# Patient Record
Sex: Male | Born: 1945 | Race: White | Hispanic: No | Marital: Married | Smoking: Former smoker
Health system: Southern US, Community
[De-identification: ages and names within clinical notes are randomized; demographics above are authoritative.]

## PROBLEM LIST (undated history)

## (undated) DIAGNOSIS — I639 Cerebral infarction, unspecified: Secondary | ICD-10-CM

## (undated) DIAGNOSIS — I10 Essential (primary) hypertension: Secondary | ICD-10-CM

## (undated) DIAGNOSIS — Z87442 Personal history of urinary calculi: Secondary | ICD-10-CM

## (undated) DIAGNOSIS — K869 Disease of pancreas, unspecified: Secondary | ICD-10-CM

## (undated) DIAGNOSIS — Z9181 History of falling: Secondary | ICD-10-CM

## (undated) DIAGNOSIS — K219 Gastro-esophageal reflux disease without esophagitis: Secondary | ICD-10-CM

## (undated) DIAGNOSIS — E119 Type 2 diabetes mellitus without complications: Secondary | ICD-10-CM

## (undated) DIAGNOSIS — I6330 Cerebral infarction due to thrombosis of unspecified cerebral artery: Secondary | ICD-10-CM

## (undated) DIAGNOSIS — Z9889 Other specified postprocedural states: Secondary | ICD-10-CM

## (undated) DIAGNOSIS — R112 Nausea with vomiting, unspecified: Secondary | ICD-10-CM

## (undated) DIAGNOSIS — S42309A Unspecified fracture of shaft of humerus, unspecified arm, initial encounter for closed fracture: Secondary | ICD-10-CM

## (undated) DIAGNOSIS — R2689 Other abnormalities of gait and mobility: Secondary | ICD-10-CM

## (undated) HISTORY — DX: Cerebral infarction due to thrombosis of unspecified cerebral artery: I63.30

## (undated) HISTORY — PX: CATARACT EXTRACTION: SUR2

## (undated) HISTORY — PX: OTHER SURGICAL HISTORY: SHX169

## (undated) HISTORY — PX: HYDROCELE EXCISION: SHX482

## (undated) HISTORY — DX: Cerebral infarction, unspecified: I63.9

## (undated) HISTORY — PX: HERNIA REPAIR: SHX51

---

## 2010-09-14 DIAGNOSIS — I639 Cerebral infarction, unspecified: Secondary | ICD-10-CM

## 2010-09-14 HISTORY — DX: Cerebral infarction, unspecified: I63.9

## 2011-03-31 ENCOUNTER — Inpatient Hospital Stay (HOSPITAL_COMMUNITY)
Admission: RE | Admit: 2011-03-31 | Discharge: 2011-04-04 | DRG: 945 | Disposition: A | Discharge status: Home / Self Care | Payer: Medicare Other | Source: Other Acute Inpatient Hospital | Attending: Physical Medicine & Rehabilitation | Admitting: Physical Medicine & Rehabilitation

## 2011-03-31 DIAGNOSIS — Z8249 Family history of ischemic heart disease and other diseases of the circulatory system: Secondary | ICD-10-CM

## 2011-03-31 DIAGNOSIS — E785 Hyperlipidemia, unspecified: Secondary | ICD-10-CM

## 2011-03-31 DIAGNOSIS — G8190 Hemiplegia, unspecified affecting unspecified side: Secondary | ICD-10-CM

## 2011-03-31 DIAGNOSIS — Z5189 Encounter for other specified aftercare: Secondary | ICD-10-CM

## 2011-03-31 DIAGNOSIS — IMO0001 Reserved for inherently not codable concepts without codable children: Secondary | ICD-10-CM

## 2011-03-31 DIAGNOSIS — R279 Unspecified lack of coordination: Secondary | ICD-10-CM

## 2011-03-31 DIAGNOSIS — Z87891 Personal history of nicotine dependence: Secondary | ICD-10-CM

## 2011-03-31 DIAGNOSIS — I10 Essential (primary) hypertension: Secondary | ICD-10-CM

## 2011-03-31 DIAGNOSIS — Z7982 Long term (current) use of aspirin: Secondary | ICD-10-CM

## 2011-03-31 DIAGNOSIS — E1165 Type 2 diabetes mellitus with hyperglycemia: Secondary | ICD-10-CM

## 2011-03-31 DIAGNOSIS — I6330 Cerebral infarction due to thrombosis of unspecified cerebral artery: Secondary | ICD-10-CM

## 2011-03-31 DIAGNOSIS — Z79899 Other long term (current) drug therapy: Secondary | ICD-10-CM

## 2011-03-31 DIAGNOSIS — E119 Type 2 diabetes mellitus without complications: Secondary | ICD-10-CM

## 2011-03-31 DIAGNOSIS — I6350 Cerebral infarction due to unspecified occlusion or stenosis of unspecified cerebral artery: Secondary | ICD-10-CM

## 2011-03-31 LAB — URINE MICROSCOPIC-ADD ON

## 2011-03-31 LAB — URINALYSIS, ROUTINE W REFLEX MICROSCOPIC
Bilirubin Urine: NEGATIVE
Glucose, UA: >1000 mg/dL — AB
Hgb urine dipstick: NEGATIVE
Ketones, ur: NEGATIVE mg/dL
Leukocytes, UA: NEGATIVE
Nitrite: NEGATIVE
Protein, ur: NEGATIVE mg/dL
Specific Gravity, Urine: 1.031 — ABNORMAL HIGH (ref 1.005–1.030)
Urobilinogen, UA: 0.2 mg/dL (ref 0.0–1.0)
pH: 5.5 (ref 5.0–8.0)

## 2011-03-31 LAB — GLUCOSE, CAPILLARY
Glucose-Capillary: 196 mg/dL — ABNORMAL HIGH (ref 70–99)
Glucose-Capillary: 159 mg/dL — ABNORMAL HIGH (ref 70–99)

## 2011-03-31 LAB — MRSA PCR SCREENING: MRSA by PCR: NEGATIVE

## 2011-04-01 DIAGNOSIS — I10 Essential (primary) hypertension: Secondary | ICD-10-CM

## 2011-04-01 DIAGNOSIS — IMO0001 Reserved for inherently not codable concepts without codable children: Secondary | ICD-10-CM

## 2011-04-01 DIAGNOSIS — E1165 Type 2 diabetes mellitus with hyperglycemia: Secondary | ICD-10-CM

## 2011-04-01 DIAGNOSIS — I6330 Cerebral infarction due to thrombosis of unspecified cerebral artery: Secondary | ICD-10-CM

## 2011-04-01 DIAGNOSIS — Z5189 Encounter for other specified aftercare: Secondary | ICD-10-CM

## 2011-04-01 LAB — DIFFERENTIAL
Basophils Absolute: 0 10*3/uL (ref 0.0–0.1)
Basophils Relative: 1 % (ref 0–1)
Eosinophils Absolute: 0.2 10*3/uL (ref 0.0–0.7)
Eosinophils Relative: 3 % (ref 0–5)
Lymphocytes Relative: 23 % (ref 12–46)
Lymphs Abs: 1.6 10*3/uL (ref 0.7–4.0)
Monocytes Absolute: 0.6 10*3/uL (ref 0.1–1.0)
Monocytes Relative: 8 % (ref 3–12)
Neutro Abs: 4.6 10*3/uL (ref 1.7–7.7)
Neutrophils Relative %: 66 % (ref 43–77)

## 2011-04-01 LAB — URINE CULTURE
Colony Count: NO GROWTH
Culture  Setup Time: 201207180116
Culture: NO GROWTH

## 2011-04-01 LAB — COMPREHENSIVE METABOLIC PANEL
ALT: 22 U/L (ref 0–53)
AST: 18 U/L (ref 0–37)
Albumin: 3.2 g/dL — ABNORMAL LOW (ref 3.5–5.2)
Alkaline Phosphatase: 69 U/L (ref 39–117)
BUN: 13 mg/dL (ref 6–23)
CO2: 26 mEq/L (ref 19–32)
Calcium: 8.9 mg/dL (ref 8.4–10.5)
Chloride: 106 mEq/L (ref 96–112)
Creatinine, Ser: 0.84 mg/dL (ref 0.50–1.35)
GFR calc Af Amer: >60 mL/min (ref >60)
GFR calc non Af Amer: >60 mL/min (ref >60)
Glucose, Bld: 122 mg/dL — ABNORMAL HIGH (ref 70–99)
Potassium: 3.4 mEq/L — ABNORMAL LOW (ref 3.5–5.1)
Sodium: 140 mEq/L (ref 135–145)
Total Bilirubin: 0.3 mg/dL (ref 0.3–1.2)
Total Protein: 6.5 g/dL (ref 6.0–8.3)

## 2011-04-01 LAB — GLUCOSE, CAPILLARY
Glucose-Capillary: 127 mg/dL — ABNORMAL HIGH (ref 70–99)
Glucose-Capillary: 224 mg/dL — ABNORMAL HIGH (ref 70–99)
Glucose-Capillary: 141 mg/dL — ABNORMAL HIGH (ref 70–99)
Glucose-Capillary: 144 mg/dL — ABNORMAL HIGH (ref 70–99)

## 2011-04-01 LAB — CBC
HCT: 40.1 % (ref 39.0–52.0)
Hemoglobin: 14.2 g/dL (ref 13.0–17.0)
MCH: 29.1 pg (ref 26.0–34.0)
MCHC: 35.4 g/dL (ref 30.0–36.0)
MCV: 82.2 fL (ref 78.0–100.0)
Platelets: 204 10*3/uL (ref 150–400)
RBC: 4.88 MIL/uL (ref 4.22–5.81)
RDW: 13.5 % (ref 11.5–15.5)
WBC: 7.1 10*3/uL (ref 4.0–10.5)

## 2011-04-02 LAB — HEPATIC FUNCTION PANEL
ALT: 19 U/L (ref 0–53)
AST: 15 U/L (ref 0–37)
Albumin: 3.5 g/dL (ref 3.5–5.2)
Alkaline Phosphatase: 72 U/L (ref 39–117)
Bilirubin, Direct: <0.1 mg/dL (ref 0.0–0.3)
Total Bilirubin: 0.3 mg/dL (ref 0.3–1.2)
Total Protein: 6.9 g/dL (ref 6.0–8.3)

## 2011-04-02 LAB — GLUCOSE, CAPILLARY
Glucose-Capillary: 139 mg/dL — ABNORMAL HIGH (ref 70–99)
Glucose-Capillary: 198 mg/dL — ABNORMAL HIGH (ref 70–99)
Glucose-Capillary: 148 mg/dL — ABNORMAL HIGH (ref 70–99)
Glucose-Capillary: 123 mg/dL — ABNORMAL HIGH (ref 70–99)

## 2011-04-03 DIAGNOSIS — IMO0001 Reserved for inherently not codable concepts without codable children: Secondary | ICD-10-CM

## 2011-04-03 DIAGNOSIS — Z5189 Encounter for other specified aftercare: Secondary | ICD-10-CM

## 2011-04-03 DIAGNOSIS — E1165 Type 2 diabetes mellitus with hyperglycemia: Secondary | ICD-10-CM

## 2011-04-03 DIAGNOSIS — I6330 Cerebral infarction due to thrombosis of unspecified cerebral artery: Secondary | ICD-10-CM

## 2011-04-03 DIAGNOSIS — I10 Essential (primary) hypertension: Secondary | ICD-10-CM

## 2011-04-03 LAB — GLUCOSE, CAPILLARY
Glucose-Capillary: 105 mg/dL — ABNORMAL HIGH (ref 70–99)
Glucose-Capillary: 201 mg/dL — ABNORMAL HIGH (ref 70–99)
Glucose-Capillary: 101 mg/dL — ABNORMAL HIGH (ref 70–99)
Glucose-Capillary: 146 mg/dL — ABNORMAL HIGH (ref 70–99)

## 2011-04-04 DIAGNOSIS — I6330 Cerebral infarction due to thrombosis of unspecified cerebral artery: Secondary | ICD-10-CM

## 2011-04-04 DIAGNOSIS — E1165 Type 2 diabetes mellitus with hyperglycemia: Secondary | ICD-10-CM

## 2011-04-04 DIAGNOSIS — I10 Essential (primary) hypertension: Secondary | ICD-10-CM

## 2011-04-04 DIAGNOSIS — Z5189 Encounter for other specified aftercare: Secondary | ICD-10-CM

## 2011-04-04 DIAGNOSIS — IMO0001 Reserved for inherently not codable concepts without codable children: Secondary | ICD-10-CM

## 2011-04-04 LAB — GLUCOSE, CAPILLARY
Glucose-Capillary: 128 mg/dL — ABNORMAL HIGH (ref 70–99)
Glucose-Capillary: 144 mg/dL — ABNORMAL HIGH (ref 70–99)

## 2011-04-08 ENCOUNTER — Ambulatory Visit: Payer: Medicare Other | Attending: Physical Medicine & Rehabilitation | Admitting: Physical Therapy

## 2011-04-08 DIAGNOSIS — R5381 Other malaise: Secondary | ICD-10-CM | POA: Insufficient documentation

## 2011-04-08 DIAGNOSIS — R262 Difficulty in walking, not elsewhere classified: Secondary | ICD-10-CM | POA: Insufficient documentation

## 2011-04-08 DIAGNOSIS — IMO0001 Reserved for inherently not codable concepts without codable children: Secondary | ICD-10-CM | POA: Insufficient documentation

## 2011-04-08 DIAGNOSIS — I69998 Other sequelae following unspecified cerebrovascular disease: Secondary | ICD-10-CM | POA: Insufficient documentation

## 2011-04-09 ENCOUNTER — Ambulatory Visit: Payer: Medicare Other | Admitting: Rehabilitation

## 2011-04-10 NOTE — Discharge Summary (Signed)
NAME:  [name] Soto, [name] Soto NO.:  [id number]  MEDICAL RECORD NO.:  [id number]  LOCATION:  4149                         FACILITY:  [facility]  PHYSICIAN:  Erick Colace, M.D.DATE OF BIRTH:  1946/08/16  DATE OF ADMISSION:  03/31/2011 DATE OF DISCHARGE:                              DISCHARGE SUMMARY   DISCHARGE DIAGNOSES: 1. Right cerebrovascular accident. 2. Diabetes mellitus type 2. 3. Hypertension. 4. Dyslipidemia.  HISTORY OF PRESENT ILLNESS:  [name] Soto is a 65 year old male with history of prior back surgery, otherwise in good health, admitted to [facility] on July 12 with dizziness, left-sided weakness, and speech difficulties.  CT of head done showed chronic infarct right basal ganglia and small vessel disease.  An echo done showed EF of 70-75% with moderate LVH, mild aortic sclerosis.  MRI of brain showed small acute infarct in posterior limb right internal capsule, right thalamus and periventricular white matter, moderate brain atrophy and microvascular changes.  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  MRA of brain was negative for stenosis. The patient was started on aspirin for stroke prophylaxis.  He was noted to have hemoglobin A1c of 10.5, as well as dyslipidemia.  He was started on Lantus and oral agents for diabetes management.  He currently continues with ataxia.  Rehab team was asked to evaluate this patient and we felt that he would benefit from a CIR program.  PAST MEDICAL HISTORY:  Significant for staph infection in left elbow requiring I and D in the past few months and hydrocele repair.  ALLERGIES:  No known drug allergies.  FAMILY HISTORY:  Positive for coronary artery disease and cancer.  SOCIAL HISTORY:  The patient is married, lives in 1-level home with two plus one step at entry, has a remote history of tobacco use.  Does not use any alcohol.  Wife is  supportive.  PHYSICAL EXAMINATION:  GENERAL:  The patient is a well-nourished, well- developed male, alert in no acute distress. HEENT:  Pupils equal, round, and reactive to light.  Oral mucosa is pink and moist with borderline dentition.  Hearing intact.  Nares patent. NECK:  Supple without JVD or lymphadenopathy. HEART:  Regular rate and rhythm without murmurs, gallops, or rubs. EXTREMITIES:  No clubbing, cyanosis, or edema. LUNGS:  Clear to auscultation bilaterally without wheezes, rales, or rhonchi. ABDOMEN:  Soft and nontender.  Bowel sounds positive. SKIN:  Without signs of acute breakdown. NEUROLOGIC:  Notable for cranial nerve VII abnormalities as well as mild left 12th nerve abnormality.  The patient with intact sensation. Extraocular movements intact.  Reflexes 1+.  The patient has decreased fine motor movement on left upper and left lower extremity.  Mild left pronator drift.  Strength is 4/5 grossly left upper proximal and distal with some variance and 4 to 4+/5 left lower extremity.  Right upper and lower extremity are grossly 5/5.  Judgment, orientation, memory, and mood are all appropriate.  HOSPITAL COURSE:  [name] Soto was admitted to rehab on March 31, 2011, for inpatient therapies to consist of PT, OT, and speech therapy at least 3 hours 5 days  a week.  Past admission, physiatrist, rehab RN, and therapy team have worked together to provide customized collaborative interdisciplinary care.  Rehab RN has worked with the patient on bowel and bladder program as well as safety plan.  The patient was educated regarding carb-modified diet and diabetes monitoring.  The patient's blood pressures were checked on b.i.d. basis. These were noted to range from 120s to 150s systolic and 60s to 70s diastolic.  Initially, the patient was maintained on Lantus insulin plus metformin and Actos for diabetes management.  Lantus was discontinued 24 hours past admission.  CBGs have been  checked on a.c. and at bedtime basis.  Currently, blood sugars are ranging from 102 to 200 range.  The patient has been advised to check blood sugars on a.c. and at bedtime basis and further changes in his diabetes management to be done by primary care physician.  Labs done past admission revealed hemoglobin 14.2, hematocrit 40.1, white count 7.1, and platelets 204.  Check of lytes revealed sodium 140, potassium 3.4, chloride 106, CO2 of 26, BUN 13, creatinine 0.84, and glucose 122.  LFTs revealed total bili 0.3, direct bili 0.1, alkaline phos 72, SGOT 15, SGPT 19, and albumin 3.5. The patient's p.o. intake has been good.  He has been continent of bowel and bladder.  At admission, the patient was noted to have delayed balance reaction with impaired hip and ankle strategies, high fall risk with Berg score at 29 out of 56.  The patient has made good progress and has progressed from moderate assist at eval to modified independent level for all mobility.  OT has worked with the patient on self-care tasks.  The patient initially required close supervision for ADLs and min assist for ADL transfers.  He is currently at modified independent level for all ADL tasks.  Speech Therapy has worked with the patient on high-level complex information processing as well as safety.  Currently, the patient is modified independent for all tasks and no further speech therapy is needed.  The patient has been set up for further follow up outpatient physical therapy to continue at [facility] past discharge.  On April 04, 2011, the patient is discharged to home.  DISCHARGE MEDICATIONS: 1. Aspirin 81 mg p.o. per day. 2. Lipitor 40 mg p.o. at bedtime. 3. Metformin 1000mg  in am and 500 mg in pm.  DIET:  Carb-modified medium.  ACTIVITY LEVEL:  Intermittent supervision.  SPECIAL INSTRUCTIONS:  No strenuous activity.  No alcohol.  No smoking. No driving.  Check blood sugars on t.i.d. to q.i.d. basis.   Follow up with primary MD for medical issues in the next couple of weeks.  Follow up with Dr. Wynn Banker, August 21 at 12:15 for 12:45 appointment.     Delle Reining, P.A.   ______________________________ Erick Colace, M.D.    PL/MEDQ  D:  04/03/2011  T:  04/04/2011  Job:  [id number]  cc:   Doreatha Martin, M.D.  Electronically Signed by Osvaldo Shipper. on 04/06/2011 05:35:14 PM Electronically Signed by Claudette Laws M.D. on 04/10/2011 11:21:39 AM

## 2011-04-13 ENCOUNTER — Ambulatory Visit: Payer: Medicare Other | Admitting: Rehabilitation

## 2011-04-15 ENCOUNTER — Ambulatory Visit: Payer: Medicare Other | Attending: Physical Medicine & Rehabilitation | Admitting: Rehabilitation

## 2011-04-15 DIAGNOSIS — R5381 Other malaise: Secondary | ICD-10-CM | POA: Insufficient documentation

## 2011-04-15 DIAGNOSIS — I69998 Other sequelae following unspecified cerebrovascular disease: Secondary | ICD-10-CM | POA: Insufficient documentation

## 2011-04-15 DIAGNOSIS — IMO0001 Reserved for inherently not codable concepts without codable children: Secondary | ICD-10-CM | POA: Insufficient documentation

## 2011-04-15 DIAGNOSIS — R262 Difficulty in walking, not elsewhere classified: Secondary | ICD-10-CM | POA: Insufficient documentation

## 2011-04-17 ENCOUNTER — Ambulatory Visit: Payer: Medicare Other | Admitting: Rehabilitation

## 2011-04-20 ENCOUNTER — Ambulatory Visit: Payer: Medicare Other | Admitting: Physical Therapy

## 2011-04-22 ENCOUNTER — Ambulatory Visit: Payer: Medicare Other | Admitting: Rehabilitation

## 2011-04-24 ENCOUNTER — Ambulatory Visit: Payer: Medicare Other | Admitting: Physical Therapy

## 2011-04-27 ENCOUNTER — Ambulatory Visit: Payer: Medicare Other | Admitting: Rehabilitation

## 2011-04-28 NOTE — H&P (Signed)
NAME:  [name] Soto, [name] Soto NO.:  [id number]  MEDICAL RECORD NO.:  [id number]  LOCATION:  4149                         FACILITY:  [facility]  PHYSICIAN:  Ranelle Oyster, M.D.DATE OF BIRTH:  September 21, 1945  DATE OF ADMISSION:  03/31/2011 DATE OF DISCHARGE:                             HISTORY & PHYSICAL   CHIEF COMPLAINTS:  Left-sided weakness and dis-coordination.  The patient has no family physician.  HISTORY OF PRESENT ILLNESS:  This is a pleasant 65 year old white male with prior back surgery, but otherwise no known history.  Admitted to [facility] on March 26, 2011, while vacationing with dizziness, left-sided weakness, and speech problems.  CT showed chronic infarct in the right basal ganglia and small vessel disease.  Echo showed ejection fraction 75%, no other obvious abnormalities.  MRI of the brain shows small acute infarct in the posterior limb of the right internal capsule into the right thalamic and periventricular white matter area, moderate brain atrophy, and microvascular changes.  Carotid Dopplers were notable for 50% right ICA stenosis at origin and 30% stenosis left ICA.  MRI of the brain was negative for other stenosis.  The patient was started on aspirin for stroke prophylaxis.  He was found to have a hemoglobin A1c of 10.5 and hypertensive.  He has been started on insulin and oral agents for management.  The Rehab Team was asked to evaluate this patient and felt he could benefit from inpatient rehab stay after reviewing his medical and functional information.  REVIEW OF SYSTEMS:  Notable for weakness on the left arm and leg as well as decreased balance.  He is showing improvement, however.  Denies any nausea, vomiting, or problems with bowels or bladder.  His appetite is good.  Other pertinent positives are in the history of present illness.  PAST MEDICAL HISTORY:  Notable for staph infection of left elbow and hydrocele repair.  FAMILY  HISTORY:  Positive for CAD and cancer, otherwise negative.  SOCIAL HISTORY:  The patient is married, lives in a 1-level house with 2+ 1 steps to enter.  He has remote tobacco use and does not drink. Wife is very supportive.  ALLERGIES:  None.  HOME MEDICATIONS:  B12 and multivitamin.  LABORATORY DATA:  Hemoglobin 14, white count 8.7, and platelets 206. Sodium 137, potassium 3.9, BUN 14, and creatinine 0.8.  Cholesterol 193, HDL 42, LDL 128, and triglycerides 114.  PHYSICAL EXAMINATION:  GENERAL:  The patient is pleasant, alert, sitting in the chair, in no acute stress. HEENT:  Pupils are equal, round, and reactive to light.  Ear, nos, and throat exam is notable for borderline dentition and pink moist mucosa. NECK:  Supple without JVD or lymphadenopathy. HEART:  Regular rate and rhythm without murmurs, rubs, or gallops. EXTREMITIES:  No clubbing, cyanosis, or edema. CHEST:  Clear to auscultation bilaterally without wheezes, rales, or rhonchi. ABDOMEN:  Soft and nontender.  Bowel sounds were positive. SKIN:  Notable for no signs of acute breakdown. NEUROLOGICAL:  Notable for left cranial nerve VII abnormalities as well as mild left XII nerve abnormality.  He had intact sensation in all extraocular eye movements.  Reflexes are 1+.  Sensation is  intact in all 4 limbs.  The patient had decreased fine motor movement in the left upper and lower extremity today.  He had a mild left pronator drift. Strength is 4/5 grossly in left upper extremity, proximal and distal with some variants and 4 to 4+/5 left lower extremity.  Right upper and right lower extremity grossly 5/5.  Judgment, orientation, memory, and mood were all appropriate.  POSTADMISSION PHYSICIAN EVALUATION: 1. Functional deficits secondary to right posterior limb and right     thalamic infarcts, mild left-sided weakness, gait instability, and     also fine motor movements. 2. The patient is admitted to receive collaborative  interdisciplinary     care between the physiatrist, rehab nursing staff, and therapy     team. 3. The patient's level of medical complexity and substantial therapy     needs in context of that medical necessity cannot be provided at a     lesser intensity of care. 4. The patient has experienced substantial functional loss from his     baseline.  Premorbidly, he is dependent.  Currently, he is mod     assist standing and transfers, max assist ambulating 5 feet with     rolling walker, mod assist with ADLs.  Judging by the patient's     diagnosis, physical exam, and functional history, the patient has     displayed the ability to make functional progress which will result     in measurable gains while in inpatient rehab.  These gains will be     of substantial and practical use upon discharge to home in     facilitating mobility and self-care. 5. The physiatrist will provide 24-hour management of medical needs as     well as oversight of therapy plans/treatment and provide guidance     as appropriate regarding interaction of the two.  Medical problem     list and plan are below. 6. The 24-Hour Rehab Nursing Team will assist in management of the     patient's skin care needs as well as bowel and bladder function,     safety awareness, and integration of therapy concepts and     techniques. 7. PT will assess and treat for lower extremity strength, range of     motion, balance, adaptive techniques, equipment, and functional     mobility with goals modified independent. 8. OT will assess and treat for upper extremity use, ADLs, adaptive     techniques, equipment, functional mobility, and upper extremity     strength with goals modified independent. 9. Case Management and Social Worker will assess and treat for     psychosocial issues and discharge planning. 10.Team conference will be held weekly to assess progress towards     goals and to determine barriers at discharge. 11.The patient has  demonstrated sufficient medical stability and     exercise capacity to tolerate at least 3 hours of therapy per day     at least 5 days per week. 12.Estimated length of stay is 7 days.  The patient is making rapid     progress and his exam to me exceeds his prior functional reports in     the chart.  The patient is motivated to get home as soon as     possible as well.  MEDICAL PROBLEM LIST AND PLAN: 1. Deep vein thrombosis prophylaxis with subcu Lovenox until     ambulation improves significantly.  Follow platelets and look for     any  signs symptoms of bleeding complications. 2. Dyslipidemia:  Continue Lipitor and follow LFTs.  The patient needs     secondary stroke prophylaxis as well. 3. Type 2 diabetes:  Monitor the CBGs a.c. and at bedtime.  Follow     with metformin, Actos, and Lantus insulin.  Educate the patient     regarding diet and stroke risk.  Check CBGs 4 times a day and cover     with sliding-scale     insulin as appropriate. 4. Hypertension:  The patient's blood pressure has been improving per     chart.  We will follow for trends with activity, pain, etc.     Monitor only for now on a b.i.d. schedule per rehab protocol.     Ranelle Oyster, M.D.     ZTS/MEDQ  D:  03/31/2011  T:  04/01/2011  Job:  [id number]  Electronically Signed by Faith Rogue M.D. on 04/28/2011 09:52:09 AM

## 2011-04-29 ENCOUNTER — Ambulatory Visit: Payer: Medicare Other | Admitting: Rehabilitation

## 2011-04-29 ENCOUNTER — Encounter: Payer: Medicare Other | Admitting: Physical Therapy

## 2011-05-01 ENCOUNTER — Ambulatory Visit: Payer: Medicare Other | Admitting: Rehabilitation

## 2011-05-04 ENCOUNTER — Ambulatory Visit: Payer: Medicare Other | Admitting: Rehabilitation

## 2011-05-05 ENCOUNTER — Inpatient Hospital Stay: Payer: Medicare Other | Admitting: Physical Medicine & Rehabilitation

## 2011-05-05 ENCOUNTER — Encounter: Payer: Medicare Other | Attending: Physical Medicine & Rehabilitation

## 2011-05-05 DIAGNOSIS — G8110 Spastic hemiplegia affecting unspecified side: Secondary | ICD-10-CM

## 2011-05-05 DIAGNOSIS — I69928 Other speech and language deficits following unspecified cerebrovascular disease: Secondary | ICD-10-CM | POA: Insufficient documentation

## 2011-05-05 DIAGNOSIS — R42 Dizziness and giddiness: Secondary | ICD-10-CM | POA: Insufficient documentation

## 2011-05-05 DIAGNOSIS — I6330 Cerebral infarction due to thrombosis of unspecified cerebral artery: Secondary | ICD-10-CM

## 2011-05-05 DIAGNOSIS — I69959 Hemiplegia and hemiparesis following unspecified cerebrovascular disease affecting unspecified side: Secondary | ICD-10-CM | POA: Insufficient documentation

## 2011-05-05 NOTE — Assessment & Plan Note (Signed)
HISTORY:  A 65 year old male who had a right subcortical infarct, internal capsule thalamus infarct noted MRI on July 12 after complaints of dizziness, left-sided weakness, and speech problems.  He had additional workup revealing no significant carotid artery stenosis.  MRA of the brain was negative for any type of intracranial stenosis.  Echo showed no cardioembolic source.  He did have an hemoglobin A1c of 10.5, evidence of elevated cholesterol level.  Only spend about 4 days on the inpatient unit.  MEDICATIONS: 1. Aspirin 81 mg per day. 2. Lisinopril 5 mg a day. 3. Atorvastatin 40 mg a day. 4. Metformin ER 500 mg 2  p.o. daily.  The patient has been employed 40 hours a week, but self employed.  He walk 5 minutes at a time, he climb steps, he is not driving yet.  He is going to therapy at the [facility].  SOCIAL HISTORY:  Married, lives with spouse.  PHYSICAL EXAMINATION:  VITAL SIGNS:  Blood pressure 162/91, pulse 72, respirations 16, and O2 sat 99% on room air. GENERAL:  No acute stress.  Mood and affect appropriate.  Ambulates with a cane. EXTREMITIES:  He has 4/5 strength in the left lower extremity and 5- in the left upper extremity, 5/5 on the right side.  Sensation is normal in the upper and lower extremities. NEUROLOGIC:  He has cranial nerves II-XII intact.  Visual fields are intact to confrontation testing.  Gait is mildly wide-based with a cane. No evidence toe drag or knee instability.  IMPRESSION: 1. Right subcortical infarct with some mild left hemiparesis,     primarily affecting lower extremity and upper extremity.  No     significant sensory deficits.  No visual field deficits. 2. I think he can continue his outpatient therapy until he plateaus.     I will see him back in one more month to reevaluate. 3. He can restart driving on a limited basis.  Start out in a parking     lot with another licensed driver make sure he does well in that  setting.  Then, progress to a small side street without much     traffic and if he does okay with that, he can go on a more     congested street.  I would not do any nighttime driving or     interstate driving to reevaluate him in 1 month.  Discussed with     the patient and agrees to plan.     Erick Colace, M.D. Electronically Signed    AEK/MedQ D:  05/05/2011 13:24:34  T:  05/05/2011 17:48:00  Job #:  [id number]  cc:   Doreatha Martin, M.D.

## 2011-05-06 ENCOUNTER — Ambulatory Visit: Payer: Medicare Other | Admitting: Rehabilitation

## 2011-05-08 ENCOUNTER — Ambulatory Visit: Payer: Medicare Other | Admitting: Rehabilitation

## 2011-05-11 ENCOUNTER — Ambulatory Visit: Payer: Medicare Other | Admitting: Physical Therapy

## 2011-05-13 ENCOUNTER — Ambulatory Visit: Payer: Medicare Other | Admitting: Rehabilitation

## 2011-05-15 ENCOUNTER — Ambulatory Visit: Payer: Medicare Other | Admitting: Rehabilitation

## 2011-05-19 ENCOUNTER — Ambulatory Visit: Payer: Medicare Other | Attending: Physical Medicine & Rehabilitation | Admitting: Rehabilitation

## 2011-05-19 DIAGNOSIS — R5381 Other malaise: Secondary | ICD-10-CM | POA: Insufficient documentation

## 2011-05-19 DIAGNOSIS — R262 Difficulty in walking, not elsewhere classified: Secondary | ICD-10-CM | POA: Insufficient documentation

## 2011-05-19 DIAGNOSIS — IMO0001 Reserved for inherently not codable concepts without codable children: Secondary | ICD-10-CM | POA: Insufficient documentation

## 2011-05-19 DIAGNOSIS — I69998 Other sequelae following unspecified cerebrovascular disease: Secondary | ICD-10-CM | POA: Insufficient documentation

## 2011-05-20 ENCOUNTER — Ambulatory Visit: Payer: Medicare Other | Admitting: Rehabilitation

## 2011-05-22 ENCOUNTER — Ambulatory Visit: Payer: Medicare Other | Admitting: Rehabilitation

## 2011-05-25 ENCOUNTER — Ambulatory Visit: Payer: Medicare Other | Admitting: Physical Therapy

## 2011-05-27 ENCOUNTER — Ambulatory Visit: Payer: Medicare Other | Admitting: Rehabilitation

## 2011-05-29 ENCOUNTER — Ambulatory Visit: Payer: Medicare Other | Admitting: Rehabilitation

## 2011-06-01 ENCOUNTER — Ambulatory Visit: Payer: Medicare Other | Admitting: Physical Therapy

## 2011-06-02 ENCOUNTER — Encounter: Payer: Medicare Other | Attending: Physical Medicine & Rehabilitation

## 2011-06-02 ENCOUNTER — Ambulatory Visit: Payer: Medicare Other | Admitting: Physical Medicine & Rehabilitation

## 2011-06-02 DIAGNOSIS — R42 Dizziness and giddiness: Secondary | ICD-10-CM | POA: Insufficient documentation

## 2011-06-02 DIAGNOSIS — I6330 Cerebral infarction due to thrombosis of unspecified cerebral artery: Secondary | ICD-10-CM

## 2011-06-02 DIAGNOSIS — I69959 Hemiplegia and hemiparesis following unspecified cerebrovascular disease affecting unspecified side: Secondary | ICD-10-CM | POA: Insufficient documentation

## 2011-06-02 DIAGNOSIS — I69928 Other speech and language deficits following unspecified cerebrovascular disease: Secondary | ICD-10-CM | POA: Insufficient documentation

## 2011-06-02 NOTE — Assessment & Plan Note (Signed)
REASON FOR VISIT:  Stroke with left-sided weakness, stroke onset was in July.  He has completed inpatient rehab.  He has been going through outpatient rehab.  He was asked to get a knee hyperextension brace.  He states he only occasionally hyperextends usually after a fatigue, I was doing calf raises with one leg and occasionally just from regular ambulation, but this is certainly not with every step.  He has no pain in the back of the knee or anywhere else for that matter.  He follows up with Dr. Doreatha Martin.  PHYSICAL EXAMINATION:  GENERAL:  He has no appreciable weakness to manual muscle testing in bilateral upper and lower extremities.  His gait, however, does show some hip hiking.  He is rather deliberate in doing his ankle dorsiflexion.  It shows no signs of knee instability. Certainly no hyperextension noted.  He does have a stiff-legged gait on the left side.  IMPRESSION:  Cerebrovascular accident with visual ligate deficits mainly stiff-legged gait, hyperextension is occasional.  PLAN: 1. I do not recommend a hyperextension brace. 2. Continue PT for one more month.  I will reevaluate. 3. Elevated blood pressure 161/92.  He sees Dr. Alwyn Ren, next week.     She may need to adjust his blood pressure medicine.     Erick Colace, M.D. Electronically Signed    AEK/MedQ D:  06/02/2011 12:55:46  T:  06/02/2011 15:47:21  Job #:  [id number]  cc:   Doreatha Martin, M.D.

## 2011-06-03 ENCOUNTER — Ambulatory Visit: Payer: Medicare Other | Admitting: Physical Therapy

## 2011-06-05 ENCOUNTER — Ambulatory Visit: Payer: Medicare Other | Admitting: Physical Therapy

## 2011-06-08 ENCOUNTER — Ambulatory Visit: Payer: Medicare Other | Admitting: Physical Therapy

## 2011-06-10 ENCOUNTER — Ambulatory Visit: Payer: Medicare Other | Admitting: Physical Therapy

## 2011-06-12 ENCOUNTER — Ambulatory Visit: Payer: Medicare Other | Admitting: Physical Therapy

## 2011-06-15 ENCOUNTER — Ambulatory Visit: Payer: Medicare Other | Attending: Physical Medicine & Rehabilitation | Admitting: Physical Therapy

## 2011-06-15 DIAGNOSIS — I69998 Other sequelae following unspecified cerebrovascular disease: Secondary | ICD-10-CM | POA: Insufficient documentation

## 2011-06-15 DIAGNOSIS — IMO0001 Reserved for inherently not codable concepts without codable children: Secondary | ICD-10-CM | POA: Insufficient documentation

## 2011-06-15 DIAGNOSIS — R262 Difficulty in walking, not elsewhere classified: Secondary | ICD-10-CM | POA: Insufficient documentation

## 2011-06-15 DIAGNOSIS — R5381 Other malaise: Secondary | ICD-10-CM | POA: Insufficient documentation

## 2011-06-17 ENCOUNTER — Ambulatory Visit: Payer: Medicare Other | Admitting: Physical Therapy

## 2011-06-19 ENCOUNTER — Ambulatory Visit: Payer: Medicare Other | Admitting: Physical Therapy

## 2011-06-22 ENCOUNTER — Ambulatory Visit: Payer: Medicare Other | Admitting: Physical Therapy

## 2011-06-24 ENCOUNTER — Ambulatory Visit: Payer: Medicare Other | Admitting: Physical Therapy

## 2011-06-25 ENCOUNTER — Encounter: Payer: Medicare Other | Attending: Physical Medicine & Rehabilitation

## 2011-06-25 ENCOUNTER — Ambulatory Visit: Payer: Medicare Other | Admitting: Physical Medicine & Rehabilitation

## 2011-06-25 DIAGNOSIS — I69959 Hemiplegia and hemiparesis following unspecified cerebrovascular disease affecting unspecified side: Secondary | ICD-10-CM | POA: Insufficient documentation

## 2011-06-25 DIAGNOSIS — I69928 Other speech and language deficits following unspecified cerebrovascular disease: Secondary | ICD-10-CM | POA: Insufficient documentation

## 2011-06-25 DIAGNOSIS — R42 Dizziness and giddiness: Secondary | ICD-10-CM | POA: Insufficient documentation

## 2011-06-25 DIAGNOSIS — G8110 Spastic hemiplegia affecting unspecified side: Secondary | ICD-10-CM

## 2011-06-25 NOTE — Assessment & Plan Note (Signed)
HISTORY:  Onset March 26, 2011, right basal ganglia infarct.  Echo moderate LVH otherwise no clots.  MRI showed a small PLIC infarct, right thalamic infarct, some microvascular changes.  No significant ICA stenosis noted on Doppler of the neck.  Hemoglobin A1c was elevated at 10.5, repeated recently at Dr. Davonna Belling office.  His cholesterol level LDL, HDL were all normal.  He continues with outpatient therapy 3 times a week.  BERG balance has been stable.  Dynamic gait index is 18-24.  I do not see a recheck.  He still reports some problems in crowds.  He still uses a cane outside the house.  PHYSICAL EXAMINATION:  He has a widened base of support.  He has 4+/5 strength in the left hip flexor, knee extensor, ankle dorsiflexor, 5/5 on the right side.  Left upper extremity 4+ right side 5.  He has good finger to thumb opposition bilaterally in the upper extremities.  No evidence of dysdiadochokinesis on rapid alternating movement.  He is unable to walk more than a couple steps on his toes.  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  He is expected to plateau in the next 3-6 months in terms of neurologic recovery.  He still may improve from a functional standpoint with additional therapy.  I have extended his physical therapy by 2 weeks.  I will see him back in about 6 weeks.  I have therapy reassessed dynamic gait index to monitor improvements and continues cane.  ADDENDUM:  The patient has been driving again.  No problems with this.     Erick Colace, M.D. Electronically Signed    AEK/MedQ D:  06/25/2011 12:51:15  T:  06/25/2011 13:25:29  Job #:  [id number]  cc:   Doreatha Martin, M.D.

## 2011-06-26 ENCOUNTER — Ambulatory Visit: Payer: Medicare Other | Admitting: Physical Therapy

## 2011-06-29 ENCOUNTER — Ambulatory Visit: Payer: Medicare Other | Admitting: Physical Therapy

## 2011-07-01 ENCOUNTER — Ambulatory Visit: Payer: Medicare Other | Admitting: Physical Therapy

## 2011-07-03 ENCOUNTER — Ambulatory Visit: Payer: Medicare Other | Admitting: Physical Therapy

## 2011-07-06 ENCOUNTER — Ambulatory Visit: Payer: Medicare Other | Admitting: Physical Therapy

## 2011-07-08 ENCOUNTER — Ambulatory Visit: Payer: Medicare Other | Admitting: Physical Therapy

## 2011-07-10 ENCOUNTER — Ambulatory Visit: Payer: Medicare Other | Admitting: Physical Therapy

## 2011-07-13 ENCOUNTER — Ambulatory Visit: Payer: Medicare Other | Admitting: Physical Therapy

## 2011-07-15 ENCOUNTER — Ambulatory Visit: Payer: Medicare Other | Admitting: Physical Therapy

## 2011-07-17 ENCOUNTER — Encounter: Payer: Medicare Other | Admitting: Physical Therapy

## 2011-08-04 ENCOUNTER — Encounter: Payer: Medicare Other | Attending: Physical Medicine & Rehabilitation

## 2011-08-04 ENCOUNTER — Ambulatory Visit: Payer: Medicare Other | Admitting: Physical Medicine & Rehabilitation

## 2011-08-04 DIAGNOSIS — I69928 Other speech and language deficits following unspecified cerebrovascular disease: Secondary | ICD-10-CM | POA: Insufficient documentation

## 2011-08-04 DIAGNOSIS — I69959 Hemiplegia and hemiparesis following unspecified cerebrovascular disease affecting unspecified side: Secondary | ICD-10-CM | POA: Insufficient documentation

## 2011-08-04 DIAGNOSIS — G8110 Spastic hemiplegia affecting unspecified side: Secondary | ICD-10-CM

## 2011-08-04 DIAGNOSIS — R42 Dizziness and giddiness: Secondary | ICD-10-CM | POA: Insufficient documentation

## 2011-08-04 NOTE — Assessment & Plan Note (Signed)
REASON FOR VISIT:  Left-sided weakness.  HISTORY:  Right basal ganglia infarct onset on March 26, 2011.  Echo showed moderate LVH, no clots.  MRI showed a small posterior limb internal capsule infarct, right thalamic infarcts, some microvascular changes.  Hemoglobin A1c was elevated 10.5.  His HDL and LDL were all normal.  He has finished up with his outpatient therapy.  PHYSICAL EXAMINATION:  EXTREMITIES: Widened base support.  4+/5 in the left hip flexor.  5/5 knee extensor, 4+/5 ankle dorsiflexor, 5/5 on the right side.  Left upper extremity is 4+/5, and on the right side 5/5.  He has normal finger thumb opposition bilaterally in upper extremity. No evidence of dysdiadochokinesis on rapid alternating movements of his left upper extremity.  He is unable to walk more than a few steps on his toes.  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  Expected plateaued in function 3-6 months in terms of neurological recovery.  I have encouraged him to continue working on the home exercise program.  The physical therapist has instructed on, this is going up on his toes, going up on his heels, doing grading exercises as well as tandem gait. I will reassess him in 2 months, at which time he will be about 6 months post.  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  Discussed with the patient, agrees with plan.     Erick Colace, M.D. Electronically Signed    AEK/MedQ D:  08/04/2011 10:36:09  T:  08/04/2011 11:09:42  Job #:  [id number]  cc:   Doreatha Martin, M.D.

## 2011-09-29 ENCOUNTER — Ambulatory Visit: Payer: Medicare Other | Admitting: Physical Medicine & Rehabilitation

## 2011-09-29 ENCOUNTER — Encounter: Payer: Medicare Other | Attending: Physical Medicine & Rehabilitation

## 2011-09-29 DIAGNOSIS — I69959 Hemiplegia and hemiparesis following unspecified cerebrovascular disease affecting unspecified side: Secondary | ICD-10-CM | POA: Insufficient documentation

## 2011-09-29 DIAGNOSIS — I69928 Other speech and language deficits following unspecified cerebrovascular disease: Secondary | ICD-10-CM | POA: Insufficient documentation

## 2011-09-29 DIAGNOSIS — I6330 Cerebral infarction due to thrombosis of unspecified cerebral artery: Secondary | ICD-10-CM

## 2011-09-29 DIAGNOSIS — R42 Dizziness and giddiness: Secondary | ICD-10-CM | POA: Insufficient documentation

## 2011-09-29 NOTE — Assessment & Plan Note (Signed)
[name] Soto returns today.  He is a 66 year old male who had a CVA in July 2012.  Echo showed moderate LVH.  No source of embolism.  MRI showed a small posterior limb internal capsule infarct, right thalamic infarct.  Hemoglobin A1c was elevated at that time to 10.5.  His HDL and LDL were normal.  He has finished up outpatient therapy back in October. 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  He still uses a cane when he is in large crowds.  He reports his last hemoglobin A1c of 5.4.  REVIEW OF SYSTEMS:  Negative except as above.  SOCIAL HISTORY:  Married, lives with his spouse.  PHYSICAL EXAMINATION:  Walks with a widened base of support.  No evidence of toe drag or knee instability.  He has 5/5 strength bilateral hip flexors, knee extensors, ankle dorsiflexors, as well as bilateral upper extremity, deltoid, biceps, triceps, grip.  He has no evidence of dysdiadochokinesis on rapid alternating movements of the left upper extremity compared to the right.  He has normal thumb to finger opposition bilaterally.  Sensation is intact.  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  He is improved some with his strength back to normal phase, but still balance is a bit off.  I think he has had a plateau in his function. He needs to keep up with his activities as he is.  We discussed stroke prevention.  Main concern today is his elevated blood pressure 195/80.  We will recheck and if still elevated, we will fax result to Dr. Frederik Pear office, may need some further medication management.  The patient has managed to reduce his hemoglobin A1c from 10.5-5.4, and I commended him on this.  I will see the patient back on a p.r.n. basis.     Erick Colace, M.D. Electronically Signed    AEK/MedQ D:  09/29/2011 11:16:10  T:  09/29/2011 19:14:06  Job #:  [id number]  cc:   Doreatha Martin, M.D.

## 2015-06-26 ENCOUNTER — Other Ambulatory Visit: Payer: Self-pay | Admitting: Gastroenterology

## 2015-06-26 NOTE — Addendum Note (Signed)
Addended by: Anelisse Jacobson on: 06/26/2015 11:39 AM   Modules accepted: Orders  

## 2015-07-11 ENCOUNTER — Encounter (HOSPITAL_COMMUNITY): Payer: Self-pay | Admitting: *Deleted

## 2015-07-16 ENCOUNTER — Other Ambulatory Visit: Payer: Self-pay | Admitting: Gastroenterology

## 2015-07-16 NOTE — Addendum Note (Signed)
Addended by: Willis ModenaUTLAW, Shantanu Strauch on: 07/16/2015 04:43 PM   Modules accepted: Orders

## 2015-07-17 ENCOUNTER — Encounter (HOSPITAL_COMMUNITY)
Admission: RE | Disposition: A | Discharge status: Home / Self Care | Payer: Self-pay | Source: Ambulatory Visit | Attending: Gastroenterology

## 2015-07-17 ENCOUNTER — Encounter (HOSPITAL_COMMUNITY): Payer: Self-pay

## 2015-07-17 ENCOUNTER — Ambulatory Visit (HOSPITAL_COMMUNITY): Payer: Medicare Other | Admitting: Anesthesiology

## 2015-07-17 ENCOUNTER — Ambulatory Visit (HOSPITAL_COMMUNITY)
Admission: RE | Admit: 2015-07-17 | Discharge: 2015-07-17 | Disposition: A | Discharge status: Home / Self Care | Payer: Medicare Other | Source: Ambulatory Visit | Attending: Gastroenterology | Admitting: Gastroenterology

## 2015-07-17 DIAGNOSIS — E119 Type 2 diabetes mellitus without complications: Secondary | ICD-10-CM | POA: Diagnosis not present

## 2015-07-17 DIAGNOSIS — I10 Essential (primary) hypertension: Secondary | ICD-10-CM | POA: Insufficient documentation

## 2015-07-17 DIAGNOSIS — K869 Disease of pancreas, unspecified: Secondary | ICD-10-CM | POA: Diagnosis not present

## 2015-07-17 DIAGNOSIS — Z79899 Other long term (current) drug therapy: Secondary | ICD-10-CM | POA: Insufficient documentation

## 2015-07-17 DIAGNOSIS — K8080 Other cholelithiasis without obstruction: Secondary | ICD-10-CM | POA: Insufficient documentation

## 2015-07-17 DIAGNOSIS — Z7982 Long term (current) use of aspirin: Secondary | ICD-10-CM | POA: Diagnosis not present

## 2015-07-17 DIAGNOSIS — Z8673 Personal history of transient ischemic attack (TIA), and cerebral infarction without residual deficits: Secondary | ICD-10-CM | POA: Diagnosis not present

## 2015-07-17 DIAGNOSIS — E7800 Pure hypercholesterolemia, unspecified: Secondary | ICD-10-CM | POA: Insufficient documentation

## 2015-07-17 DIAGNOSIS — Z87891 Personal history of nicotine dependence: Secondary | ICD-10-CM | POA: Diagnosis not present

## 2015-07-17 HISTORY — PX: EUS: SHX5427

## 2015-07-17 HISTORY — PX: FINE NEEDLE ASPIRATION: SHX5430

## 2015-07-17 HISTORY — DX: Type 2 diabetes mellitus without complications: E11.9

## 2015-07-17 LAB — GLUCOSE, CAPILLARY: Glucose-Capillary: 118 mg/dL — ABNORMAL HIGH (ref 65–99)

## 2015-07-17 SURGERY — ESOPHAGEAL ENDOSCOPIC ULTRASOUND (EUS) RADIAL
Anesthesia: Monitor Anesthesia Care

## 2015-07-17 MED ORDER — PROPOFOL 10 MG/ML IV BOLUS
INTRAVENOUS | Status: DC | PRN
  Administered 2015-07-17 (×2): 50 mg via INTRAVENOUS

## 2015-07-17 MED ORDER — SODIUM CHLORIDE 0.9 % IV SOLN: INTRAVENOUS | Status: DC

## 2015-07-17 MED ORDER — LIDOCAINE HCL (CARDIAC) 20 MG/ML IV SOLN
INTRAVENOUS | Status: DC | PRN
  Administered 2015-07-17: 50 mg via INTRAVENOUS

## 2015-07-17 MED ORDER — LACTATED RINGERS IV SOLN: INTRAVENOUS | Status: DC

## 2015-07-17 MED ORDER — LACTATED RINGERS IV SOLN
INTRAVENOUS | Status: DC
  Administered 2015-07-17: 08:00:00 via INTRAVENOUS

## 2015-07-17 MED ORDER — PROPOFOL 10 MG/ML IV BOLUS
INTRAVENOUS | Status: AC
  Filled 2015-07-17: qty 20

## 2015-07-17 MED ORDER — LIDOCAINE HCL (CARDIAC) 20 MG/ML IV SOLN
INTRAVENOUS | Status: AC
  Filled 2015-07-17: qty 5

## 2015-07-17 MED ORDER — PROPOFOL 500 MG/50ML IV EMUL
INTRAVENOUS | Status: DC | PRN
  Administered 2015-07-17: 150 ug/kg/min via INTRAVENOUS

## 2015-07-17 MED ORDER — CIPROFLOXACIN IN D5W 400 MG/200ML IV SOLN
INTRAVENOUS | Status: AC
  Filled 2015-07-17: qty 200

## 2015-07-17 NOTE — Anesthesia Preprocedure Evaluation (Signed)
Anesthesia Evaluation  Patient identified by MRN, date of birth, ID band Patient awake    Reviewed: Allergy & Precautions, NPO status , Patient's Chart, lab work & pertinent test results  Airway Mallampati: II  TM Distance: >3 FB Neck ROM: Full    Dental no notable dental hx.    Pulmonary neg pulmonary ROS,    Pulmonary exam normal breath sounds clear to auscultation       Cardiovascular hypertension, Normal cardiovascular exam Rhythm:Regular Rate:Normal     Neuro/Psych CVA negative psych ROS   GI/Hepatic negative GI ROS, Neg liver ROS,   Endo/Other  diabetes  Renal/GU negative Renal ROS  negative genitourinary   Musculoskeletal negative musculoskeletal ROS (+)   Abdominal   Peds negative pediatric ROS (+)  Hematology negative hematology ROS (+)   Anesthesia Other Findings   Reproductive/Obstetrics negative OB ROS                             Anesthesia Physical Anesthesia Plan  ASA: III  Anesthesia Plan: MAC   Post-op Pain Management:    Induction: Intravenous  Airway Management Planned: Nasal Cannula  Additional Equipment:   Intra-op Plan:   Post-operative Plan:   Informed Consent: I have reviewed the patients History and Physical, chart, labs and discussed the procedure including the risks, benefits and alternatives for the proposed anesthesia with the patient or authorized representative who has indicated his/her understanding and acceptance.   Dental advisory given  Plan Discussed with: CRNA and Surgeon  Anesthesia Plan Comments:         Anesthesia Quick Evaluation

## 2015-07-17 NOTE — Transfer of Care (Signed)
Immediate Anesthesia Transfer of Care Note  Patient: [name] Soto  Procedure(s) Performed: Procedure(s): ESOPHAGEAL ENDOSCOPIC ULTRASOUND (EUS) RADIAL (N/A) FINE NEEDLE ASPIRATION (FNA) RADIAL (N/A)  Patient Location: PACU  Anesthesia Type:MAC  Level of Consciousness: awake, alert  and oriented  Airway & Oxygen Therapy: Patient Spontanous Breathing and Patient connected to nasal cannula oxygen  Post-op Assessment: Report given to RN and Post -op Vital signs reviewed and stable  Post vital signs: Reviewed and stable  Last Vitals:  Filed Vitals:   07/17/15 0732  BP: 175/81  Pulse: 70  Temp: 37.1 C  Resp: 15    Complications: No apparent anesthesia complications

## 2015-07-17 NOTE — H&P (Signed)
Patient interval history reviewed.  Patient examined again.  There has been no change from documented H/P dated 06/20/15 (scanned into chart from our office) except as documented above.  Assessment:  1.  Pancreatic tail lesion, unclear significance.  Plan:  1.  Endoscopic ultrasound with possible biopsies. 2.  Risks (bleeding, infection, bowel perforation that could require surgery, sedation-related changes in cardiopulmonary systems), benefits (identification and possible treatment of source of symptoms, exclusion of certain causes of symptoms), and alternatives (watchful waiting, radiographic imaging studies, empiric medical treatment) of upper endoscopy with ultrasound and possible biopsies (EUS +/- FNA) were explained to patient/family in detail and patient wishes to proceed.

## 2015-07-17 NOTE — Op Note (Signed)
[facility] [address] [place], [id number]   ENDOSCOPIC ULTRASOUND PROCEDURE REPORT  PATIENT: [name] Basemanllen, [name]  MR#: [id number] BIRTHDATE: 26-Dec-1945  GENDER: male ENDOSCOPIST: Willis ModenaWilliam Alver Leete, MD REFERRED BY:  Doreatha MartinGretchen Velazquez, M.D. PROCEDURE DATE:  07/17/2015 PROCEDURE:   Upper EUS ASA CLASS:      Class III INDICATIONS:   1.  pancreatic tail lesion. MEDICATIONS: Monitored anesthesia care  DESCRIPTION OF PROCEDURE:   After the risks benefits and alternatives of the procedure were  explained, informed consent was obtained. The patient was then placed in the left, lateral, decubitus postion and IV sedation was administered. Throughout the procedure, the patients blood pressure, pulse and oxygen saturations were monitored continuously.  Under direct visualization, the linear echoendoscope was introduced through the mouth  and advanced to the stomach antrum .  Water was used as necessary to provide an acoustic interface. Estimated blood loss is zero unless otherwise noted in this procedure report. Upon completion of the imaging, water was removed and the patient was sent to the recovery room in satisfactory condition.    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  There were thick septations as well as some calcifications.  Wall of cyst is thickened and septations are fairly thick as well.  The lesion is also directly adjacent to the splenic artery and vein, and there was no window for cyst aspiration that would not require direct through-puncture of the entire distal splenic artery, thus biopsies/fluid aspiration was not done.  Remainder of pancreatic genu, body and tail were normal.  No pancreatic ductal dilatation. No features of chronic pancreatitis were noted.  IMPRESSION:     As above.  Principal differential considerations include mucinous cystadenoma versus atypical  islet cell lesion.   RECOMMENDATIONS:     1.  Watch for potential complications of procedure. 2.  Management strategies include continued surveillance versus surgical resection.  Given the thickened cyst wall and septations and overall unusal appearance of the lesion, I will refer patient to pancreatic surgeon to obtain their input. 3.  Next step in management is pending surgical consultation recommendations.   _______________________________ [name] DoctoreSignedWillis Modena:  [name] Boom, MD 07/17/2015 9:35 AM   CC:

## 2015-07-17 NOTE — Anesthesia Postprocedure Evaluation (Signed)
  Anesthesia Post-op Note  Patient: [name] Soto  Procedure(s) Performed: Procedure(s) (LRB): ESOPHAGEAL ENDOSCOPIC ULTRASOUND (EUS) RADIAL (N/A) FINE NEEDLE ASPIRATION (FNA) RADIAL (N/A)  Patient Location: PACU  Anesthesia Type: MAC  Level of Consciousness: awake and alert   Airway and Oxygen Therapy: Patient Spontanous Breathing  Post-op Pain: mild  Post-op Assessment: Post-op Vital signs reviewed, Patient's Cardiovascular Status Stable, Respiratory Function Stable, Patent Airway and No signs of Nausea or vomiting  Last Vitals:  Filed Vitals:   07/17/15 0918  BP: 131/110  Pulse: 62  Temp: 36.4 C  Resp: 18    Post-op Vital Signs: stable   Complications: No apparent anesthesia complications

## 2015-07-17 NOTE — Discharge Instructions (Signed)
Endoscopic ultrasound ° °Care After °Please read the instructions outlined below and refer to this sheet in the next few weeks. These discharge instructions provide you with general information on caring for yourself after you leave the hospital. Your doctor may also give you specific instructions. While your treatment has been planned according to the most current medical practices available, unavoidable complications occasionally occur. If you have any problems or questions after discharge, please call Dr. Dorethea Strubel ([facility]) at [phone]. ° °HOME CARE INSTRUCTIONS °Activity °· You may resume your regular activity but move at a slower pace for the next 24 hours.  °· Take frequent rest periods for the next 24 hours.  °· Walking will help expel (get rid of) the air and reduce the bloated feeling in your abdomen.  °· No driving for 24 hours (because of the anesthesia (medicine) used during the test).  °· You may shower.  °· Do not sign any important legal documents or operate any machinery for 24 hours (because of the anesthesia used during the test).  °Nutrition °· Drink plenty of fluids.  °· You may resume your normal diet.  °· Begin with a light meal and progress to your normal diet.  °· Avoid alcoholic beverages for 24 hours or as instructed by your caregiver.  °Medications °You may resume your normal medications unless your caregiver tells you otherwise. °What you can expect today °· You may experience abdominal discomfort such as a feeling of fullness or "gas" pains.  °· You may experience a sore throat for 2 to 3 days. This is normal. Gargling with salt water may help this.  °·  °SEEK IMMEDIATE MEDICAL CARE IF: °· You have excessive nausea (feeling sick to your stomach) and/or vomiting.  °· You have severe abdominal pain and distention (swelling).  °· You have trouble swallowing.  °· You have a temperature over 100° F (37.8° C).  °· You have rectal bleeding or vomiting of blood.  °Document  Released: 04/14/2004 Document Revised: 05/13/2011 Document Reviewed: 10/26/2007 °[employer]® Patient Information ©2012 [employer]. °

## 2015-07-18 ENCOUNTER — Encounter (HOSPITAL_COMMUNITY): Payer: Self-pay | Admitting: Gastroenterology

## 2015-11-04 ENCOUNTER — Other Ambulatory Visit: Payer: Self-pay | Admitting: Physician Assistant

## 2015-11-06 ENCOUNTER — Other Ambulatory Visit: Payer: Self-pay | Admitting: Sports Medicine

## 2015-11-06 ENCOUNTER — Other Ambulatory Visit: Payer: Self-pay | Admitting: General Surgery

## 2015-11-06 DIAGNOSIS — K862 Cyst of pancreas: Secondary | ICD-10-CM

## 2015-11-06 DIAGNOSIS — M545 Low back pain: Secondary | ICD-10-CM

## 2015-11-20 ENCOUNTER — Inpatient Hospital Stay
Admission: RE | Admit: 2015-11-20 | Discharge: 2015-11-20 | Disposition: A | Discharge status: Home / Self Care | Payer: Self-pay | Source: Ambulatory Visit | Attending: General Surgery | Admitting: General Surgery

## 2015-11-20 ENCOUNTER — Other Ambulatory Visit: Payer: Self-pay | Admitting: General Surgery

## 2015-11-20 ENCOUNTER — Ambulatory Visit
Admission: RE | Admit: 2015-11-20 | Discharge: 2015-11-20 | Disposition: A | Discharge status: Home / Self Care | Payer: Medicare Other | Source: Ambulatory Visit | Attending: General Surgery | Admitting: General Surgery

## 2015-11-20 DIAGNOSIS — K862 Cyst of pancreas: Secondary | ICD-10-CM

## 2015-11-20 MED ORDER — GADOBENATE DIMEGLUMINE 529 MG/ML IV SOLN
19.0000 mL | Freq: Once | INTRAVENOUS | Status: AC | PRN
  Administered 2015-11-20: 19 mL via INTRAVENOUS

## 2015-11-25 NOTE — Progress Notes (Signed)
Quick Note:  Please let patient know mass is unchanged. Will need to get another MRI in 12 months. ______

## 2016-04-27 ENCOUNTER — Emergency Department (HOSPITAL_BASED_OUTPATIENT_CLINIC_OR_DEPARTMENT_OTHER)
Admission: EM | Admit: 2016-04-27 | Discharge: 2016-04-27 | Disposition: A | Discharge status: Home / Self Care | Payer: Medicare Other | Attending: Emergency Medicine | Admitting: Emergency Medicine

## 2016-04-27 ENCOUNTER — Encounter (HOSPITAL_BASED_OUTPATIENT_CLINIC_OR_DEPARTMENT_OTHER): Payer: Self-pay | Admitting: Emergency Medicine

## 2016-04-27 ENCOUNTER — Emergency Department (HOSPITAL_BASED_OUTPATIENT_CLINIC_OR_DEPARTMENT_OTHER): Payer: Medicare Other

## 2016-04-27 DIAGNOSIS — S60512A Abrasion of left hand, initial encounter: Secondary | ICD-10-CM | POA: Insufficient documentation

## 2016-04-27 DIAGNOSIS — Y929 Unspecified place or not applicable: Secondary | ICD-10-CM | POA: Diagnosis not present

## 2016-04-27 DIAGNOSIS — Y999 Unspecified external cause status: Secondary | ICD-10-CM | POA: Insufficient documentation

## 2016-04-27 DIAGNOSIS — S42201A Unspecified fracture of upper end of right humerus, initial encounter for closed fracture: Secondary | ICD-10-CM | POA: Diagnosis not present

## 2016-04-27 DIAGNOSIS — W101XXA Fall (on)(from) sidewalk curb, initial encounter: Secondary | ICD-10-CM | POA: Insufficient documentation

## 2016-04-27 DIAGNOSIS — Z8673 Personal history of transient ischemic attack (TIA), and cerebral infarction without residual deficits: Secondary | ICD-10-CM | POA: Insufficient documentation

## 2016-04-27 DIAGNOSIS — Y9301 Activity, walking, marching and hiking: Secondary | ICD-10-CM | POA: Diagnosis not present

## 2016-04-27 DIAGNOSIS — S4991XA Unspecified injury of right shoulder and upper arm, initial encounter: Secondary | ICD-10-CM | POA: Diagnosis present

## 2016-04-27 DIAGNOSIS — T148XXA Other injury of unspecified body region, initial encounter: Secondary | ICD-10-CM

## 2016-04-27 DIAGNOSIS — E119 Type 2 diabetes mellitus without complications: Secondary | ICD-10-CM | POA: Insufficient documentation

## 2016-04-27 MED ORDER — OXYCODONE-ACETAMINOPHEN 5-325 MG PO TABS: 1.0000 | ORAL_TABLET | ORAL | 0 refills | Status: DC | PRN

## 2016-04-27 MED ORDER — OXYCODONE-ACETAMINOPHEN 5-325 MG PO TABS
1.0000 | ORAL_TABLET | Freq: Once | ORAL | Status: AC
  Administered 2016-04-27: 1 via ORAL
  Filled 2016-04-27: qty 1

## 2016-04-27 MED ORDER — MORPHINE SULFATE (PF) 4 MG/ML IV SOLN
4.0000 mg | Freq: Once | INTRAVENOUS | Status: AC
  Administered 2016-04-27: 4 mg via INTRAMUSCULAR
  Filled 2016-04-27: qty 1

## 2016-04-27 NOTE — ED Triage Notes (Signed)
Pt tripped on sidewalk leaving the bank and fell.  Pt had cataract surgery on his right eye last week.

## 2016-04-27 NOTE — ED Provider Notes (Signed)
[facility] Provider Note   CSN: [id number] Arrival date & time: 04/27/16  1732   By signing my name below, I, [name], attest that this documentation has been prepared under the direction and in the presence of [name], MD. Electronically Signed: [name], ED Scribe. 04/27/16. 6:27 PM.   History   Chief Complaint Chief Complaint  Patient presents with  . Fall    The history is provided by the patient. No language interpreter was used.   HPI Comments:  [name] is a 70 y.o. male who presents to the Emergency Department complaining of constant, moderate right shoulder pain status post fall, which occurred a couple hours ago. Pt reports that he was walking toward the bank when he tripped and fell on the sidewalk. Associated symptoms include abrasions on left hand and chest. Denies loss of consciousness, pain in right elbow, right wrist, neck, spine or legs and decreased sensations or numbness. Pt takes an aspirin a day.   Past Medical History:  Diagnosis Date  . Cerebral thrombosis with cerebral infarction (HCC)   . Diabetes mellitus without complication (HCC)   . Stroke [facility](HCC)     There are no active problems to display for this patient.   Past Surgical History:  Procedure Laterality Date  . EUS N/A 07/17/2015   Procedure: ESOPHAGEAL ENDOSCOPIC ULTRASOUND (EUS) RADIAL;  Surgeon: Willis ModenaWilliam Outlaw, MD;  Location: [facility] ENDOSCOPY;  Service: Endoscopy;  Laterality: N/A;  . FINE NEEDLE ASPIRATION N/A 07/17/2015   Procedure: FINE NEEDLE ASPIRATION (FNA) RADIAL;  Surgeon: Willis ModenaWilliam Outlaw, MD;  Location: [facility] ENDOSCOPY;  Service: Endoscopy;  Laterality: N/A;  . HERNIA REPAIR     inguinal right  . HYDROCELE EXCISION         Home Medications    Prior to Admission medications   Medication Sig Start Date End Date Taking? Authorizing Provider  amLODipine (NORVASC) 5 MG tablet Take 5 mg by mouth every morning.    Historical Provider, MD  aspirin 81 MG tablet  Take 81 mg by mouth daily.    Historical Provider, MD  atorvastatin (LIPITOR) 40 MG tablet Take 40 mg by mouth daily.    Historical Provider, MD  lisinopril (PRINIVIL,ZESTRIL) 40 MG tablet Take 40 mg by mouth 2 (two) times daily.    Historical Provider, MD  oxyCODONE-acetaminophen (PERCOCET) 5-325 MG tablet Take 1 tablet by mouth every 4 (four) hours as needed for severe pain. 04/27/16   Loren Raceravid Loney Peto, MD    Family History No family history on file.  Social History Social History  Substance Use Topics  . Smoking status: Never Smoker  . Smokeless tobacco: Never Used  . Alcohol use No     Allergies   Review of patient's allergies indicates no known allergies.   Review of Systems Review of Systems  Constitutional: Negative for chills and fever.  HENT: Negative for facial swelling.   Eyes: Negative for visual disturbance.  Respiratory: Negative for chest tightness and shortness of breath.   Cardiovascular: Negative for chest pain, palpitations and leg swelling.  Gastrointestinal: Negative for abdominal pain, constipation, diarrhea and vomiting.  Musculoskeletal: Positive for arthralgias and joint swelling. Negative for back pain, neck pain and neck stiffness.  Skin: Positive for wound.  Neurological: Negative for dizziness, syncope, weakness, light-headedness, numbness and headaches.  All other systems reviewed and are negative.    Physical Exam Updated Vital Signs BP 149/80   Pulse 63   Temp 97.7 F (36.5 C) (Oral)   Resp 18  Ht 5\' 10"  (1.778 m)   Wt 208 lb (94.3 kg)   SpO2 100%   BMI 29.84 kg/m   Physical Exam  Constitutional: He is oriented to person, place, and time. He appears well-developed and well-nourished. No distress.  HENT:  Head: Normocephalic and atraumatic.  Mouth/Throat: Oropharynx is clear and moist.  Midface is stable. No malocclusion.  Eyes: EOM are normal. Pupils are equal, round, and reactive to light.  Neck: Normal range of motion. Neck  supple.  No posterior midline cervical tenderness to palpation.  Cardiovascular: Normal rate and regular rhythm.   Pulmonary/Chest: Effort normal and breath sounds normal. No respiratory distress. He has no wheezes. He has no rales.  Abdominal: Soft. Bowel sounds are normal. He exhibits no distension and no mass. There is no tenderness. There is no rebound and no guarding. No hernia.  Musculoskeletal: Normal range of motion. He exhibits no edema or tenderness.  No midline thoracic or lumbar tenderness. Pelvis is stable. Full range of motion of bilateral lower extremities. Patient with swelling and decreased movement in the right shoulder. Diffusely tender to palpation. Patient with no swelling or tenderness to the right elbow or wrist.   Neurological: He is alert and oriented to person, place, and time.  Normal bilateral grip strength. Sensation intact.  Skin: Skin is warm and dry. Capillary refill takes less than 2 seconds. No rash noted. No erythema.  Patient has small superficial abrasion over the tip of his nose as well as the dorsum of the left hand and left chest.  Psychiatric: He has a normal mood and affect. His behavior is normal.  Nursing note and vitals reviewed.    ED Treatments / Results  DIAGNOSTIC STUDIES:  Oxygen Saturation is 95% on room air, normal by my interpretation.    COORDINATION OF CARE:  6:07 PM Discussed treatment plan with pt at bedside, which includes x-ray of right humerus, and pt agreed to plan.  Labs (all labs ordered are listed, but only abnormal results are displayed) Labs Reviewed - No data to display  EKG  EKG Interpretation None       Radiology Dg Shoulder Right  Result Date: 04/27/2016 CLINICAL DATA:  Fall on sidewalk after leaving bank today. Landed on right shoulder. Right shoulder pain. EXAM: RIGHT SHOULDER - 2+ VIEW COMPARISON:  None. FINDINGS: A comminuted right humeral neck and humeral head fracture is present. There may be a fracture  of the distal clavicle is well. The scapula appears to be intact. Right hemi thorax is intact. IMPRESSION: 1. Comminuted proximal right humerus fracture involving the humeral head. 2. Question nondisplaced distal clavicle fracture. Electronically Signed   By: Marin Roberts M.D.   On: 04/27/2016 19:04    Procedures Procedures (including critical care time)  Medications Ordered in ED Medications  morphine 4 MG/ML injection 4 mg (4 mg Intramuscular Given 04/27/16 1814)  oxyCODONE-acetaminophen (PERCOCET/ROXICET) 5-325 MG per tablet 1 tablet (1 tablet Oral Given 04/27/16 1959)     Initial Impression / Assessment and Plan / ED Course  I have reviewed the triage vital signs and the nursing notes.  Pertinent labs & imaging results that were available during my care of the patient were reviewed by me and considered in my medical decision making (see chart for details).  Clinical Course  Posterior long-arm splint placed. Patient states his pain is improved significantly. Continues to have a normal neurologic exam. Very minor head injury. Patient's on no blood thinners. Do not believe that imaging is  necessary at this point. Patient's been given return precautions as voice understanding. Understands the need to follow-up with orthopedist    Final Clinical Impressions(s) / ED Diagnoses   Final diagnoses:  Proximal humeral fracture, right, closed, initial encounter  Abrasion    New Prescriptions New Prescriptions   OXYCODONE-ACETAMINOPHEN (PERCOCET) 5-325 MG TABLET    Take 1 tablet by mouth every 4 (four) hours as needed for severe pain.  I personally performed the services described in this documentation, which was scribed in my presence. The recorded information has been reviewed and is accurate.       Loren Raceravid Jania Steinke, MD 04/27/16 [phone]

## 2016-04-29 ENCOUNTER — Ambulatory Visit
Admission: RE | Admit: 2016-04-29 | Discharge: 2016-04-29 | Disposition: A | Discharge status: Home / Self Care | Payer: Medicare Other | Source: Ambulatory Visit | Attending: Orthopedic Surgery | Admitting: Orthopedic Surgery

## 2016-04-29 ENCOUNTER — Other Ambulatory Visit: Payer: Self-pay | Admitting: Orthopedic Surgery

## 2016-04-29 DIAGNOSIS — M25511 Pain in right shoulder: Secondary | ICD-10-CM

## 2016-05-06 ENCOUNTER — Encounter (HOSPITAL_COMMUNITY): Payer: Self-pay | Admitting: *Deleted

## 2016-05-06 NOTE — Progress Notes (Signed)
Pt denies SOB, chest pain, and being under the care of a cardiologist. Pt denies having a stress test, echo and cardiac cath. Pt stated that an EKG was done " in the early part of August 2016 [facility]; records requested. Pt made aware to stop  taking Aspirin, vitamins, fish oil  and herbal medications. Do not take any NSAIDs ie: Ibuprofen, Advil, Naproxen, BC and Goody Powder. Pt stated that he checks his blood glucose " maybe once a week " and it is usually in the range of 90's -100's. Pt made aware of diabetes protocol to check BS every 2 hours prior to arrival, protocol for BS <70  With interventions and phone number to SS. Pt verbalized understanding of all pre-op instructions.

## 2016-05-07 ENCOUNTER — Inpatient Hospital Stay (HOSPITAL_COMMUNITY): Payer: Medicare Other | Admitting: Anesthesiology

## 2016-05-07 ENCOUNTER — Encounter (HOSPITAL_COMMUNITY): Payer: Self-pay | Admitting: *Deleted

## 2016-05-07 ENCOUNTER — Encounter (HOSPITAL_COMMUNITY)
Admission: RE | Disposition: A | Discharge status: Home / Self Care | Payer: Self-pay | Source: Ambulatory Visit | Attending: Orthopedic Surgery

## 2016-05-07 ENCOUNTER — Inpatient Hospital Stay (HOSPITAL_COMMUNITY)
Admission: RE | Admit: 2016-05-07 | Discharge: 2016-05-08 | DRG: 483 | Disposition: A | Discharge status: Home / Self Care | Payer: Medicare Other | Source: Ambulatory Visit | Attending: Orthopedic Surgery | Admitting: Orthopedic Surgery

## 2016-05-07 DIAGNOSIS — Z87891 Personal history of nicotine dependence: Secondary | ICD-10-CM

## 2016-05-07 DIAGNOSIS — Z8673 Personal history of transient ischemic attack (TIA), and cerebral infarction without residual deficits: Secondary | ICD-10-CM | POA: Diagnosis not present

## 2016-05-07 DIAGNOSIS — S42251A Displaced fracture of greater tuberosity of right humerus, initial encounter for closed fracture: Principal | ICD-10-CM | POA: Diagnosis present

## 2016-05-07 DIAGNOSIS — W101XXA Fall (on)(from) sidewalk curb, initial encounter: Secondary | ICD-10-CM | POA: Diagnosis present

## 2016-05-07 DIAGNOSIS — B379 Candidiasis, unspecified: Secondary | ICD-10-CM | POA: Diagnosis present

## 2016-05-07 DIAGNOSIS — Z79899 Other long term (current) drug therapy: Secondary | ICD-10-CM | POA: Diagnosis not present

## 2016-05-07 DIAGNOSIS — I10 Essential (primary) hypertension: Secondary | ICD-10-CM | POA: Diagnosis present

## 2016-05-07 DIAGNOSIS — Y92480 Sidewalk as the place of occurrence of the external cause: Secondary | ICD-10-CM | POA: Diagnosis not present

## 2016-05-07 DIAGNOSIS — Z9841 Cataract extraction status, right eye: Secondary | ICD-10-CM

## 2016-05-07 DIAGNOSIS — E119 Type 2 diabetes mellitus without complications: Secondary | ICD-10-CM | POA: Diagnosis present

## 2016-05-07 DIAGNOSIS — Z96619 Presence of unspecified artificial shoulder joint: Secondary | ICD-10-CM

## 2016-05-07 DIAGNOSIS — Z96611 Presence of right artificial shoulder joint: Secondary | ICD-10-CM

## 2016-05-07 HISTORY — DX: Unspecified fracture of shaft of humerus, unspecified arm, initial encounter for closed fracture: S42.309A

## 2016-05-07 HISTORY — DX: Disease of pancreas, unspecified: K86.9

## 2016-05-07 HISTORY — DX: Gastro-esophageal reflux disease without esophagitis: K21.9

## 2016-05-07 HISTORY — PX: REVERSE SHOULDER ARTHROPLASTY: SHX5054

## 2016-05-07 HISTORY — DX: Essential (primary) hypertension: I10

## 2016-05-07 LAB — CBC
HCT: 39 % (ref 39.0–52.0)
Hemoglobin: 12.5 g/dL — ABNORMAL LOW (ref 13.0–17.0)
MCH: 28.5 pg (ref 26.0–34.0)
MCHC: 32.1 g/dL (ref 30.0–36.0)
MCV: 88.8 fL (ref 78.0–100.0)
Platelets: 268 10*3/uL (ref 150–400)
RBC: 4.39 MIL/uL (ref 4.22–5.81)
RDW: 14.7 % (ref 11.5–15.5)
WBC: 9.5 10*3/uL (ref 4.0–10.5)

## 2016-05-07 LAB — COMPREHENSIVE METABOLIC PANEL
ALT: 23 U/L (ref 17–63)
AST: 20 U/L (ref 15–41)
Albumin: 3.4 g/dL — ABNORMAL LOW (ref 3.5–5.0)
Alkaline Phosphatase: 60 U/L (ref 38–126)
Anion gap: 7 (ref 5–15)
BUN: 19 mg/dL (ref 6–20)
CO2: 26 mmol/L (ref 22–32)
Calcium: 9.2 mg/dL (ref 8.9–10.3)
Chloride: 107 mmol/L (ref 101–111)
Creatinine, Ser: 1.06 mg/dL (ref 0.61–1.24)
GFR calc Af Amer: >60 mL/min (ref >60)
GFR calc non Af Amer: >60 mL/min (ref >60)
Glucose, Bld: 133 mg/dL — ABNORMAL HIGH (ref 65–99)
Potassium: 3.9 mmol/L (ref 3.5–5.1)
Sodium: 140 mmol/L (ref 135–145)
Total Bilirubin: 1.2 mg/dL (ref 0.3–1.2)
Total Protein: 6.7 g/dL (ref 6.5–8.1)

## 2016-05-07 LAB — GLUCOSE, CAPILLARY
Glucose-Capillary: 136 mg/dL — ABNORMAL HIGH (ref 65–99)
Glucose-Capillary: 117 mg/dL — ABNORMAL HIGH (ref 65–99)
Glucose-Capillary: 158 mg/dL — ABNORMAL HIGH (ref 65–99)

## 2016-05-07 SURGERY — ARTHROPLASTY, SHOULDER, TOTAL, REVERSE
Anesthesia: Regional | Site: Shoulder | Laterality: Right

## 2016-05-07 MED ORDER — SUGAMMADEX SODIUM 200 MG/2ML IV SOLN
INTRAVENOUS | Status: DC | PRN
  Administered 2016-05-07: 188.6 mg via INTRAVENOUS

## 2016-05-07 MED ORDER — PROPOFOL 10 MG/ML IV BOLUS
INTRAVENOUS | Status: AC
  Filled 2016-05-07: qty 20

## 2016-05-07 MED ORDER — SUCCINYLCHOLINE CHLORIDE 200 MG/10ML IV SOSY
PREFILLED_SYRINGE | INTRAVENOUS | Status: AC
  Filled 2016-05-07: qty 10

## 2016-05-07 MED ORDER — CEFAZOLIN SODIUM-DEXTROSE 2-4 GM/100ML-% IV SOLN
INTRAVENOUS | Status: AC
  Filled 2016-05-07: qty 100

## 2016-05-07 MED ORDER — MEPERIDINE HCL 25 MG/ML IJ SOLN: 6.2500 mg | INTRAMUSCULAR | Status: DC | PRN

## 2016-05-07 MED ORDER — FENTANYL CITRATE (PF) 100 MCG/2ML IJ SOLN
INTRAMUSCULAR | Status: DC | PRN
  Administered 2016-05-07 (×2): 50 ug via INTRAVENOUS

## 2016-05-07 MED ORDER — LISINOPRIL 40 MG PO TABS
40.0000 mg | ORAL_TABLET | Freq: Two times a day (BID) | ORAL | Status: DC
  Administered 2016-05-07 – 2016-05-08 (×2): 40 mg via ORAL
  Filled 2016-05-07 (×2): qty 1

## 2016-05-07 MED ORDER — CEFAZOLIN SODIUM-DEXTROSE 2-4 GM/100ML-% IV SOLN
2.0000 g | INTRAVENOUS | Status: AC
  Administered 2016-05-07: 2 g via INTRAVENOUS

## 2016-05-07 MED ORDER — DIAZEPAM 5 MG PO TABS
2.5000 mg | ORAL_TABLET | Freq: Four times a day (QID) | ORAL | Status: DC | PRN
  Administered 2016-05-08 (×2): 5 mg via ORAL
  Filled 2016-05-07 (×2): qty 1

## 2016-05-07 MED ORDER — METOCLOPRAMIDE HCL 5 MG PO TABS: 5.0000 mg | ORAL_TABLET | Freq: Three times a day (TID) | ORAL | Status: DC | PRN

## 2016-05-07 MED ORDER — PHENOL 1.4 % MT LIQD: 1.0000 | OROMUCOSAL | Status: DC | PRN

## 2016-05-07 MED ORDER — ASPIRIN EC 81 MG PO TBEC
81.0000 mg | DELAYED_RELEASE_TABLET | Freq: Every day | ORAL | Status: DC
  Administered 2016-05-07 – 2016-05-08 (×2): 81 mg via ORAL
  Filled 2016-05-07 (×2): qty 1

## 2016-05-07 MED ORDER — MAGNESIUM CITRATE PO SOLN: 1.0000 | Freq: Once | ORAL | Status: DC | PRN

## 2016-05-07 MED ORDER — METOCLOPRAMIDE HCL 5 MG/ML IJ SOLN: 5.0000 mg | Freq: Three times a day (TID) | INTRAMUSCULAR | Status: DC | PRN

## 2016-05-07 MED ORDER — ONDANSETRON HCL 4 MG/2ML IJ SOLN
INTRAMUSCULAR | Status: AC
  Filled 2016-05-07: qty 2

## 2016-05-07 MED ORDER — HYDROMORPHONE HCL 1 MG/ML IJ SOLN: 0.2500 mg | INTRAMUSCULAR | Status: DC | PRN

## 2016-05-07 MED ORDER — 0.9 % SODIUM CHLORIDE (POUR BTL) OPTIME
TOPICAL | Status: DC | PRN
  Administered 2016-05-07: 1000 mL

## 2016-05-07 MED ORDER — ARTIFICIAL TEARS OP OINT
TOPICAL_OINTMENT | OPHTHALMIC | Status: AC
  Filled 2016-05-07: qty 7

## 2016-05-07 MED ORDER — FENTANYL CITRATE (PF) 100 MCG/2ML IJ SOLN
INTRAMUSCULAR | Status: AC
  Filled 2016-05-07: qty 2

## 2016-05-07 MED ORDER — BUPIVACAINE-EPINEPHRINE (PF) 0.5% -1:200000 IJ SOLN
INTRAMUSCULAR | Status: DC | PRN
  Administered 2016-05-07: 30 mL via PERINEURAL

## 2016-05-07 MED ORDER — DIPHENHYDRAMINE HCL 12.5 MG/5ML PO ELIX: 12.5000 mg | ORAL_SOLUTION | ORAL | Status: DC | PRN

## 2016-05-07 MED ORDER — FENTANYL CITRATE (PF) 100 MCG/2ML IJ SOLN: 25.0000 ug | INTRAMUSCULAR | Status: DC | PRN

## 2016-05-07 MED ORDER — ONDANSETRON HCL 4 MG/2ML IJ SOLN
INTRAMUSCULAR | Status: DC | PRN
  Administered 2016-05-07: 4 mg via INTRAVENOUS

## 2016-05-07 MED ORDER — BISACODYL 5 MG PO TBEC: 5.0000 mg | DELAYED_RELEASE_TABLET | Freq: Every day | ORAL | Status: DC | PRN

## 2016-05-07 MED ORDER — ROCURONIUM BROMIDE 100 MG/10ML IV SOLN
INTRAVENOUS | Status: DC | PRN
  Administered 2016-05-07: 50 mg via INTRAVENOUS

## 2016-05-07 MED ORDER — ALBUMIN HUMAN 5 % IV SOLN
INTRAVENOUS | Status: DC | PRN
  Administered 2016-05-07: 18:00:00 via INTRAVENOUS

## 2016-05-07 MED ORDER — FENTANYL CITRATE (PF) 100 MCG/2ML IJ SOLN
INTRAMUSCULAR | Status: AC
  Administered 2016-05-07: 100 ug via INTRAVENOUS
  Filled 2016-05-07: qty 2

## 2016-05-07 MED ORDER — ONDANSETRON HCL 4 MG/2ML IJ SOLN: 4.0000 mg | Freq: Once | INTRAMUSCULAR | Status: DC | PRN

## 2016-05-07 MED ORDER — ACETAMINOPHEN 325 MG PO TABS
650.0000 mg | ORAL_TABLET | Freq: Four times a day (QID) | ORAL | Status: DC | PRN
  Administered 2016-05-08: 650 mg via ORAL
  Filled 2016-05-07: qty 2

## 2016-05-07 MED ORDER — DEXTROSE 5 % IV SOLN
INTRAVENOUS | Status: DC | PRN
  Administered 2016-05-07: 25 ug/min via INTRAVENOUS

## 2016-05-07 MED ORDER — ARTIFICIAL TEARS OP OINT
TOPICAL_OINTMENT | OPHTHALMIC | Status: DC | PRN
  Administered 2016-05-07: 1 via OPHTHALMIC

## 2016-05-07 MED ORDER — ONDANSETRON HCL 4 MG PO TABS: 4.0000 mg | ORAL_TABLET | Freq: Four times a day (QID) | ORAL | Status: DC | PRN

## 2016-05-07 MED ORDER — PHENYLEPHRINE HCL 10 MG/ML IJ SOLN
INTRAMUSCULAR | Status: DC | PRN
  Administered 2016-05-07: 80 ug via INTRAVENOUS

## 2016-05-07 MED ORDER — ACETAMINOPHEN 650 MG RE SUPP: 650.0000 mg | Freq: Four times a day (QID) | RECTAL | Status: DC | PRN

## 2016-05-07 MED ORDER — LIDOCAINE HCL (CARDIAC) 20 MG/ML IV SOLN
INTRAVENOUS | Status: DC | PRN
  Administered 2016-05-07: 80 mg via INTRAVENOUS

## 2016-05-07 MED ORDER — MENTHOL 3 MG MT LOZG: 1.0000 | LOZENGE | OROMUCOSAL | Status: DC | PRN

## 2016-05-07 MED ORDER — MIDAZOLAM HCL 2 MG/2ML IJ SOLN
INTRAMUSCULAR | Status: AC
  Administered 2016-05-07: 2 mg via INTRAVENOUS
  Filled 2016-05-07: qty 2

## 2016-05-07 MED ORDER — LACTATED RINGERS IV SOLN
INTRAVENOUS | Status: DC
  Administered 2016-05-07: 21:00:00 via INTRAVENOUS

## 2016-05-07 MED ORDER — CHLORHEXIDINE GLUCONATE 4 % EX LIQD: 60.0000 mL | Freq: Once | CUTANEOUS | Status: DC

## 2016-05-07 MED ORDER — HYDROMORPHONE HCL 1 MG/ML IJ SOLN
1.0000 mg | INTRAMUSCULAR | Status: DC | PRN
  Administered 2016-05-08: 1 mg via INTRAVENOUS
  Filled 2016-05-07: qty 1

## 2016-05-07 MED ORDER — DOCUSATE SODIUM 100 MG PO CAPS
100.0000 mg | ORAL_CAPSULE | Freq: Two times a day (BID) | ORAL | Status: DC
  Administered 2016-05-07 – 2016-05-08 (×2): 100 mg via ORAL
  Filled 2016-05-07 (×2): qty 1

## 2016-05-07 MED ORDER — LIDOCAINE HCL 4 % EX SOLN
CUTANEOUS | Status: DC | PRN
  Administered 2016-05-07: 4 mL via TOPICAL

## 2016-05-07 MED ORDER — CEFAZOLIN SODIUM-DEXTROSE 2-4 GM/100ML-% IV SOLN
2.0000 g | Freq: Four times a day (QID) | INTRAVENOUS | Status: AC
  Administered 2016-05-07 – 2016-05-08 (×3): 2 g via INTRAVENOUS
  Filled 2016-05-07 (×3): qty 100

## 2016-05-07 MED ORDER — PROPOFOL 10 MG/ML IV BOLUS
INTRAVENOUS | Status: DC | PRN
  Administered 2016-05-07: 50 mg via INTRAVENOUS
  Administered 2016-05-07: 150 mg via INTRAVENOUS

## 2016-05-07 MED ORDER — MIDAZOLAM HCL 2 MG/2ML IJ SOLN
2.0000 mg | Freq: Once | INTRAMUSCULAR | Status: AC
  Administered 2016-05-07: 2 mg via INTRAVENOUS

## 2016-05-07 MED ORDER — OXYCODONE HCL 5 MG PO TABS
5.0000 mg | ORAL_TABLET | ORAL | Status: DC | PRN
  Administered 2016-05-08 (×2): 10 mg via ORAL
  Filled 2016-05-07 (×2): qty 2

## 2016-05-07 MED ORDER — ONDANSETRON HCL 4 MG/2ML IJ SOLN
4.0000 mg | Freq: Four times a day (QID) | INTRAMUSCULAR | Status: DC | PRN
  Administered 2016-05-08: 4 mg via INTRAVENOUS
  Filled 2016-05-07: qty 2

## 2016-05-07 MED ORDER — PROMETHAZINE HCL 25 MG/ML IJ SOLN: 6.2500 mg | INTRAMUSCULAR | Status: DC | PRN

## 2016-05-07 MED ORDER — AMLODIPINE BESYLATE 5 MG PO TABS
5.0000 mg | ORAL_TABLET | Freq: Every morning | ORAL | Status: DC
  Administered 2016-05-08: 5 mg via ORAL
  Filled 2016-05-07: qty 1

## 2016-05-07 MED ORDER — LACTATED RINGERS IV SOLN
INTRAVENOUS | Status: DC
  Administered 2016-05-07: 16:00:00 via INTRAVENOUS
  Administered 2016-05-07: 50 mL/h via INTRAVENOUS

## 2016-05-07 MED ORDER — TRANEXAMIC ACID 1000 MG/10ML IV SOLN
2000.0000 mg | Freq: Once | INTRAVENOUS | Status: DC
  Filled 2016-05-07: qty 20

## 2016-05-07 MED ORDER — FENTANYL CITRATE (PF) 100 MCG/2ML IJ SOLN
100.0000 ug | Freq: Once | INTRAMUSCULAR | Status: AC
  Administered 2016-05-07: 100 ug via INTRAVENOUS

## 2016-05-07 MED ORDER — OXYCODONE HCL 5 MG PO TABS: 5.0000 mg | ORAL_TABLET | Freq: Once | ORAL | Status: DC | PRN

## 2016-05-07 MED ORDER — POLYETHYLENE GLYCOL 3350 17 G PO PACK: 17.0000 g | PACK | Freq: Every day | ORAL | Status: DC | PRN

## 2016-05-07 MED ORDER — ROCURONIUM BROMIDE 10 MG/ML (PF) SYRINGE
PREFILLED_SYRINGE | INTRAVENOUS | Status: AC
  Filled 2016-05-07: qty 20

## 2016-05-07 MED ORDER — OXYCODONE HCL 5 MG/5ML PO SOLN: 5.0000 mg | Freq: Once | ORAL | Status: DC | PRN

## 2016-05-07 SURGICAL SUPPLY — 67 items
BASEPLATE GLENOID SHLDR SM (Shoulder) ×2 IMPLANT
BLADE SAW SGTL 83.5X18.5 (BLADE) ×2 IMPLANT
CEMENT HV SMART SET (Cement) ×4 IMPLANT
CEMENT RESTRICTOR DEPUY SZ 3 (Cement) ×2 IMPLANT
COVER SURGICAL LIGHT HANDLE (MISCELLANEOUS) ×2 IMPLANT
CUP SUT UNIV REVERS 39 NEU (Shoulder) ×2 IMPLANT
DERMABOND ADVANCED (GAUZE/BANDAGES/DRESSINGS) ×1
DERMABOND ADVANCED .7 DNX12 (GAUZE/BANDAGES/DRESSINGS) ×1 IMPLANT
DRAPE ORTHO SPLIT 77X108 STRL (DRAPES) ×2
DRAPE SURG 17X11 SM STRL (DRAPES) ×2 IMPLANT
DRAPE SURG ORHT 6 SPLT 77X108 (DRAPES) ×2 IMPLANT
DRAPE U-SHAPE 47X51 STRL (DRAPES) ×2 IMPLANT
DRSG AQUACEL AG ADV 3.5X10 (GAUZE/BANDAGES/DRESSINGS) ×2 IMPLANT
DURAPREP 26ML APPLICATOR (WOUND CARE) ×2 IMPLANT
ELECT BLADE 4.0 EZ CLEAN MEGAD (MISCELLANEOUS) ×2
ELECT CAUTERY BLADE 6.4 (BLADE) ×2 IMPLANT
ELECT REM PT RETURN 9FT ADLT (ELECTROSURGICAL) ×2
ELECTRODE BLDE 4.0 EZ CLN MEGD (MISCELLANEOUS) ×1 IMPLANT
ELECTRODE REM PT RTRN 9FT ADLT (ELECTROSURGICAL) ×1 IMPLANT
FACESHIELD WRAPAROUND (MASK) ×8 IMPLANT
GLENOSPHERE LAT 39+4 SHOULDER (Shoulder) ×2 IMPLANT
GLOVE BIO SURGEON STRL SZ7.5 (GLOVE) ×2 IMPLANT
GLOVE BIO SURGEON STRL SZ8 (GLOVE) ×2 IMPLANT
GLOVE EUDERMIC 7 POWDERFREE (GLOVE) ×2 IMPLANT
GLOVE SS BIOGEL STRL SZ 7.5 (GLOVE) ×1 IMPLANT
GLOVE SUPERSENSE BIOGEL SZ 7.5 (GLOVE) ×1
GOWN STRL REUS W/ TWL LRG LVL3 (GOWN DISPOSABLE) IMPLANT
GOWN STRL REUS W/ TWL XL LVL3 (GOWN DISPOSABLE) ×2 IMPLANT
GOWN STRL REUS W/TWL LRG LVL3 (GOWN DISPOSABLE)
GOWN STRL REUS W/TWL XL LVL3 (GOWN DISPOSABLE) ×2
INSERT HUMERAL 39/+6 (Insert) ×2 IMPLANT
KIT BASIN OR (CUSTOM PROCEDURE TRAY) ×2 IMPLANT
KIT ROOM TURNOVER OR (KITS) ×2 IMPLANT
MANIFOLD NEPTUNE II (INSTRUMENTS) ×2 IMPLANT
NDL SUT .5 MAYO 1.404X.05X (NEEDLE) ×1 IMPLANT
NEEDLE 1/2 CIR CATGUT .05X1.09 (NEEDLE) ×2 IMPLANT
NEEDLE HYPO 25GX1X1/2 BEV (NEEDLE) IMPLANT
NEEDLE MAYO TAPER (NEEDLE) ×1
NS IRRIG 1000ML POUR BTL (IV SOLUTION) ×2 IMPLANT
PACK SHOULDER (CUSTOM PROCEDURE TRAY) ×2 IMPLANT
PAD ARMBOARD 7.5X6 YLW CONV (MISCELLANEOUS) ×4 IMPLANT
PASSER SUT SWANSON 36MM LOOP (INSTRUMENTS) IMPLANT
RESTRAINT HEAD UNIVERSAL NS (MISCELLANEOUS) ×4 IMPLANT
SCREW CENTRAL NONLOCK 6.5X20MM (Shoulder) ×2 IMPLANT
SCREW LOCK GLENOID UNI PERI (Screw) ×2 IMPLANT
SCREW LOCK PERIPHERAL 30MM (Shoulder) ×2 IMPLANT
SET PIN UNIVERSAL REVERSE (SET/KITS/TRAYS/PACK) ×2 IMPLANT
SLING ARM FOAM STRAP LRG (SOFTGOODS) IMPLANT
SLING ARM IMMOBILIZER LRG (SOFTGOODS) ×2 IMPLANT
SPONGE LAP 18X18 X RAY DECT (DISPOSABLE) ×6 IMPLANT
SPONGE LAP 4X18 X RAY DECT (DISPOSABLE) ×4 IMPLANT
STEM REVERS UNIVERS SZ7 CAP (Shoulder) ×2 IMPLANT
SUCTION FRAZIER HANDLE 10FR (MISCELLANEOUS) ×1
SUCTION TUBE FRAZIER 10FR DISP (MISCELLANEOUS) ×1 IMPLANT
SUT BONE WAX W31G (SUTURE) IMPLANT
SUT FIBERWIRE #2 38 T-5 BLUE (SUTURE) ×6
SUT MNCRL AB 3-0 PS2 18 (SUTURE) ×4 IMPLANT
SUT MON AB 2-0 CT1 36 (SUTURE) ×2 IMPLANT
SUT VIC AB 1 CT1 27 (SUTURE) ×1
SUT VIC AB 1 CT1 27XBRD ANBCTR (SUTURE) ×1 IMPLANT
SUT VIC AB 2-0 CT1 27 (SUTURE)
SUT VIC AB 2-0 CT1 TAPERPNT 27 (SUTURE) IMPLANT
SUTURE FIBERWR #2 38 T-5 BLUE (SUTURE) ×3 IMPLANT
SYR CONTROL 10ML LL (SYRINGE) IMPLANT
TOWEL OR 17X24 6PK STRL BLUE (TOWEL DISPOSABLE) ×2 IMPLANT
TOWEL OR 17X26 10 PK STRL BLUE (TOWEL DISPOSABLE) ×2 IMPLANT
WATER STERILE IRR 1000ML POUR (IV SOLUTION) ×2 IMPLANT

## 2016-05-07 NOTE — Op Note (Signed)
05/07/2016  6:36 PM  PATIENT:   [name] Soto  70 y.o. male  PRE-OPERATIVE DIAGNOSIS:  FOUR PART HUMERUS FRACTURE  POST-OPERATIVE DIAGNOSIS:  same  PROCEDURE:  R RSA, #7 cemented stem, +6 poly, 39/+4 glenosphere  SURGEON:  Maye Parkinson, Vania ReaKevin M. M.D.  ASSISTANTS: Shuford pac   ANESTHESIA:   GET + ISB  EBL: 400  SPECIMEN:  none  Drains: none   PATIENT DISPOSITION:  PACU - hemodynamically stable.    PLAN OF CARE: Admit for overnight observation  Dictation# [id number]   Contact # [phone]

## 2016-05-07 NOTE — Anesthesia Procedure Notes (Deleted)
Performed by: Luca Dyar B       

## 2016-05-07 NOTE — Discharge Instructions (Signed)
° °  [name] ReaKevin M. Supple, M.D., F.A.A.O.S. Orthopaedic Surgery Specializing in Arthroscopic and Reconstructive Surgery of the Shoulder and Knee [phone] [address] [place], [place] [address] - Fax [phone]   POST-OP TOTAL SHOULDER REPLACEMENT INSTRUCTIONS  1. Call the office at [phone] to schedule your first post-op appointment 10-14 days from the date of your surgery.  2. The bandage over your incision is waterproof. You may begin showering with this dressing on. You may leave this dressing on until first follow up appointment within 2 weeks. We prefer you leave this dressing in place until follow up however after 5-7 days if you are having itching or skin irritation and would like to remove it you may do so. Go slow and tug at the borders gently to break the bond the dressing has with the skin. At this point if there is no drainage it is okay to go without a bandage or you may cover it with a light guaze and tape. You can also expect significant bruising around your shoulder that will drift down your arm and into your chest wall. This is very normal and should resolve over several days.   3. Wear your sling/immobilizer at all times except to perform the exercises below or to occasionally let your arm dangle by your side to stretch your elbow. You also need to sleep in your sling immobilizer until instructed otherwise.  4. Range of motion to your elbow, wrist, and hand are encouraged 3-5 times daily. Exercise to your hand and fingers helps to reduce swelling you may experience.  5. Utilize ice to the shoulder 3-5 times minimum a day and additionally if you are experiencing pain.  6. Prescriptions for a pain medication and a muscle relaxant are provided for you. It is recommended that if you are experiencing pain that you pain medication alone is not controlling, add the muscle relaxant along with the pain medication which can give additional pain relief. The first 1-2 days  is generally the most severe of your pain and then should gradually decrease. As your pain lessens it is recommended that you decrease your use of the pain medications to an "as needed basis'" only and to always comply with the recommended dosages of the pain medications.  7. Pain medications can produce constipation along with their use. If you experience this, the use of an over the counter stool softener or laxative daily is recommended.   8. For most patients, if insurance allows, home health services to include therapy has been arranged.  9. For additional questions or concerns, please do not hesitate to call the office. If after hours there is an answering service to forward your concerns to the physician on call.  POST-OP EXERCISES  May allow arm to dangle and move elbow wrist and hand

## 2016-05-07 NOTE — Anesthesia Procedure Notes (Addendum)
Anesthesia Regional Block:  Interscalene brachial plexus block  Pre-Anesthetic Checklist: ,, timeout performed, Correct Patient, Correct Site, Correct Laterality, Correct Procedure, Correct Position, site marked, Risks and benefits discussed,  Surgical consent,  Pre-op evaluation,  At surgeon's request and post-op pain management  Laterality: Right     Needles:   Needle Type: Stimulator Needle - 80      Needle Gauge: 21 and 21 G    Additional Needles:  Procedures: ultrasound guided (picture in chart) Interscalene brachial plexus block Narrative:  Start time: 05/07/2016 3:20 PM End time: 05/07/2016 3:25 PM Injection made incrementally with aspirations every 5 mL.

## 2016-05-07 NOTE — Transfer of Care (Signed)
Immediate Anesthesia Transfer of Care Note  Patient: [name] Soto  Procedure(s) Performed: Procedure(s): RIGHT REVERSE TOTAL SHOULDER ARTHROPLASTY (Right)  Patient Location: PACU  Anesthesia Type:General  Level of Consciousness: awake  Airway & Oxygen Therapy: Patient Spontanous Breathing and Patient connected to nasal cannula oxygen  Post-op Assessment: Report given to RN and Post -op Vital signs reviewed and stable  Post vital signs: Reviewed and stable  Last Vitals:  Vitals:   05/07/16 1510 05/07/16 1515  BP: (!) 134/55 (!) 140/41  Pulse: 91 83  Resp: (!) 23 (!) 22  Temp:      Last Pain:  Vitals:   05/07/16 1515  TempSrc:   PainSc: 0-No pain      Patients Stated Pain Goal: 3 (05/07/16 1348)  Complications: No apparent anesthesia complications

## 2016-05-07 NOTE — Anesthesia Preprocedure Evaluation (Addendum)
Anesthesia Evaluation  Patient identified by MRN, date of birth, ID band Patient awake    Reviewed: Allergy & Precautions, NPO status , Patient's Chart, lab work & pertinent test results  Airway Mallampati: II  TM Distance: >3 FB Neck ROM: Full    Dental no notable dental hx. (+) Teeth Intact, Dental Advisory Given   Pulmonary neg pulmonary ROS, former smoker,    Pulmonary exam normal breath sounds clear to auscultation       Cardiovascular hypertension, + Peripheral Vascular Disease  Normal cardiovascular exam Rhythm:Regular Rate:Normal     Neuro/Psych CVA negative psych ROS   GI/Hepatic negative GI ROS, Neg liver ROS, GERD  ,  Endo/Other  diabetes  Renal/GU negative Renal ROS     Musculoskeletal negative musculoskeletal ROS (+)   Abdominal   Peds  Hematology negative hematology ROS (+)   Anesthesia Other Findings   Reproductive/Obstetrics                            Anesthesia Physical  Anesthesia Plan  ASA: III  Anesthesia Plan: General and Regional   Post-op Pain Management: GA combined w/ Regional for post-op pain   Induction: Intravenous  Airway Management Planned: Oral ETT  Additional Equipment:   Intra-op Plan:   Post-operative Plan: Extubation in OR  Informed Consent: I have reviewed the patients History and Physical, chart, labs and discussed the procedure including the risks, benefits and alternatives for the proposed anesthesia with the patient or authorized representative who has indicated his/her understanding and acceptance.   Dental advisory given  Plan Discussed with: CRNA and Anesthesiologist  Anesthesia Plan Comments:       Anesthesia Quick Evaluation

## 2016-05-07 NOTE — Anesthesia Postprocedure Evaluation (Signed)
Anesthesia Post Note  Patient: [name] Soto  Procedure(s) Performed: Procedure(s) (LRB): RIGHT REVERSE TOTAL SHOULDER ARTHROPLASTY (Right)  Patient location during evaluation: PACU Anesthesia Type: General and Regional Level of consciousness: awake and alert, oriented and patient cooperative Pain management: pain level controlled Vital Signs Assessment: post-procedure vital signs reviewed and stable Respiratory status: spontaneous breathing, nonlabored ventilation, respiratory function stable and patient connected to nasal cannula oxygen Cardiovascular status: blood pressure returned to baseline and stable Postop Assessment: no signs of nausea or vomiting Anesthetic complications: no    Last Vitals:  Vitals:   05/07/16 1940 05/07/16 1945  BP: 110/64   Pulse: 85 68  Resp: (!) 26 19  Temp:      Last Pain:  Vitals:   05/07/16 1515  TempSrc:   PainSc: 0-No pain                 Byrd Rushlow,E. Renai Lopata

## 2016-05-07 NOTE — H&P (Signed)
[name] OldsJames H Swingle    Chief Complaint: FOUR PART HUMEROUS FRACTURE HPI: The patient is a 70 y.o. male with severely comminuted and displaced right 4 part proximal humerus fracture  Past Medical History:  Diagnosis Date  . Cerebral thrombosis with cerebral infarction (HCC)   . Diabetes mellitus without complication (HCC)   . GERD (gastroesophageal reflux disease)   . Humerus fracture    PROXIMAL RIGHT  . Hypertension   . Pancreatic lesion   . Stroke [facility](HCC)     Past Surgical History:  Procedure Laterality Date  . CATARACT EXTRACTION     RIGHT  . EUS N/A 07/17/2015   Procedure: ESOPHAGEAL ENDOSCOPIC ULTRASOUND (EUS) RADIAL;  Surgeon: Willis ModenaWilliam Outlaw, MD;  Location: [facility];  Service: Endoscopy;  Laterality: N/A;  . FINE NEEDLE ASPIRATION N/A 07/17/2015   Procedure: FINE NEEDLE ASPIRATION (FNA) RADIAL;  Surgeon: Willis ModenaWilliam Outlaw, MD;  Location: [facility] ENDOS[facility];  Service: Endoscopy;  Laterality: N/A;  . HEMATOMA DRAINED    . HERNIA REPAIR     inguinal right  . HYDROCELE EXCISION      History reviewed. No pertinent family history.  Social History:  reports that he has quit smoking. His smoking use included Cigarettes. He has never used smokeless tobacco. He reports that he does not drink alcohol or use drugs.   Medications Prior to Admission  Medication Sig Dispense Refill  . amLODipine (NORVASC) 5 MG tablet Take 5 mg by mouth every morning.    [name]. aspirin 81 MG tablet Take 81 mg by mouth daily.    [name]. atorvastatin (LIPITOR) 40 MG tablet Take 40 mg by mouth daily.    . Bromfenac Sodium (PROLENSA) 0.07 % SOLN Place 1 drop into the right eye daily.    . Difluprednate (DUREZOL) 0.05 % EMUL Place 1 drop into the right eye daily.    [name]. lisinopril (PRINIVIL,ZESTRIL) 40 MG tablet Take 40 mg by mouth 2 (two) times daily.    . Multiple Vitamin (MULTI-VITAMINS) TABS Take 1 tablet by mouth daily.    [name]. oxyCODONE-acetaminophen (PERCOCET) 5-325 MG tablet Take 1 tablet by mouth every 4 (four) hours as  needed for severe pain. 20 tablet 0     Physical Exam: right shoulder with diffuse swelling and tenderness, grossly n/v intact distally  Vitals  Temp:  [98.2 F (36.8 C)] 98.2 F (36.8 C) (08/24 1355) Pulse Rate:  [74-94] 83 (08/24 1515) Resp:  [18-23] 22 (08/24 1515) BP: (134-141)/(41-56) 140/41 (08/24 1515) SpO2:  [86 %-100 %] 100 % (08/24 1515) Weight:  [94.3 kg (208 lb)] 94.3 kg (208 lb) (08/24 1355)  Assessment/Plan  Impression: severely displaced right FOUR PARTproximal HUMERUS FRACTURE  Plan of Action: Procedure(s): RIGHT REVERSE SHOULDER ARTHROPLASTY  Shashana Fullington M Nalu Troublefield 05/07/2016, 3:37 PM Contact # [phone]

## 2016-05-07 NOTE — Anesthesia Procedure Notes (Addendum)
Procedure Name: Intubation Date/Time: 05/07/2016 4:24 PM Performed by: Willeen Cass P Pre-anesthesia Checklist: Patient identified, Emergency Drugs available, Suction available, Patient being monitored and Timeout performed Patient Re-evaluated:Patient Re-evaluated prior to inductionOxygen Delivery Method: Circle system utilized Preoxygenation: Pre-oxygenation with 100% oxygen Intubation Type: IV induction Ventilation: Two handed mask ventilation required and Oral airway inserted - appropriate to patient size Laryngoscope Size: Mac and 4 Grade View: Grade I Tube type: Oral Tube size: 7.5 mm Number of attempts: 1 Airway Equipment and Method: Stylet and LTA kit utilized Placement Confirmation: ETT inserted through vocal cords under direct vision,  positive ETCO2 and breath sounds checked- equal and bilateral Secured at: 23 cm Tube secured with: Tape Dental Injury: Teeth and Oropharynx as per pre-operative assessment  Comments: Difficult mask d/t beard, easy MV with 2 handed mask and OPA.

## 2016-05-08 LAB — GLUCOSE, CAPILLARY: Glucose-Capillary: 114 mg/dL — ABNORMAL HIGH (ref 65–99)

## 2016-05-08 MED ORDER — ONDANSETRON HCL 4 MG PO TABS: 4.0000 mg | ORAL_TABLET | Freq: Three times a day (TID) | ORAL | 0 refills | Status: DC | PRN

## 2016-05-08 MED ORDER — OXYCODONE-ACETAMINOPHEN 5-325 MG PO TABS: 1.0000 | ORAL_TABLET | ORAL | 0 refills | Status: DC | PRN

## 2016-05-08 NOTE — Progress Notes (Signed)
Pt discharge education and instructions completed with pt and spouse at bedside; both voices understanding and denies any questions. Pt IV removed; RUE incision dsg remains clean, dry and intact with no stain or active bleeding noted. Pt sling remains on; pt discharge home with spouse to transport him home. Pt handed his prescription for percocet and zofran. Pt transported off unit via wheelchair with spouse and belongings to the side. Dionne BucyP. Amo Wavie Hashimi RN

## 2016-05-08 NOTE — Op Note (Signed)
NAMEMarland Soto  MAXIMILLIAN, [name] NO.:  [id number]  MEDICAL RECORD NO.:  [id number]  LOCATION:  5N04C                        FACILITY:  [facility]  PHYSICIAN:  [name] Soto. [name] Soto, M.D.  DATE OF BIRTH:  12/25/1945  DATE OF PROCEDURE:  05/07/2016 DATE OF DISCHARGE:                              OPERATIVE REPORT   PREOPERATIVE DIAGNOSIS:  Comminuted and displaced right 4-part proximal humerus fracture.  POSTOPERATIVE DIAGNOSIS:  Comminuted and displaced right 4-part proximal humerus fracture.  PROCEDURE:  Right shoulder reverse arthroplasty utilizing a cemented size 7 Arthrex stem with a +6 polyethylene insert and a 39+ 4 glenosphere.  The stem was set at a 135 degrees angle.  SURGEON:  [name] Soto. Anique Soto, M.D.  [name] Soto, P.A.-C.  ANESTHESIA:  General endotracheal as well as interscalene block.  ESTIMATED BLOOD LOSS:  400 mL.  DRAINS:  None.  HISTORY:  [name] Soto is a 70 year old gentleman, who approximately a week and a half ago fell, sustaining a severely-impacted and displaced right 4-part proximal humerus fracture.  Evaluation in our office yesterday was found to have marked swelling, global tenderness about the right shoulder with profound loss of mobility was grossly, neurovascularly intact.  Plain radiographs as well as CT scan reviewed, which showed severe displacement with a head-splitting element to his complex fracture, which was severely displaced and impacted.  Due to the degree of bony displacement and the fracture pattern, he was brought to the operating room at this time for planned reverse shoulder arthroplasty.  Preoperatively, I counseled Mr. [name] Soto regarding treatment options and potential risks versus benefits thereof.  Possible surgical complications were reviewed including bleeding, infection, neurovascular injury, persistent pain, loss of motion, failure of the implant, anesthetic complication, possible need for additional surgery.   He understands and accepts and agrees with our planned procedure.  PROCEDURE IN DETAIL:  After undergoing routine preop evaluation, the patient received prophylactic antibiotics and an interscalene block was established in the holding area by the Anesthesia Department.  Placed supine on the operating table, underwent smooth induction of a general endotracheal anesthesia.  Placed in beach-chair position and appropriately padded and protected.  Right shoulder examination showed that he already developed significant candidiasis and marked skin irritation in the axilla.  This area was carefully cleaned and prepped. The right shoulder girdle region was then sterilely prepped and draped in standard fashion.  Time-out was called.  An anterior deltopectoral approach to right shoulder was made through an 8-cm incision.  Skin flaps were elevated.  Dissection carried deeply.  Electrocautery was used for hemostasis.  The deltopectoral interval was then developed from proximal to distal with upper 1.5 cm of the pec major tenotomized to enhance exposure.  Conjoined tendon was mobilized, retracted medially, self-retaining retractors were placed.  There was marked deformity with the humeral head being completely subluxed off the apex of the humeral shaft posteriorly.  I identified the Soto-head biceps tendon, which we unroofed, tenotomized this and then used as a guide to help [place] separate between the greater and lesser tuberosities and ultimately was able to tag the tendon end of the subscapularis separating this from the articular segment and removed the  articular portion of the humeral head and three dominant fragments and then tagged the posterior rotator cuff with series of figure-of-eight #2 FiberWire sutures maintaining a fragment of bone at the bone-tendon junction of the greater tuberosity. Once we had the tuberosities tagged and mobilized, removed the remaining bone fragments from the articular  surface of the humeral head.  At this point, we gained circumferential exposure of the glenoid, removed the proximal stump of the biceps and then performed a circumferential labral resection to gain complete access to the glenoid.  I placed a guidepin into the center of the glenoid, used our central with starting reamer and then the peripheral reamer, obtained good bony margin.  We then impacted our small glenoid baseplate with a 20-mm central lag screw and inferior and superior locking screws, all of which, obtained excellent bony purchase and fixation.  We then applied the 39+ 4 glenosphere onto the baseplate.  This was impacted, confirming good stability.  At this point, we then returned our attention to the proximal humeral shaft where we hand reamed up to size 9.  We broached up to size 7 at 0 degrees retroversion, which had good proximal fit.  At this point, it was clear that we do not have enough metaphyseal bone to allow secure fixation and a press-fit application and so, I anticipated cementing the stem.  We did perform a trial reduction, which confirmed proper alignment and good soft tissue balance.  At this point, a size 3 cement plug was placed at the appropriate depth.  The canal was cleaned and dried.  We mixed the cement, introduced the cement into the canal, hand packing it.  The size 7 stem was assembled with the appropriate metaphysis and the stem was then directed down into the humeral canal to the proper level, maintaining approximately 0 degrees of retroversion, impacted and positioned.  All the extra cement was then carefully removed.  After the cement hardened, we then performed a series of repeat reductions and ultimately, we found a +6 poly gave us the best soft tissue balance.  The humeral stem was cleaned over the metaphyseal region.  The final +6 poly was impacted into position.  Final reduction was performed after we irrigated the joint.  We did obtain  final hemostasis using a TXA-soaked sponge and then, we performed our final reduction.  Overall, soft tissue balance was much to our satisfaction with excellent shoulder motion and good stability.  At this point, then, we repaired the tuberosities back to the metaphysis over implant using the #2 FiberWire through the eyelets on the collar of the stem allowing nice reapposition of the soft tissues about the implant.  The deltopectoral interval was then reapproximated with a series of figure- of-eight #1 Vicryl sutures.  2-0 Vicryl was used for subcu layer, intracuticular 3-0 Monocryl for the skin followed by Dermabond and Aquacel dressing.  Right arm was placed in a sling.  The patient was then awakened, extubated, and taken to the recovery room in stable condition.  Ralene Batheracy Shuford, PA-C was used as an Geophysicist/field seismologistassistant throughout this case, was essential for help with positioning the patient, positioning the extremity, management of the tissue retractors, implantation of prosthesis, wound closure and intraoperative decision making.     [name] ReaKevin M. Timea Breed, M.D.     KMS/MEDQ  D:  05/07/2016  T:  05/08/2016  Job:  [id number]

## 2016-05-08 NOTE — Evaluation (Signed)
Physical Therapy Evaluation Patient Details Name: [name] Soto MRN: [id number] DOB: January 05, 1946 Today's Date: 05/08/2016   History of Present Illness  70 y.o. male now s/p reverse total shoulder arthroplasty due to severely comminuted and displaced humerus fx sustained following a fall. PMH: stroke, HTN, diabetes.   Clinical Impression  Pt with noted instability during ambulation which improved as the session continued. Recommended to pt and spouse that assistance be provided with all mobility at this time. Pt and spouse verbalized understanding of recommendation. Anticipate pt will D/C to home with spouse assistance once released. PT to follow acutely.     Follow Up Recommendations Home health PT;Supervision for mobility/OOB    Equipment Recommendations  None recommended by PT    Recommendations for Other Services       Precautions / Restrictions Precautions Precautions: Fall;Shoulder Required Braces or Orthoses: Sling Restrictions Weight Bearing Restrictions: Yes RUE Weight Bearing: Non weight bearing      Mobility  Bed Mobility Overal bed mobility: Needs Assistance Bed Mobility: Supine to Sit     Supine to sit: HOB elevated;Min guard     General bed mobility comments: HOB elevated and using rail with LUE. Pt states that he will be sleeping in his recliner chair.   Transfers Overall transfer level: Needs assistance Equipment used: None Transfers: Sit to/from Stand Sit to Stand: Min guard         General transfer comment: guard for stability with initial standing.  Ambulation/Gait Ambulation/Gait assistance: Min guard;Min assist Ambulation Distance (Feet): 175 Feet Assistive device: Straight cane Gait Pattern/deviations: Step-through pattern;Decreased step length - right;Decreased step length - left;Wide base of support Gait velocity: decreased   General Gait Details: Pt maintaining a wide base throughout session. Pt had 3 loss of balance using therapist or  wall for support. Stability did improve as time progressed but continued to need min guard assist. Recommended to pt and spouse to have assistance with all mobility at this time for safety.   Stairs Stairs: Yes Stairs assistance: Min guard Stair Management: Forwards;Step to pattern;One rail Left Number of Stairs: 2 General stair comments: Pt without loss of balance on stairs, reports feeling confident to perform at home. Recommended having spouse with him for safety.   Wheelchair Mobility    Modified Rankin (Stroke Patients Only)       Balance Overall balance assessment: Needs assistance Sitting-balance support: No upper extremity supported Sitting balance-Leahy Scale: Good     Standing balance support: No upper extremity supported Standing balance-Leahy Scale: Fair                               Pertinent Vitals/Pain Pain Assessment: 0-10 Pain Score: 3  Pain Location: rt shoulder Pain Descriptors / Indicators: Sore Pain Intervention(s): Limited activity within patient's tolerance;Monitored during session;Ice applied    Home Living Family/patient expects to be discharged to:: Private residence Living Arrangements: Spouse/significant other Available Help at Discharge: Family;Available 24 hours/day Type of Home: House Home Access: Stairs to enter Entrance Stairs-Rails: Left (holds onto post) Entrance Stairs-Number of Steps: 2 Home Layout: One level Home Equipment: Cane - single point      Prior Function Level of Independence: Independent         Comments: reports occasionally using SPC     Hand Dominance   Dominant Hand: Right    Extremity/Trunk Assessment   Upper Extremity Assessment: Defer to OT evaluation  Lower Extremity Assessment: Overall WFL for tasks assessed   LLE Deficits / Details: Pt reports LLE weaker than right due to previous stroke      Communication   Communication: No difficulties  Cognition  Arousal/Alertness: Awake/alert Behavior During Therapy: WFL for tasks assessed/performed Overall Cognitive Status: Within Functional Limits for tasks assessed                      General Comments General comments (skin integrity, edema, etc.): Recommended to pt and spouse that assistance be provided with all mobility at this time. Pt and spouse verbalize understanding and deny questions. Also discussed home safety including no throw rugs around the home.     Exercises        Assessment/Plan    PT Assessment Patient needs continued PT services  PT Diagnosis Difficulty walking   PT Problem List Decreased strength;Decreased range of motion;Decreased activity tolerance;Decreased balance;Decreased mobility  PT Treatment Interventions DME instruction;Gait training;Stair training;Functional mobility training;Therapeutic activities;Therapeutic exercise;Patient/family education   PT Goals (Current goals can be found in the Care Plan section) Acute Rehab PT Goals Patient Stated Goal: go home PT Goal Formulation: With patient Time For Goal Achievement: 05/15/16 Potential to Achieve Goals: Good    Frequency Min 5X/week   Barriers to discharge        Co-evaluation               End of Session Equipment Utilized During Treatment: Gait belt Activity Tolerance: Patient tolerated treatment well Patient left: in chair;with call bell/phone within reach;with family/visitor present Nurse Communication: Mobility status    Functional Assessment Tool Used: clinical judgment Functional Limitation: Mobility: Walking and moving around Mobility: Walking and Moving Around Current Status [phone]([id number]): At least 20 percent but less than 40 percent impaired, limited or restricted Mobility: Walking and Moving Around Goal Status [phone](G8979): At least 1 percent but less than 20 percent impaired, limited or restricted    Time: 0850-0929 PT Time Calculation (min) (ACUTE ONLY): 39 min   Charges:    PT Evaluation $PT Eval Moderate Complexity: 1 Procedure PT Treatments $Gait Training: 8-22 mins   PT G Codes:   PT G-Codes **NOT FOR INPATIENT CLASS** Functional Assessment Tool Used: clinical judgment Functional Limitation: Mobility: Walking and moving around Mobility: Walking and Moving Around Current Status ([phone]): At least 20 percent but less than 40 percent impaired, limited or restricted Mobility: Walking and Moving Around Goal Status [phone]([id number]): At least 1 percent but less than 20 percent impaired, limited or restricted    Christiane HaBenjamin J. Eustacia Urbanek, PT, CSCS Pager [phone] Office [phone]  05/08/2016, 9:46 AM

## 2016-05-08 NOTE — Evaluation (Signed)
Occupational Therapy Evaluation and Discharge Patient Details Name: [name] Soto MRN: [id number] DOB: 07/26/1946 Today's Date: 05/08/2016    History of Present Illness 70 y.o. male now s/p reverse total shoulder arthroplasty due to severely comminuted and displaced humerus fx sustained following a fall. PMH: stroke, HTN, diabetes.    Clinical Impression   Pt and wife educated in positioning R UE in bed and chair, sling use, parameters for shoulder movement for ADL only, compensatory strategies for ADL and fall prevention. Pt performed AROM of R elbow, forearm, wrist and hand. Reinforced instruction with handout. Pt and wife with no further questions.     Follow Up Recommendations  No OT follow up;Supervision/Assistance - 24 hour    Equipment Recommendations  None recommended by OT    Recommendations for Other Services       Precautions / Restrictions Precautions Precautions: Fall;Shoulder Type of Shoulder Precautions: passive protocol, shoulder movement for ADL: FF 60 degrees, abd 45 degrees, ER 10 degrees Shoulder Interventions: Shoulder sling/immobilizer;Off for dressing/bathing/exercises Precaution Booklet Issued: Yes (comment) Required Braces or Orthoses: Sling Restrictions Weight Bearing Restrictions: Yes RUE Weight Bearing: Non weight bearing      Mobility Bed Mobility      General bed mobility comments: pt in chair, plans to sleep in recliner  Transfers Overall transfer level: Needs assistance Equipment used: None Transfers: Sit to/from Stand Sit to Stand: Min guard         General transfer comment: guard for stability with initial standing.    Balance Overall balance assessment: Needs assistance Sitting-balance support: No upper extremity supported Sitting balance-Leahy Scale: Good     Standing balance support: No upper extremity supported Standing balance-Leahy Scale: Fair                              ADL Overall ADL's : Needs  assistance/impaired Eating/Feeding: Set up;Sitting Eating/Feeding Details (indicate cue type and reason): instructed that pt may eat using R UE as per PA recommendations Grooming: Wash/dry hands;Standing;Min guard   Upper Body Bathing: Minimal assitance;Standing   Lower Body Bathing: Sit to/from stand;Minimal assistance   Upper Body Dressing : Minimal assistance;Sitting   Lower Body Dressing: Minimal assistance;Sit to/from stand   Toilet Transfer: Min guard;Ambulation   Toileting- Clothing Manipulation and Hygiene: Min guard;Sit to/from stand       Functional mobility during ADLs: Min guard General ADL Comments: Educated wife and pt in positioning R UE bed and chair, sling use, compensatory strategies for ADL and fall prevention. Recommended pt have close supervision when ambulating at least initially due to unsteadiness from previous CVA and medications     Vision     Perception     Praxis      Pertinent Vitals/Pain Pain Assessment: Faces Pain Score: 3  Faces Pain Scale: Hurts little more Pain Location: R shoulder  Pain Descriptors / Indicators: Guarding;Grimacing;Sore Pain Intervention(s): Monitored during session;Limited activity within patient's tolerance;Premedicated before session;Repositioned;Ice applied     Hand Dominance Right   Extremity/Trunk Assessment Upper Extremity Assessment Upper Extremity Assessment: RUE deficits/detail RUE Deficits / Details: performed elbow to hand AROM x 10 RUE: Unable to fully assess due to immobilization;Unable to fully assess due to pain RUE Coordination: decreased gross motor   Lower Extremity Assessment Lower Extremity Assessment: Defer to PT evaluation LLE Deficits / Details: Pt reports LLE weaker than right due to previous stroke        Communication Communication Communication: No difficulties  Cognition Arousal/Alertness: Lethargic;Suspect due to medications Behavior During Therapy: [facility] for tasks  assessed/performed Overall Cognitive Status: Within Functional Limits for tasks assessed                     General Comments       Exercises       Shoulder Instructions      Home Living Family/patient expects to be discharged to:: Private residence Living Arrangements: Spouse/significant other Available Help at Discharge: Family;Available 24 hours/day Type of Home: House Home Access: Stairs to enter Enter[employer] CorporationEntrance Stairs-Number of Steps: 2 Entrance Stairs-Rails: Left Home Layout: One level               Home Equipment: Cane - single point          Prior Functioning/Environment Level of Independence: Independent        Comments: reports occasionally using SPC    OT Diagnosis: Generalized weakness;Acute pain   OT Problem List:     OT Treatment/Interventions:      OT Goals(Current goals can be found in the care plan section) Acute Rehab OT Goals Patient Stated Goal: go home  OT Frequency:     Barriers to D/C:            Co-evaluation              End of Session Equipment Utilized During Treatment: Gait belt (sling)  Activity Tolerance: Patient limited by lethargy Patient left: in chair;with call bell/phone within reach;with family/visitor present   Time: [id number] OT Time Calculation (min): 23 min Charges:  OT General Charges $OT Visit: 1 Procedure OT Evaluation $OT Eval Moderate Complexity: 1 Procedure OT Treatments $Self Care/Home Management : 8-22 mins G-Codes:    Evern BioMayberry, Joyanne Eddinger Lynn 05/08/2016, 11:18 AM  [phone]

## 2016-05-08 NOTE — Consult Note (Addendum)
WOC Nurse wound consult note Reason for Consult: assess axillary rash Wound type: Intertriginous Dermatitis Pressure Ulcer POA: No Measurement:Unable to measure due to pt has shoulder surgery and is unable to abduct shoulder Wound ZOX:WRUEAVWUbed:reddened moist area where arm was put in sling and wrapped to body to keep from moving while awaiting surgery Drainage (amount, consistency, odor) no drainage, no odor Periwound:intact Dressing procedure/placement/frequency: Use house antimicrobiatile (InterDry Ag+) according to the instructions below. Wash skin gently, pat dry, do not rub.  With clean scissors, cut enough fabric to cover the affected area under right axilla, allowing for a minimum of 2 inches to extend beyond the skin fold for moisture evaporation.  Lay a single layer of fabric in the skin fold, placing one edge into the base of the fold and top of axilla. We will not follow, but will remain available to this patient, to nursing, and the medical teams.    Barnett HatterMelinda Jameriah Trotti, RN-C, WTA-C Wound Treatment Associate

## 2016-05-08 NOTE — Discharge Summary (Signed)
PATIENT ID:      [name] Soto  MRN:     [id number] DOB/AGE:    [birth year] / 70 y.o.     DISCHARGE SUMMARY  ADMISSION DATE:    05/07/2016 DISCHARGE DATE:    ADMISSION DIAGNOSIS: FOUR PART HUMERUS FRACTURE Past Medical History:  Diagnosis Date  . Cerebral thrombosis with cerebral infarction (HCC)   . Diabetes mellitus without complication (HCC)   . GERD (gastroesophageal reflux disease)   . Humerus fracture    PROXIMAL RIGHT  . Hypertension   . Pancreatic lesion   . Stroke [facility])     DISCHARGE DIAGNOSIS:   Active Problems:   S/p reverse total shoulder arthroplasty   PROCEDURE: Procedure(s): RIGHT REVERSE TOTAL SHOULDER ARTHROPLASTY on 05/07/2016  CONSULTS:    HISTORY:  See H&P in chart.  HOSPITAL COURSE:  [name] Soto is a 70 y.o. admitted on 05/07/2016 with a diagnosis of FOUR PART HUMERUS FRACTURE.  They were brought to the operating room on 05/07/2016 and underwent Procedure(s): RIGHT REVERSE TOTAL SHOULDER ARTHROPLASTY.    They were given perioperative antibiotics: Anti-infectives    Start     Dose/Rate Route Frequency Ordered Stop   05/08/16 0600  ceFAZolin (ANCEF) IVPB 2g/100 mL premix     2 g 200 mL/hr over 30 Minutes Intravenous On call to O.R. 05/07/16 1330 05/07/16 1632   05/07/16 2230  ceFAZolin (ANCEF) IVPB 2g/100 mL premix     2 g 200 mL/hr over 30 Minutes Intravenous Every 6 hours 05/07/16 2016 05/08/16 1629   05/07/16 1331  ceFAZolin (ANCEF) 2-4 GM/100ML-% IVPB  Status:  Discontinued    Comments:  Macon Large   : cabinet override      05/07/16 1331 05/07/16 1337   05/07/16 1330  ceFAZolin (ANCEF) 2-4 GM/100ML-% IVPB    Comments:  Marrianne Mood   : cabinet override      05/07/16 1330 05/08/16 0144    .  Patient underwent the above named procedure and tolerated it well. The following day they were hemodynamically stable and pain was controlled on oral analgesics. They were neurovascularly intact to the operative extremity. OT was ordered and worked  with patient per protocol. They were medically and orthopaedically stable for discharge on day 1.    DIAGNOSTIC STUDIES:  RECENT RADIOGRAPHIC STUDIES :  Dg Shoulder Right  Result Date: 04/27/2016 CLINICAL DATA:  Fall on sidewalk after leaving bank today. Landed on right shoulder. Right shoulder pain. EXAM: RIGHT SHOULDER - 2+ VIEW COMPARISON:  None. FINDINGS: A comminuted right humeral neck and humeral head fracture is present. There may be a fracture of the distal clavicle is well. The scapula appears to be intact. Right hemi thorax is intact. IMPRESSION: 1. Comminuted proximal right humerus fracture involving the humeral head. 2. Question nondisplaced distal clavicle fracture. Electronically Signed   By: Marin Roberts M.D.   On: 04/27/2016 19:04   Ct Shoulder Right Wo Contrast  Result Date: 04/29/2016 CLINICAL DATA:  Fall on right shoulder, fracture. EXAM: CT OF THE RIGHT SHOULDER WITHOUT CONTRAST TECHNIQUE: Multidetector CT imaging was performed according to the standard protocol. Multiplanar CT image reconstructions were also generated. COMPARISON:  04/27/2016 FINDINGS: Surgical neck fracture right proximal humerus, 2.4 cm anterior displacement of the shaft fragment with respect to the humeral head fragment. Comminution along the fracture plane. Some of this extends into the humeral head and along the greater tuberosity. The greater tuberosity fracture plane is only displaced up to about 5 mm and accordingly does  not count as a separate "part." Thus I consider this a 2 part fracture. Joint effusion. No scapular or clavicular fracture identified. I do not observe an adjacent rib fracture. There is bruising anterior to the pectoralis musculature. Oval-shaped 0.6 by 0.4 cm nodule adjacent to several adjacent 3-4 mm nodules, images 83 through 86 series 3 in the right upper lobe. Calcified nodule in the superior segment right lower lobe, image 91 series 3. Thoracic spondylosis. Atherosclerotic  calcification of the aortic arch. IMPRESSION: 1. Two part fracture of the right proximal humerus, with displacement along the surgical neck fracture site. There is some comminuted fracture extending into the greater tuberosity and humeral head but not displaced and thus not qualifying as separate parts. 2. Surrounding joint effusion and soft tissue hematoma. 3. Several clustered nodules in the right upper lobe, largest has an average size of 0.5 cm. No follow-up needed if patient is low-risk. Non-contrast chest CT can be considered in 12 months if patient is high-risk. This recommendation follows the consensus statement: Guidelines for Management of Incidental Pulmonary Nodules Detected on CT Images:From the Fleischner Society 2017; published online before print (10.1148/radiol.1610960454). 4. Atherosclerotic aortic arch. Electronically Signed   By: Gaylyn Rong M.D.   On: 04/29/2016 15:47    RECENT VITAL SIGNS:  Patient Vitals for the past 24 hrs:  BP Temp Temp src Pulse Resp SpO2 Height Weight  05/08/16 0455 (!) 142/62 98.3 F (36.8 C) Oral 79 - 93 % - -  05/08/16 0125 (!) 120/45 97.7 F (36.5 C) Oral 84 17 95 % - -  05/07/16 2013 128/60 97.8 F (36.6 C) Oral 73 18 95 % - -  05/07/16 2000 - 98.3 F (36.8 C) - - - - - -  05/07/16 1954 117/65 - - 74 (!) 21 96 % - -  05/07/16 1945 - - - 68 19 96 % - -  05/07/16 1940 110/64 - - 85 (!) 26 98 % - -  05/07/16 1930 - - - 74 19 94 % - -  05/07/16 1915 - - - 64 19 94 % - -  05/07/16 1909 (!) 99/59 - - 70 19 99 % - -  05/07/16 1900 - 98.4 F (36.9 C) - 63 18 100 % - -  05/07/16 1515 (!) 140/41 - - 83 (!) 22 100 % - -  05/07/16 1510 (!) 134/55 - - 91 (!) 23 (!) 86 % - -  05/07/16 1505 (!) 141/47 - - 94 (!) 22 99 % - -  05/07/16 1500 (!) 138/56 - - 94 19 97 % - -  05/07/16 1355 (!) 140/53 98.2 F (36.8 C) Oral 74 18 97 % 5\' 10"  (1.778 m) 94.3 kg (208 lb)  .  RECENT EKG RESULTS:    Orders placed or performed during the hospital encounter of  05/07/16  . EKG 12-Lead  . EKG 12-Lead    DISCHARGE INSTRUCTIONS:  Discharge Instructions    Discontinue IV    Complete by:  As directed      DISCHARGE MEDICATIONS:     Medication List    TAKE these medications   amLODipine 5 MG tablet Commonly known as:  NORVASC Take 5 mg by mouth every morning.   aspirin 81 MG tablet Take 81 mg by mouth daily.   atorvastatin 40 MG tablet Commonly known as:  LIPITOR Take 40 mg by mouth daily.   DUREZOL 0.05 % Emul Generic drug:  Difluprednate Place 1 drop into the right eye daily.  lisinopril 40 MG tablet Commonly known as:  PRINIVIL,ZESTRIL Take 40 mg by mouth 2 (two) times daily.   MULTI-VITAMINS Tabs Take 1 tablet by mouth daily.   ondansetron 4 MG tablet Commonly known as:  ZOFRAN Take 1 tablet (4 mg total) by mouth every 8 (eight) hours as needed for nausea or vomiting.   oxyCODONE-acetaminophen 5-325 MG tablet Commonly known as:  PERCOCET Take 1 tablet by mouth every 4 (four) hours as needed for severe pain. What changed:  Another medication with the same name was added. Make sure you understand how and when to take each.   oxyCODONE-acetaminophen 5-325 MG tablet Commonly known as:  PERCOCET Take 1-2 tablets by mouth every 4 (four) hours as needed. What changed:  You were already taking a medication with the same name, and this prescription was added. Make sure you understand how and when to take each.   PROLENSA 0.07 % Soln Generic drug:  Bromfenac Sodium Place 1 drop into the right eye daily.       FOLLOW UP VISIT:   Follow-up Information    Vania ReaKEVIN M SUPPLE, MD.   Specialty:  Orthopedic Surgery Why:  call to be seen in 10-14 days Contact information: [address] [place] [address] [phone]           DISCHARGE TO: Home  DISPOSITION: Good  DISCHARGE CONDITION:  Rodolph BongGood   Shawna Wearing for Dr. Francena HanlyKevin Supple 05/08/2016, 8:42 AM

## 2016-05-11 ENCOUNTER — Encounter (HOSPITAL_COMMUNITY): Payer: Self-pay | Admitting: Orthopedic Surgery

## 2016-05-11 NOTE — OR Nursing (Signed)
Implant added to operative record from case on 05/07/16 to match vendor charge sheet signed off by K. Pete GlatterHolley, RN who was the circulating nurse for the case.

## 2016-06-23 ENCOUNTER — Ambulatory Visit: Payer: Medicare Other | Attending: Orthopedic Surgery | Admitting: Physical Therapy

## 2016-06-23 DIAGNOSIS — M25511 Pain in right shoulder: Secondary | ICD-10-CM | POA: Diagnosis not present

## 2016-06-23 DIAGNOSIS — M25611 Stiffness of right shoulder, not elsewhere classified: Secondary | ICD-10-CM | POA: Insufficient documentation

## 2016-06-23 DIAGNOSIS — M6281 Muscle weakness (generalized): Secondary | ICD-10-CM | POA: Diagnosis present

## 2016-06-23 DIAGNOSIS — R293 Abnormal posture: Secondary | ICD-10-CM | POA: Diagnosis present

## 2016-06-23 NOTE — Patient Instructions (Signed)
   Pendulum Circular    Bend forward 90 at waist, leaning on table for support. Rock body in a circular pattern to move arm clockwise __15-20__ times then counterclockwise __15-20__ times. Do _several___ sessions per day.  Copyright  VHI. All rights reserved.  Pendulum Forward/Back    Bend forward 90 at waist, using table for support. Rock body forward and back to swing arm. Repeat _15-20___ times. Do _several___ sessions per day.  Copyright  VHI. All rights reserved.  Pendulum Side to Side    Bend forward 90 at waist, leaning on table for support. Rock body from side to side and let arm swing freely. Repeat __15-20__ times. Do __several__ sessions per day.  Copyright  VHI. All rights reserved.

## 2016-06-23 NOTE — Therapy (Signed)
[facility] [address]  [address] [place], [place], [address] Phone: [phone]   Fax:  [phone]  Physical Therapy Evaluation  Patient Details  Name: [name] Soto MRN: [id number] Date of Birth: [birth year] Referring Provider: Dr. Francena Hanly  Encounter Date: 06/23/2016      PT End of Session - 06/23/16 1240    Visit Number 1   Number of Visits 16   Date for PT Re-Evaluation 08/18/16   PT Start Time 0930   PT Stop Time 1008   PT Time Calculation (min) 38 min   Activity Tolerance Patient tolerated treatment well   Behavior During Therapy [facility] for tasks assessed/performed      Past Medical History:  Diagnosis Date  . Cerebral thrombosis with cerebral infarction (HCC)   . Diabetes mellitus without complication (HCC)   . GERD (gastroesophageal reflux disease)   . Humerus fracture    PROXIMAL RIGHT  . Hypertension   . Pancreatic lesion   . Stroke [facility])     Past Surgical History:  Procedure Laterality Date  . CATARACT EXTRACTION     RIGHT  . EUS N/A 07/17/2015   Procedure: ESOPHAGEAL ENDOSCOPIC ULTRASOUND (EUS) RADIAL;  Surgeon: Willis Modena, MD;  Location: [facility] ENDOSCOPY;  Service: Endoscopy;  Laterality: N/A;  . FINE NEEDLE ASPIRATION N/A 07/17/2015   Procedure: FINE NEEDLE ASPIRATION (FNA) RADIAL;  Surgeon: Willis Modena, MD;  Location: [facility] ENDOSCOPY;  Service: Endoscopy;  Laterality: N/A;  . HEMATOMA DRAINED    . HERNIA REPAIR     inguinal right  . HYDROCELE EXCISION    . REVERSE SHOULDER ARTHROPLASTY Right 05/07/2016   Procedure: RIGHT REVERSE TOTAL SHOULDER ARTHROPLASTY;  Surgeon: Francena Hanly, MD;  Location: [facility] OR;  Service: Orthopedics;  Laterality: Right;    There were no vitals filed for this visit.       Subjective Assessment - 06/23/16 0930    Subjective Pt is a 70 y/o male who presents to OPPT s/p R reverse shoulder arthroplasty due to R humerus fx on 04/27/16.  Pt presents today with decreased  ADLs and UE functional use.   Pertinent History CVA (residual L sided weakness), DM, HTN, pancreatic lesion   Limitations Lifting;House hold activities   Patient Stated Goals improve use of R arm   Currently in Pain? No/denies   Pain Score --  c/o discomfort in upper arm - did not rate   Pain Location Shoulder   Pain Orientation Right   Pain Descriptors / Indicators Discomfort   Pain Type Surgical pain   Pain Radiating Towards upper arm   Pain Onset More than a month ago   Pain Frequency Intermittent   Aggravating Factors  hitting arm against object; trying to reach suddenly    Pain Relieving Factors avoiding aggravating factors            [facility] PT Assessment - 06/23/16 0935      Assessment   Medical Diagnosis R reverse total shoulder   Referring Provider Dr. Francena Hanly   Onset Date/Surgical Date 05/07/16   Hand Dominance Right   Next MD Visit 07/20/16   Prior Therapy none for shoulder; had PT after CVA     Precautions   Precautions Fall     Restrictions   Weight Bearing Restrictions No     Balance Screen   Has the patient fallen in the past 6 months Yes   How many times? 1   Has the patient had a  decrease in activity level because of a fear of falling?  No   Is the patient reluctant to leave their home because of a fear of falling?  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   Leisure walking     Observation/Other Assessments   Skin Integrity mild  increased redness and small raised portion at end of incision-recommended pt monitor and if increases in size or redness to call MD   Focus on Therapeutic Outcomes (FOTO)  38 (62% limited; predicted 34% limited)     Posture/Postural Control   Posture/Postural Control Postural limitations   Postural Limitations Rounded Shoulders;Forward head     PROM   PROM Assessment Site Shoulder   Right/Left Shoulder Right   Right Shoulder Flexion 90 Degrees   Right Shoulder ABduction 65 Degrees   Right Shoulder Internal Rotation 20 Degrees   Right Shoulder External Rotation 38 Degrees     Strength   Overall Strength Comments deferred at this time due to post op status                   [facility] Adult PT Treatment/Exercise - 06/23/16 0935      Exercises   Exercises Shoulder     Shoulder Exercises: ROM/Strengthening   Pendulum instructed in ant/post, lateral and circular motions     Manual Therapy   Manual Therapy Passive ROM   Manual therapy comments pt supine   Passive ROM R shoulder flexion, abduction, ir/er to tolerance                PT Education - 06/23/16 1238    Education provided Yes   Education Details pendulums   Person(s) Educated Patient;Spouse   Methods Explanation;Demonstration;Handout   Comprehension Verbalized understanding;Returned demonstration;Need further instruction          PT Short Term Goals - 06/23/16 1245      PT SHORT TERM GOAL #1   Title independent with initial HEP (07/21/16)   Time 4   Period Weeks   Status New     PT SHORT TERM GOAL #2   Title improve R shoulder PROM by 15 degrees all motions for improved mobility (07/21/16)   Time 4   Period Weeks   Status New           PT Long Term Goals - 06/23/16 1246      PT LONG TERM GOAL #1   Title independent with advanced HEP (08/18/16)   Time 8   Period Weeks   Status New     PT LONG TERM GOAL #2   Title demonstrate R shoulder AROM flexion 110 degrees; abduction 90 degrees  and internal rotation 50 degrees for improved function (08/18/16)   Time 8   Period Weeks   Status New     PT LONG TERM GOAL #3   Title demonstrate 4/5 strength in abduction, flexion and internal rotation for improved R shoulder function (08/18/16)   Time  8   Period Weeks   Status New     PT LONG TERM GOAL #4   Title independent with ADLs without increase in pain (08/18/16)   Time 8   Period Weeks   Status New               Plan - 06/23/16 1242    Clinical Impression Statement Pt is a 70 y/o male who presents to OPPT for moderate complexity PT eval s/p R reverse total shoulder arthroplasty due to fracture.  Pt demonstrates decreased ROM, postural abnormalities, and decreased strength affecting functional use of UE.  Will benefit from PT to address deficits listed.  Pt with hx of fall so will plan to address balance PRN.   Rehab Potential Good   PT Frequency 2x / week   PT Duration 8 weeks   PT Treatment/Interventions ADLs/Self Care Home Management;Cryotherapy;Electrical Stimulation;Moist Heat;Balance training;Therapeutic exercise;Therapeutic activities;Patient/family education;Gait training;Manual techniques;Passive range of motion;Vasopneumatic Device;Dry needling   PT Next Visit Plan review pendulums, manual; initiate cane exercises and isometrics   Consulted and Agree with Plan of Care Patient;Family member/caregiver   Family Member Consulted wife      Patient will benefit from skilled therapeutic intervention in order to improve the following deficits and impairments:  Pain, Impaired UE functional use, Decreased strength, Increased edema, Decreased balance, Decreased range of motion, Postural dysfunction  Visit Diagnosis: Acute pain of right shoulder - Plan: PT plan of care cert/re-cert  Stiffness of right shoulder, not elsewhere classified - Plan: PT plan of care cert/re-cert  Abnormal posture - Plan: PT plan of care cert/re-cert  Muscle weakness (generalized) - Plan:  PT plan of care cert/re-cert      G-Codes - 06/23/16 1252    Functional Assessment Tool Used FOTO 62% limited   Functional Limitation Carrying, moving and handling objects   Carrying, Moving and Handling Objects Current Status ([id number]) At least 60 percent but less than 80 percent impaired, limited or restricted   Carrying, Moving and Handling Objects Goal Status (U0454(G8985) At least 20 percent but less than 40 percent impaired, limited or restricted       Problem List Patient Active Problem List   Diagnosis Date Noted  . 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       Clarita CraneStephanie F Euretha Najarro, PT, DPT 06/23/16 12:54 PM    [facility] [address]  [address] [place], [place], [address] Phone: [phone]   Fax:  [phone]  Name: [name] Soto MRN: [id number] Date of Birth: April 11, 1946

## 2016-06-25 ENCOUNTER — Ambulatory Visit: Payer: Medicare Other

## 2016-06-25 DIAGNOSIS — R293 Abnormal posture: Secondary | ICD-10-CM

## 2016-06-25 DIAGNOSIS — M25511 Pain in right shoulder: Secondary | ICD-10-CM

## 2016-06-25 DIAGNOSIS — M25611 Stiffness of right shoulder, not elsewhere classified: Secondary | ICD-10-CM

## 2016-06-25 DIAGNOSIS — M6281 Muscle weakness (generalized): Secondary | ICD-10-CM

## 2016-06-25 NOTE — Therapy (Signed)
[facility] [place] [address]  [address] [place], [place], [address] Phone: [phone]   Fax:  [phone]  Physical Therapy Treatment  Patient Details  Name: [name] OldsJames H Soto MRN: [id number] Date of Birth: 1946-04-10 Referring Provider: Dr. Francena HanlyKevin Supple  Encounter Date: 06/25/2016      PT End of Session - 06/25/16 0855    Visit Number 2   Number of Visits 16   Date for PT Re-Evaluation 08/18/16   PT Start Time 0849   PT Stop Time 0930   PT Time Calculation (min) 41 min   Activity Tolerance Patient tolerated treatment well   Behavior [facility] for tasks assessed/performed      Past Medical History:  Diagnosis Date  . Cerebral thrombosis with cerebral infarction (HCC)   . Diabetes mellitus without complication (HCC)   . GERD (gastroesophageal reflux disease)   . Humerus fracture    PROXIMAL RIGHT  . Hypertension   . Pancreatic lesion   . Stroke [facility](HCC)     Past Surgical History:  Procedure Laterality Date  . CATARACT EXTRACTION     RIGHT  . EUS N/A 07/17/2015   Procedure: ESOPHAGEAL ENDOSCOPIC ULTRASOUND (EUS) RADIAL;  Surgeon: Willis ModenaWilliam Outlaw, MD;  [facility];  Service: Endoscopy;  Laterality: N/A;  . FINE NEEDLE ASPIRATION N/A 07/17/2015   Procedure: FINE NEEDLE ASPIRATION (FNA) RADIAL;  Surgeon: Willis ModenaWilliam Outlaw, MD;  Location: [facility];  Service: Endoscopy;  Laterality: N/A;  . HEMATOMA DRAINED    . HERNIA REPAIR     inguinal right  . HYDROCELE EXCISION    . REVERSE SHOULDER ARTHROPLASTY Right 05/07/2016   Procedure: RIGHT REVERSE TOTAL SHOULDER ARTHROPLASTY;  Surgeon: Francena HanlyKevin Supple, MD;  Location: [facility] OR;  Service: Orthopedics;  Laterality: Right;    There were no vitals filed for this visit.      Subjective Assessment - 06/25/16 0851    Subjective Pt. reporting he is able to sleep on the L side at this point with a pillow under R shoulder.  Pt. waking up every 45' to use bathroom and  reporting this leaves him tired throughout the day.     Patient Stated Goals improve use of R arm   Currently in Pain? No/denies   Pain Score 0-No pain   Multiple Pain Sites No       Today's treatment:  HEP review: R shoulder pendulums circles x 20 reps each way R shoulder Pendulums side<>side x 20 reps R shoulder pendulums forward/back x 20 reps  Manual:  R shoulder PROM all directions R shoulder gentle stretch all directions R shoulder posterior caudal mobs  R shoulder IR/ER isometrics with therapist resistance 5" x 10 reps  Therex: Supine R shoulder wand ER 5" x 20 reps  Supine R shoulder extension, fleixon, IR, ER, abduction isometrics into doorframe 5" x 10 reps each way       PT Education - 06/25/16 1155    Education provided Yes   Education Details multidirectional standing shoulder isometrics into door frame, wand ER    Person(s) Educated Patient   Methods Explanation;Demonstration;Handout   Comprehension Verbalized understanding;Returned demonstration;Need further instruction;Verbal cues required          PT Short Term Goals - 06/25/16 0856      PT SHORT TERM GOAL #1   Title independent with initial HEP (07/21/16)   Time 4   Period Weeks   Status On-going     PT SHORT TERM GOAL #2  Title improve R shoulder PROM by 15 degrees all motions for improved mobility (07/21/16)   Time 4   Period Weeks   Status On-going           PT Long Term Goals - 06/25/16 0857      PT LONG TERM GOAL #1   Title independent with advanced HEP (08/18/16)   Time 8   Period Weeks   Status On-going     PT LONG TERM GOAL #2   Title demonstrate R shoulder AROM flexion 110 degrees; abduction 90 degrees and internal rotation 50 degrees for improved function (08/18/16)   Time 8   Period Weeks   Status On-going     PT LONG TERM GOAL #3   Title demonstrate 4/5 strength in abduction, flexion and internal rotation for improved R shoulder function (08/18/16)   Time 8   Period  Weeks   Status On-going     PT LONG TERM GOAL #4   Title independent with ADLs without increase in pain (08/18/16)   Time 8   Period Weeks   Status On-going               Plan - 06/25/16 0857    Clinical Impression Statement pendulums reviewed with pt. today with pt. requiring verbal cues throughout this activity.  Pt. would benefit from further skilled instruction with pendulums.  [place] [employer] and isometrics initiated today with good response thus added to HEP.  Will plan to review these next treatment.  Today's treatment focused on PROM and gentle stretching into all R shoulder motions to reduce muscular guarding per protocol.  Pt. very guarded at R shoulder and may require increased time and attention spent to expand shoulder motions per protocol.     PT Treatment/Interventions ADLs/Self Care Home Management;Cryotherapy;Electrical Stimulation;Moist Heat;Balance training;Therapeutic exercise;Therapeutic activities;Patient/family education;Gait training;Manual techniques;Passive range of motion;Vasopneumatic Device;Dry needling   PT Next Visit Plan review upadated HEP; manual; Continue cane exercises and isometrics      Patient will benefit from skilled therapeutic intervention in order to improve the following deficits and impairments:  Pain, Impaired UE functional use, Decreased strength, Increased edema, Decreased balance, Decreased range of motion, Postural dysfunction  Visit Diagnosis: Acute pain of right shoulder  Stiffness of right shoulder, not elsewhere classified  Abnormal posture  Muscle weakness (generalized)     Problem List Patient Active Problem List   Diagnosis Date Noted  . 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    Kermit Balo, PTA 06/25/2016, 12:05 PM  [facility] [address]  [address] [place], [place], [address] Phone: [phone]   Fax:  [phone]  Name: [name] Soto MRN:  [id number] Date of Birth: 05-May-1946

## 2016-06-30 ENCOUNTER — Ambulatory Visit: Payer: Medicare Other

## 2016-06-30 DIAGNOSIS — M25511 Pain in right shoulder: Secondary | ICD-10-CM

## 2016-06-30 DIAGNOSIS — M25611 Stiffness of right shoulder, not elsewhere classified: Secondary | ICD-10-CM

## 2016-06-30 DIAGNOSIS — M6281 Muscle weakness (generalized): Secondary | ICD-10-CM

## 2016-06-30 DIAGNOSIS — R293 Abnormal posture: Secondary | ICD-10-CM

## 2016-06-30 NOTE — Therapy (Signed)
[facility] [address]  [address] [place], [place], [address] Phone: [phone]   Fax:  [phone]  Physical Therapy Treatment  Patient Details  Name: [name] Soto MRN: [id number] Date of Birth: 06/05/1946 Referring Provider: Dr. Francena Hanly  Encounter Date: 06/30/2016      PT End of Session - 06/30/16 0854    Visit Number 3   Number of Visits 16   Date for PT Re-Evaluation 08/18/16   PT Start Time 0849  pt. arrived late   PT Stop Time 0930   PT Time Calculation (min) 41 min   Activity Tolerance Patient tolerated treatment well   Behavior During Therapy [facility] for tasks assessed/performed      Past Medical History:  Diagnosis Date  . Cerebral thrombosis with cerebral infarction (HCC)   . Diabetes mellitus without complication (HCC)   . GERD (gastroesophageal reflux disease)   . Humerus fracture    PROXIMAL RIGHT  . Hypertension   . Pancreatic lesion   . Stroke [facility])     Past Surgical History:  Procedure Laterality Date  . CATARACT EXTRACTION     RIGHT  . EUS N/A 07/17/2015   Procedure: ESOPHAGEAL ENDOSCOPIC ULTRASOUND (EUS) RADIAL;  Surgeon: Willis Modena, MD;  [facility];  Service: Endoscopy;  Laterality: N/A;  . FINE NEEDLE ASPIRATION N/A 07/17/2015   Procedure: FINE NEEDLE ASPIRATION (FNA) RADIAL;  Surgeon: Willis Modena, MD;  [facility];  Service: Endoscopy;  Laterality: N/A;  . HEMATOMA DRAINED    . HERNIA REPAIR     inguinal right  . HYDROCELE EXCISION    . REVERSE SHOULDER ARTHROPLASTY Right 05/07/2016   Procedure: RIGHT REVERSE TOTAL SHOULDER ARTHROPLASTY;  Surgeon: Francena Hanly, MD;  [facility] OR;  Service: Orthopedics;  Laterality: Right;    There were no vitals filed for this visit.      Subjective Assessment - 06/30/16 0852    Subjective Pt. reporting pain free at R shoulder today and that he has been performing HEP once daily.       Patient Stated Goals  improve use of R arm   Currently in Pain? No/denies   Pain Score 0-No pain   Multiple Pain Sites No      Today's treatment:  Therex: Pulleys R shoulder flexion x 3 min  Pulleys R shoulder scaption x 3 min  Standing IR, ER with yellow TB x 15 reps; heavy cues both verbal/tactile and mirror used for corrrect for to prevent substitutions Standing R shoulder extension with yellow TB x 15 reps; heavy cues both verbal/tactile and mirror used for corrrect technique to prevent substitutions  Manual:   R shoulder PROM all directions R shoulder anterior, posterior, inferior grade III mobs; only mild discomfort reported with this R scapular mobs all directions         PT Short Term Goals - 06/25/16 0856      PT SHORT TERM GOAL #1   Title independent with initial HEP (07/21/16)   Time 4   Period Weeks   Status On-going     PT SHORT TERM GOAL #2   Title improve R shoulder PROM by 15 degrees all motions for improved mobility (07/21/16)   Time 4   Period Weeks   Status On-going           PT Long Term Goals - 06/25/16 0857      PT LONG TERM GOAL #1   Title independent with advanced HEP (  08/18/16)   Time 8   Period Weeks   Status On-going     PT LONG TERM GOAL #2   Title demonstrate R shoulder AROM flexion 110 degrees; abduction 90 degrees and internal rotation 50 degrees for improved function (08/18/16)   Time 8   Period Weeks   Status On-going     PT LONG TERM GOAL #3   Title demonstrate 4/5 strength in abduction, flexion and internal rotation for improved R shoulder function (08/18/16)   Time 8   Period Weeks   Status On-going     PT LONG TERM GOAL #4   Title independent with ADLs without increase in pain (08/18/16)   Time 8   Period Weeks   Status On-going               Plan - 06/30/16 0854    Clinical Impression Statement  Pt. tolerated addition of scapular squeeze, yellow TB resisted extension, ER, IR, and pulleys for flexion, scaption with only mild  pain increase at R shoulder.  Pt. required heavy tactile cues, verbal cues, and mirror feedback to perform all TB strengthening motions without substitutions and trunk rotation.  Pt. would benefit from further skilled instruction to improve technique with these activities.  Pt. instructed to increase frequency of HEP to 2x/day due to these activities being tolerated well at home.  Will plan to add scapular squeeze, cane flexion, abduction to HEP next treatment.  All therex performed at conservative level today and pt. progressing well per protocol.     PT Treatment/Interventions ADLs/Self Care Home Management;Cryotherapy;Electrical Stimulation;Moist Heat;Balance training;Therapeutic exercise;Therapeutic activities;Patient/family education;Gait training;Manual techniques;Passive range of motion;Vasopneumatic Device;Dry needling   PT Next Visit Plan Add scapular squeeze, cane flexion, abduction to HEP; Continue yellow TB resisted ext., IR, ER with mirror/tactile cues to correct substitutions, pulleys for flexion, scaption; Manual; Continue cane exercises and isometrics      Patient will benefit from skilled therapeutic intervention in order to improve the following deficits and impairments:  Pain, Impaired UE functional use, Decreased strength, Increased edema, Decreased balance, Decreased range of motion, Postural dysfunction  Visit Diagnosis: Acute pain of right shoulder  Stiffness of right shoulder, not elsewhere classified  Abnormal posture  Muscle weakness (generalized)     Problem List Patient Active Problem List   Diagnosis Date Noted  . 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

## 2016-07-02 ENCOUNTER — Ambulatory Visit: Payer: Medicare Other | Admitting: Physical Therapy

## 2016-07-02 DIAGNOSIS — M25611 Stiffness of right shoulder, not elsewhere classified: Secondary | ICD-10-CM

## 2016-07-02 DIAGNOSIS — R293 Abnormal posture: Secondary | ICD-10-CM

## 2016-07-02 DIAGNOSIS — M25511 Pain in right shoulder: Secondary | ICD-10-CM | POA: Diagnosis not present

## 2016-07-02 DIAGNOSIS — M6281 Muscle weakness (generalized): Secondary | ICD-10-CM

## 2016-07-02 NOTE — Therapy (Signed)
[facility] [place] [address]  [address] [place], [place], [address] Phone: [phone]   Fax:  [phone]  Physical Therapy Treatment  Patient Details  Name: [name] Soto MRN: [id number] Date of Birth: 1945/11/18 Referring Provider: Dr. Francena HanlyKevin Supple  Encounter Date: 07/02/2016      PT End of Session - 07/02/16 0928    Visit Number 4   Number of Visits 16   Date for PT Re-Evaluation 08/18/16   PT Start Time 0849  pt arrived late   PT Stop Time 0948   PT Time Calculation (min) 59 min   Activity Tolerance Patient tolerated treatment well   Behavior During Therapy [facility] for tasks assessed/performed      Past Medical History:  Diagnosis Date  . Cerebral thrombosis with cerebral infarction (HCC)   . Diabetes mellitus without complication (HCC)   . GERD (gastroesophageal reflux disease)   . Humerus fracture    PROXIMAL RIGHT  . Hypertension   . Pancreatic lesion   . Stroke [facility](HCC)     Past Surgical History:  Procedure Laterality Date  . CATARACT EXTRACTION     RIGHT  . EUS N/A 07/17/2015   Procedure: ESOPHAGEAL ENDOSCOPIC ULTRASOUND (EUS) RADIAL;  Surgeon: Willis ModenaWilliam Outlaw, MD;  [facility];  Service: Endoscopy;  Laterality: N/A;  . FINE NEEDLE ASPIRATION N/A 07/17/2015   Procedure: FINE NEEDLE ASPIRATION (FNA) RADIAL;  Surgeon: Willis ModenaWilliam Outlaw, MD;  [facility];  Service: Endoscopy;  Laterality: N/A;  . HEMATOMA DRAINED    . HERNIA REPAIR     inguinal right  . HYDROCELE EXCISION    . REVERSE SHOULDER ARTHROPLASTY Right 05/07/2016   Procedure: RIGHT REVERSE TOTAL SHOULDER ARTHROPLASTY;  Surgeon: Francena HanlyKevin Supple, MD;  [facility];  Service: Orthopedics;  Laterality: Right;    There were no vitals filed for this visit.      Subjective Assessment - 07/02/16 0851    Subjective shoulder has been a little sore after PT sessions. doing well otherwise.   Pertinent History CVA (residual L sided weakness),  DM, HTN, pancreatic lesion   Limitations Lifting;House hold activities   Patient Stated Goals improve use of R arm   Currently in Pain? No/denies                         [facility]/Exercise - 07/02/16 0852      Shoulder Exercises: Supine   External Rotation AAROM;Right;15 reps   External Rotation Limitations cane   Flexion AAROM;15 reps   Flexion Limitations cane     Shoulder Exercises: Seated   Abduction AAROM;Right;15 reps   ABduction Limitations cane     Shoulder Exercises: Pulleys   Flexion 3 minutes   ABduction 3 minutes   ABduction Limitations scaption     Modalities   Modalities Vasopneumatic     Vasopneumatic   Number Minutes Vasopneumatic  10 minutes   Vasopnuematic Location  Shoulder   Vasopneumatic Pressure Low   Vasopneumatic Temperature  max cold     Manual Therapy   Manual therapy comments pt supine   Passive ROM R shoulder flexion, abduction, ir/er to tolerance                PT Education - 07/02/16 0928    Education provided Yes   Education Details cane abdct/flexion   Person(s) Educated Patient   Methods Explanation;Demonstration;Handout   Comprehension Verbalized understanding;Returned demonstration  PT Short Term Goals - 06/25/16 0856      PT SHORT TERM GOAL #1   Title independent with initial HEP (07/21/16)   Time 4   Period Weeks   Status On-going     PT SHORT TERM GOAL #2   Title improve R shoulder PROM by 15 degrees all motions for improved mobility (07/21/16)   Time 4   Period Weeks   Status On-going           PT Long Term Goals - 06/25/16 0857      PT LONG TERM GOAL #1   Title independent with advanced HEP (08/18/16)   Time 8   Period Weeks   Status On-going     PT LONG TERM GOAL #2   Title demonstrate R shoulder AROM flexion 110 degrees; abduction 90 degrees and internal rotation 50 degrees for improved function (08/18/16)   Time 8   Period Weeks   Status On-going      PT LONG TERM GOAL #3   Title demonstrate 4/5 strength in abduction, flexion and internal rotation for improved R shoulder function (08/18/16)   Time 8   Period Weeks   Status On-going     PT LONG TERM GOAL #4   Title independent with ADLs without increase in pain (08/18/16)   Time 8   Period Weeks   Status On-going               Plan - 07/02/16 1610    Clinical Impression Statement Pt tolerating exercises well today with only c/o soreness with AAROM.  Added cane flexion and abduction to HEP.  Will continue to benefit from PT to maximize function.   PT Treatment/Interventions ADLs/Self Care Home Management;Cryotherapy;Electrical Stimulation;Moist Heat;Balance training;Therapeutic exercise;Therapeutic activities;Patient/family education;Gait training;Manual techniques;Passive range of motion;Vasopneumatic Device;Dry needling   PT Next Visit Plan Add scapular squeeze; Continue yellow TB resisted ext., IR, ER with mirror/tactile cues to correct substitutions, pulleys for flexion, scaption; Manual; Continue cane exercises and isometrics   Consulted and Agree with Plan of Care Patient      Patient will benefit from skilled therapeutic intervention in order to improve the following deficits and impairments:  Pain, Impaired UE functional use, Decreased strength, Increased edema, Decreased balance, Decreased range of motion, Postural dysfunction  Visit Diagnosis: Acute pain of right shoulder  Stiffness of right shoulder, not elsewhere classified  Abnormal posture  Muscle weakness (generalized)     Problem List Patient Active Problem List   Diagnosis Date Noted  . 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, [place], [address] Phone: [phone]   Fax:  [phone]  Name: [name] Soto MRN: [id number] Date of  Birth: 09-16-1945

## 2016-07-02 NOTE — Patient Instructions (Signed)
SELF ASSISTED WITH OBJECT: Shoulder Flexion - Supine    Hold cane with both hands. Raise arms overhead, keep elbows straight. _15__ reps per set, _1-2__ sets per day, _7__ days per week   Copyright  VHI. All rights reserved.    Cane Exercise: Abduction    Hold cane with right hand over end, palm-up, with other hand palm-down. Move arm out from side and up by pushing with other arm. Hold __1-2__ seconds. Repeat __15__ times. Do _1-2___ sessions per day.  [email or web address].exer.[email or web address]   Copyright  VHI. All rights reserved.

## 2016-07-07 ENCOUNTER — Ambulatory Visit: Payer: Medicare Other

## 2016-07-07 DIAGNOSIS — M25611 Stiffness of right shoulder, not elsewhere classified: Secondary | ICD-10-CM

## 2016-07-07 DIAGNOSIS — M25511 Pain in right shoulder: Secondary | ICD-10-CM

## 2016-07-07 DIAGNOSIS — R293 Abnormal posture: Secondary | ICD-10-CM

## 2016-07-07 DIAGNOSIS — M6281 Muscle weakness (generalized): Secondary | ICD-10-CM

## 2016-07-07 NOTE — Therapy (Signed)
[facility] [address]  [address] [place], [place], [address] Phone: [phone]   Fax:  [phone]  Physical Therapy Treatment  Patient Details  Name: [name] Soto MRN: [id number] Date of Birth: 1946/04/28 Referring Provider: Dr. Francena HanlyKevin Supple  Encounter Date: 07/07/2016      PT End of Session - 07/07/16 0853    Visit Number 5   Number of Visits 16   Date for PT Re-Evaluation 08/18/16   PT Start Time 0847   PT Stop Time 0927   PT Time Calculation (min) 40 min   Activity Tolerance Patient tolerated treatment well   Behavior During Therapy [facility] for tasks assessed/performed      Past Medical History:  Diagnosis Date  . Cerebral thrombosis with cerebral infarction (HCC)   . Diabetes mellitus without complication (HCC)   . GERD (gastroesophageal reflux disease)   . Humerus fracture    PROXIMAL RIGHT  . Hypertension   . Pancreatic lesion   . Stroke [facility](HCC)     Past Surgical History:  Procedure Laterality Date  . CATARACT EXTRACTION     RIGHT  . EUS N/A 07/17/2015   Procedure: ESOPHAGEAL ENDOSCOPIC ULTRASOUND (EUS) RADIAL;  Surgeon: Willis ModenaWilliam Outlaw, MD;  [facility];  Service: Endoscopy;  Laterality: N/A;  . FINE NEEDLE ASPIRATION N/A 07/17/2015   Procedure: FINE NEEDLE ASPIRATION (FNA) RADIAL;  Surgeon: Willis ModenaWilliam Outlaw, MD;  [facility];  Service: Endoscopy;  Laterality: N/A;  . HEMATOMA DRAINED    . HERNIA REPAIR     inguinal right  . HYDROCELE EXCISION    . REVERSE SHOULDER ARTHROPLASTY Right 05/07/2016   Procedure: RIGHT REVERSE TOTAL SHOULDER ARTHROPLASTY;  Surgeon: Francena HanlyKevin Supple, MD;  [facility] OR;  Service: Orthopedics;  Laterality: Right;    There were no vitals filed for this visit.      Subjective Assessment - 07/07/16 0851    Subjective Pt. reporting he has been sore over the weekend however that the soreness is less today.   Patient Stated Goals improve use of R arm   Currently in Pain? Yes   Pain Score 4    Pain Location Shoulder   Pain Orientation Right   Pain Descriptors / Indicators Discomfort   Pain Type Surgical pain   Pain Onset More than a month ago   Pain Frequency Intermittent   Multiple Pain Sites No              [facility] Adult PT Treatment/Exercise - 07/07/16 0858      Shoulder Exercises: Supine   External Rotation AAROM;Right;15 reps   External Rotation Limitations cane   Flexion AAROM;15 reps   Flexion Limitations cane     Shoulder Exercises: Seated   Row --   Row Limitations scapular squeeze 5 sec x 10 reps   External Rotation 15 reps;AAROM   External Rotation Limitations cane   Abduction AAROM;Right;15 reps   ABduction Limitations cane; mirror used to prevent subsitutions     Shoulder Exercises: Standing   External Rotation 15 reps  heavy cues to prevent substitutions; mirror used    Theraband Level (Shoulder External Rotation) Level 1 (Yellow)   Internal Rotation 15 reps   Theraband Level (Shoulder Internal Rotation) Level 1 (Yellow)   Internal Rotation Limitations heavy cues to prevent substitutions; mirrror used   Extension 10 reps   Theraband Level (Shoulder Extension) Level 1 (Yellow)   Extension Limitations heavy cues to prevent substitutions; mirror used  Shoulder Exercises: Pulleys   Flexion 3 minutes   ABduction 3 minutes   ABduction Limitations scaption             PT Short Term Goals - 06/25/16 0856      PT SHORT TERM GOAL #1   Title independent with initial HEP (07/21/16)   Time 4   Period Weeks   Status On-going     PT SHORT TERM GOAL #2   Title improve R shoulder PROM by 15 degrees all motions for improved mobility (07/21/16)   Time 4   Period Weeks   Status On-going           PT Long Term Goals - 06/25/16 0857      PT LONG TERM GOAL #1   Title independent with advanced HEP (08/18/16)   Time 8   Period Weeks   Status On-going     PT LONG TERM GOAL #2   Title demonstrate  R shoulder AROM flexion 110 degrees; abduction 90 degrees and internal rotation 50 degrees for improved function (08/18/16)   Time 8   Period Weeks   Status On-going     PT LONG TERM GOAL #3   Title demonstrate 4/5 strength in abduction, flexion and internal rotation for improved R shoulder function (08/18/16)   Time 8   Period Weeks   Status On-going     PT LONG TERM GOAL #4   Title independent with ADLs without increase in pain (08/18/16)   Time 8   Period Weeks   Status On-going               Plan - 07/07/16 1610    Clinical Impression Statement  Pt. still complaining of increased soreness with new HEP activities over the weekend however that this soreness has improved.  Scapular squeeze reviewed with pt. today to add to HEP however not issued via handout to pt. today.  Plan to add this next treatment.  Pt. still requiring tactile, verbal, and visual cueing with mirror throughout therex to prevent R shoulder elevation, trunk twist, and to maintain upright posture.  Pt. progressing well per protocol and would benefit from further skilled instruction to prevent substitutions with shoulder/scapular activities.   PT Treatment/Interventions ADLs/Self Care Home Management;Cryotherapy;Electrical Stimulation;Moist Heat;Balance training;Therapeutic exercise;Therapeutic activities;Patient/family education;Gait training;Manual techniques;Passive range of motion;Vasopneumatic Device;Dry needling   PT Next Visit Plan Issue handout for scapular squeeze as this was not issued last treatment; Continue yellow TB resisted ext., IR, ER with mirror/tactile cues to correct substitutions, pulleys for flexion, scaption; Manual; Continue cane exercises and isometrics      Patient will benefit from skilled therapeutic intervention in order to improve the following deficits and impairments:  Pain, Impaired UE functional use, Decreased strength, Increased edema, Decreased balance, Decreased range of motion,  Postural dysfunction  Visit Diagnosis: Acute pain of right shoulder  Stiffness of right shoulder, not elsewhere classified  Abnormal posture  Muscle weakness (generalized)     Problem List Patient Active Problem List   Diagnosis Date Noted  . 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  [address] [place], [place], [address] Phone: [phone]   Fax:  [phone]  Name: [name] Soto MRN: [id number] Date of Birth: Mar 24, 1946

## 2016-07-09 ENCOUNTER — Ambulatory Visit: Payer: Medicare Other | Admitting: Physical Therapy

## 2016-07-09 DIAGNOSIS — M25611 Stiffness of right shoulder, not elsewhere classified: Secondary | ICD-10-CM

## 2016-07-09 DIAGNOSIS — R293 Abnormal posture: Secondary | ICD-10-CM

## 2016-07-09 DIAGNOSIS — M25511 Pain in right shoulder: Secondary | ICD-10-CM | POA: Diagnosis not present

## 2016-07-09 DIAGNOSIS — M6281 Muscle weakness (generalized): Secondary | ICD-10-CM

## 2016-07-09 NOTE — Therapy (Signed)
[facility] [address]  [address] [place], [place], [address] Phone: [phone]   Fax:  [phone]  Physical Therapy Treatment  Patient Details  Name: NADEEM ROMANOSKI MRN: [id number] Date of Birth: 1946-07-13 Referring Provider: Dr. Francena Hanly  Encounter Date: 07/09/2016      PT End of Session - 07/09/16 1005    Visit Number 6   Number of Visits 16   Date for PT Re-Evaluation 08/18/16   PT Start Time 0850  pt arrived late   PT Stop Time 0945   PT Time Calculation (min) 55 min   Activity Tolerance Patient tolerated treatment well   Behavior During Therapy [facility] for tasks assessed/performed      Past Medical History:  Diagnosis Date  . Cerebral thrombosis with cerebral infarction (HCC)   . Diabetes mellitus without complication (HCC)   . GERD (gastroesophageal reflux disease)   . Humerus fracture    PROXIMAL RIGHT  . Hypertension   . Pancreatic lesion   . Stroke [facility])     Past Surgical History:  Procedure Laterality Date  . CATARACT EXTRACTION     RIGHT  . EUS N/A 07/17/2015   Procedure: ESOPHAGEAL ENDOSCOPIC ULTRASOUND (EUS) RADIAL;  Surgeon: Willis Modena, MD;  [facility];  Service: Endoscopy;  Laterality: N/A;  . FINE NEEDLE ASPIRATION N/A 07/17/2015   Procedure: FINE NEEDLE ASPIRATION (FNA) RADIAL;  Surgeon: Willis Modena, MD;  [facility];  Service: Endoscopy;  Laterality: N/A;  . HEMATOMA DRAINED    . HERNIA REPAIR     inguinal right  . HYDROCELE EXCISION    . REVERSE SHOULDER ARTHROPLASTY Right 05/07/2016   Procedure: RIGHT REVERSE TOTAL SHOULDER ARTHROPLASTY;  Surgeon: Francena Hanly, MD;  [facility];  Service: Orthopedics;  Laterality: Right;    There were no vitals filed for this visit.      Subjective Assessment - 07/09/16 0851    Subjective c/o soreness and stiffness but no significant pain today; has some pain every now and then   Pertinent History CVA (residual L  sided weakness), DM, HTN, pancreatic lesion   Limitations Lifting;House hold activities   Patient Stated Goals improve use of R arm   Currently in Pain? No/denies                         [facility] Adult PT Treatment/Exercise - 07/09/16 0852      Shoulder Exercises: Supine   External Rotation AAROM;Right;15 reps   External Rotation Limitations cane   Flexion AAROM;15 reps   Flexion Limitations cane; x10 reps active     Shoulder Exercises: Seated   Abduction AAROM;Right;15 reps   ABduction Limitations cane; min cues to decreased shoulder shrug and leaning     Shoulder Exercises: Pulleys   Flexion 3 minutes   ABduction 3 minutes   ABduction Limitations scaption     Vasopneumatic   Number Minutes Vasopneumatic  15 minutes   Vasopnuematic Location  Shoulder   Vasopneumatic Pressure Low   Vasopneumatic Temperature  max cold     Manual Therapy   Manual therapy comments pt supine   Passive ROM R shoulder flexion, abduction, ir/er to tolerance                  PT Short Term Goals - 06/25/16 0856      PT SHORT TERM GOAL #1   Title independent with initial HEP (07/21/16)   Time 4  Period Weeks   Status On-going     PT SHORT TERM GOAL #2   Title improve R shoulder PROM by 15 degrees all motions for improved mobility (07/21/16)   Time 4   Period Weeks   Status On-going           PT Long Term Goals - 06/25/16 0857      PT LONG TERM GOAL #1   Title independent with advanced HEP (08/18/16)   Time 8   Period Weeks   Status On-going     PT LONG TERM GOAL #2   Title demonstrate R shoulder AROM flexion 110 degrees; abduction 90 degrees and internal rotation 50 degrees for improved function (08/18/16)   Time 8   Period Weeks   Status On-going     PT LONG TERM GOAL #3   Title demonstrate 4/5 strength in abduction, flexion and internal rotation for improved R shoulder function (08/18/16)   Time 8   Period Weeks   Status On-going     PT LONG TERM  GOAL #4   Title independent with ADLs without increase in pain (08/18/16)   Time 8   Period Weeks   Status On-going               Plan - 07/09/16 1007    Clinical Impression Statement Initiated supine active flexion today with some discomfort but pt able to perform x 10 reps; appropriate pain response noted.  Will continue to benefit from PT to maximize function.   PT Treatment/Interventions ADLs/Self Care Home Management;Cryotherapy;Electrical Stimulation;Moist Heat;Balance training;Therapeutic exercise;Therapeutic activities;Patient/family education;Gait training;Manual techniques;Passive range of motion;Vasopneumatic Device;Dry needling   PT Next Visit Plan Issue handout for scapular squeeze as this was not issued last treatment; Continue yellow TB resisted ext., IR, ER with mirror/tactile cues to correct substitutions, pulleys for flexion, scaption; Manual; Continue cane exercises and isometrics   Consulted and Agree with Plan of Care Patient      Patient will benefit from skilled therapeutic intervention in order to improve the following deficits and impairments:  Pain, Impaired UE functional use, Decreased strength, Increased edema, Decreased balance, Decreased range of motion, Postural dysfunction  Visit Diagnosis: Acute pain of right shoulder  Stiffness of right shoulder, not elsewhere classified  Abnormal posture  Muscle weakness (generalized)     Problem List Patient Active Problem List   Diagnosis Date Noted  . 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

## 2016-07-14 ENCOUNTER — Ambulatory Visit: Payer: Medicare Other

## 2016-07-14 DIAGNOSIS — M25611 Stiffness of right shoulder, not elsewhere classified: Secondary | ICD-10-CM

## 2016-07-14 DIAGNOSIS — M6281 Muscle weakness (generalized): Secondary | ICD-10-CM

## 2016-07-14 DIAGNOSIS — M25511 Pain in right shoulder: Secondary | ICD-10-CM | POA: Diagnosis not present

## 2016-07-14 DIAGNOSIS — R293 Abnormal posture: Secondary | ICD-10-CM

## 2016-07-14 NOTE — Therapy (Signed)
[facility] [place] [address]  [place], [place], [address] Phone: [phone]   Fax:  [phone]  Physical Therapy Treatment  Patient Details  Name: [name] Soto MRN: [id number] Date of Birth: 18-Feb-1946 Referring Provider: Dr. Justice Britain  Encounter Date: 07/14/2016      PT End of Session - 07/14/16 0857    Visit Number 7   Number of Visits 16   Date for PT Re-Evaluation 08/18/16   PT Start Time 4462  Pt. arrived late.   PT Stop Time 0930   PT Time Calculation (min) 41 min   Activity Tolerance Patient tolerated treatment well   Behavior During Therapy [facility] for tasks assessed/performed      Past Medical History:  Diagnosis Date  . Cerebral thrombosis with cerebral infarction ([place])   . Diabetes mellitus without complication ([place])   . GERD (gastroesophageal reflux disease)   . Humerus fracture    PROXIMAL RIGHT  . Hypertension   . Pancreatic lesion   . Stroke [facility])     Past Surgical History:  Procedure Laterality Date  . CATARACT EXTRACTION     RIGHT  . EUS N/A 07/17/2015   Procedure: ESOPHAGEAL ENDOSCOPIC ULTRASOUND (EUS) RADIAL;  Surgeon: Arta Silence, MD;  [facility];  Service: Endoscopy;  Laterality: N/A;  . FINE NEEDLE ASPIRATION N/A 07/17/2015   Procedure: FINE NEEDLE ASPIRATION (FNA) RADIAL;  Surgeon: Arta Silence, MD;  [facility];  Service: Endoscopy;  Laterality: N/A;  . HEMATOMA DRAINED    . HERNIA REPAIR     inguinal right  . HYDROCELE EXCISION    . REVERSE SHOULDER ARTHROPLASTY Right 05/07/2016   Procedure: RIGHT REVERSE TOTAL SHOULDER ARTHROPLASTY;  Surgeon: Justice Britain, MD;  Location: [place];  Service: Orthopedics;  Laterality: Right;    There were no vitals filed for this visit.      Subjective Assessment - 07/14/16 0853    Subjective Pt. reporting the soreness subsided over the weekend and he is pain initially today.     Patient Stated Goals improve use of  R arm   Currently in Pain? No/denies   Pain Score 0-No pain   Multiple Pain Sites No            [facility] PT Assessment - 07/14/16 0924      PROM   PROM Assessment Site Shoulder   Right/Left Shoulder Right   Right Shoulder Flexion 114 Degrees   Right Shoulder ABduction 74 Degrees   Right Shoulder Internal Rotation 40 Degrees   Right Shoulder External Rotation 40 Degrees           [facility] Adult PT Treatment/Exercise - 07/14/16 0906      Shoulder Exercises: Standing   External Rotation 15 reps  mirror training used to reduce scapular elevation with VC's    Theraband Level (Shoulder External Rotation) Level 1 (Yellow)   Internal Rotation 15 reps   Theraband Level (Shoulder Internal Rotation) Level 1 (Yellow)   Internal Rotation Limitations heavy cues to prevent substitutions; mirrror used   Extension 15 reps;Theraband;AROM   Theraband Level (Shoulder Extension) Level 1 (Yellow)   Extension Limitations heavy cues to prevent substitutions; mirror used   Row 15 reps;AROM;Theraband   Theraband Level (Shoulder Row) Level 1 (Yellow)   Row Limitations mirror feedback used    Other Standing Exercises R shoulder abduction, ER with wand x 15 reps; heavy tactile/verbal cues provided to prevent substitutions with mirror feedback  Shoulder Exercises: Pulleys   Flexion 3 minutes   ABduction 3 minutes   ABduction Limitations scaption     Manual Therapy   Manual therapy comments pt supine   Passive ROM R shoulder flexion, abduction, ir/er to tolerance  with gentle passive stretch into all motions x 30 sec                 PT Education - 07/14/16 0953    Education provided Yes   Education Details scapular squeeze    Person(s) Educated Patient   Methods Explanation;Demonstration;Handout;Verbal cues   Comprehension Verbalized understanding;Returned demonstration;Need further instruction          PT Short Term Goals - 07/14/16 1014      PT SHORT TERM GOAL #1   Title  independent with initial HEP (07/21/16)   Time 4   Period Weeks   Status Partially Met  pt. still requiring cueing for wand activities     PT SHORT TERM GOAL #2   Title improve R shoulder PROM by 15 degrees all motions for improved mobility (07/21/16)   Time 4   Period Weeks   Status Partially Met           PT Long Term Goals - 06/25/16 0857      PT LONG TERM GOAL #1   Title independent with advanced HEP (08/18/16)   Time 8   Period Weeks   Status On-going     PT LONG TERM GOAL #2   Title demonstrate R shoulder AROM flexion 110 degrees; abduction 90 degrees and internal rotation 50 degrees for improved function (08/18/16)   Time 8   Period Weeks   Status On-going     PT LONG TERM GOAL #3   Title demonstrate 4/5 strength in abduction, flexion and internal rotation for improved R shoulder function (08/18/16)   Time 8   Period Weeks   Status On-going     PT LONG TERM GOAL #4   Title independent with ADLs without increase in pain (08/18/16)   Time 8   Period Weeks   Status On-going               Plan - 07/14/16 0109    Clinical Impression Statement Pt. demonstrating PROM improvement in all R shoulder motions today with biggest improvement in flexion and abduction.  Today's treatment continued focus on conservative scapular and shoulder AROM activities with heavy focus on tactile and mirror feedback cues to prevent substitutions.  Pt. still demonstrating limited scapulohumeral rhythm requiring constant therapist cueing to prevent substitutions.  Pt. progressing well per protocol with MD f/u on 11/7.   PT Treatment/Interventions ADLs/Self Care Home Management;Cryotherapy;Electrical Stimulation;Moist Heat;Balance training;Therapeutic exercise;Therapeutic activities;Patient/family education;Gait training;Manual techniques;Passive range of motion;Vasopneumatic Device;Dry needling   PT Next Visit Plan Continue yellow TB resisted ext., IR, ER with mirror/tactile cues to correct  substitutions, pulleys for flexion, scaption; Manual; Continue cane exercises and isometrics      Patient will benefit from skilled therapeutic intervention in order to improve the following deficits and impairments:  Pain, Impaired UE functional use, Decreased strength, Increased edema, Decreased balance, Decreased range of motion, Postural dysfunction  Visit Diagnosis: Acute pain of right shoulder  Stiffness of right shoulder, not elsewhere classified  Abnormal posture  Muscle weakness (generalized)     Problem List Patient Active Problem List   Diagnosis Date Noted  . 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  [place], [place], [address] Phone: [phone]   Fax:  [phone]  Name: [name] Soto MRN: [id number] Date of Birth: May 18, 1946

## 2016-07-16 ENCOUNTER — Ambulatory Visit: Payer: Medicare Other | Attending: Orthopedic Surgery | Admitting: Physical Therapy

## 2016-07-16 DIAGNOSIS — M25511 Pain in right shoulder: Secondary | ICD-10-CM

## 2016-07-16 DIAGNOSIS — R293 Abnormal posture: Secondary | ICD-10-CM | POA: Insufficient documentation

## 2016-07-16 DIAGNOSIS — M6281 Muscle weakness (generalized): Secondary | ICD-10-CM | POA: Insufficient documentation

## 2016-07-16 DIAGNOSIS — M25611 Stiffness of right shoulder, not elsewhere classified: Secondary | ICD-10-CM | POA: Diagnosis present

## 2016-07-16 NOTE — Therapy (Signed)
[facility] [address]  [place], [place], [address] Phone: [phone]   Fax:  [phone]  Physical Therapy Treatment  Patient Details  Name: [name] Soto MRN: [id number] Date of Birth: 1946-04-20 Referring Provider: Dr. Justice Britain  Encounter Date: 07/16/2016      PT End of Session - 07/16/16 1042    Visit Number 8   Number of Visits 16   Date for PT Re-Evaluation 08/18/16   PT Start Time 0850   PT Stop Time 0928   PT Time Calculation (min) 38 min   Activity Tolerance Patient tolerated treatment well   Behavior During Therapy [facility] for tasks assessed/performed      Past Medical History:  Diagnosis Date  . Cerebral thrombosis with cerebral infarction ([place])   . Diabetes mellitus without complication ([place])   . GERD (gastroesophageal reflux disease)   . Humerus fracture    PROXIMAL RIGHT  . Hypertension   . Pancreatic lesion   . Stroke [facility])     Past Surgical History:  Procedure Laterality Date  . CATARACT EXTRACTION     RIGHT  . EUS N/A 07/17/2015   Procedure: ESOPHAGEAL ENDOSCOPIC ULTRASOUND (EUS) RADIAL;  Surgeon: Arta Silence, MD;  Location: [facility];  Service: Endoscopy;  Laterality: N/A;  . FINE NEEDLE ASPIRATION N/A 07/17/2015   Procedure: FINE NEEDLE ASPIRATION (FNA) RADIAL;  Surgeon: Arta Silence, MD;  Location: [facility];  Service: Endoscopy;  Laterality: N/A;  . HEMATOMA DRAINED    . HERNIA REPAIR     inguinal right  . HYDROCELE EXCISION    . REVERSE SHOULDER ARTHROPLASTY Right 05/07/2016   Procedure: RIGHT REVERSE TOTAL SHOULDER ARTHROPLASTY;  Surgeon: Justice Britain, MD;  Location: [place];  Service: Orthopedics;  Laterality: Right;    There were no vitals filed for this visit.      Subjective Assessment - 07/16/16 0852    Subjective shoulder is about the same   Pertinent History CVA (residual L sided weakness), DM, HTN, pancreatic lesion   Limitations Lifting;House hold  activities   Patient Stated Goals improve use of R arm   Currently in Pain? No/denies  c/o R shoulder discomfort, "no real pain"            [facility] PT Assessment - 07/16/16 0924      PROM   PROM Assessment Site Shoulder   Right Shoulder Flexion 114 Degrees   Right Shoulder ABduction 74 Degrees   Right Shoulder Internal Rotation 40 Degrees   Right Shoulder External Rotation 40 Degrees                     [facility] Adult PT Treatment/Exercise - 07/16/16 0853      Shoulder Exercises: Supine   Flexion AROM;Right;10 reps   Flexion Limitations pain with eccentric return     Shoulder Exercises: Sidelying   External Rotation Right;10 reps   External Rotation Limitations min cues for tecnhnique   ABduction Right;10 reps     Shoulder Exercises: Standing   Internal Rotation Right;10 reps;Theraband   Theraband Level (Shoulder Internal Rotation) Level 2 (Red)   Extension Right;10 reps;Theraband   Theraband Level (Shoulder Extension) Level 2 (Red)   Row Both;10 reps;Theraband   Theraband Level (Shoulder Row) Level 2 (Red)   Row Limitations 5 sec hold     Shoulder Exercises: Pulleys   Flexion 3 minutes   ABduction 3 minutes   ABduction Limitations scaption     Manual  Therapy   Manual Therapy Passive ROM   Manual therapy comments pt supine   Passive ROM R shoulder flexion, abduction, ir/er to tolerance                  PT Short Term Goals - 07/14/16 1014      PT SHORT TERM GOAL #1   Title independent with initial HEP (07/21/16)   Time 4   Period Weeks   Status Partially Met  pt. still requiring cueing for wand activities     PT SHORT TERM GOAL #2   Title improve R shoulder PROM by 15 degrees all motions for improved mobility (07/21/16)   Time 4   Period Weeks   Status Partially Met           PT Long Term Goals - 06/25/16 0857      PT LONG TERM GOAL #1   Title independent with advanced HEP (08/18/16)   Time 8   Period Weeks   Status On-going      PT LONG TERM GOAL #2   Title demonstrate R shoulder AROM flexion 110 degrees; abduction 90 degrees and internal rotation 50 degrees for improved function (08/18/16)   Time 8   Period Weeks   Status On-going     PT LONG TERM GOAL #3   Title demonstrate 4/5 strength in abduction, flexion and internal rotation for improved R shoulder function (08/18/16)   Time 8   Period Weeks   Status On-going     PT LONG TERM GOAL #4   Title independent with ADLs without increase in pain (08/18/16)   Time 8   Period Weeks   Status On-going               Plan - 07/16/16 1042    Clinical Impression Statement Pt's ROM improving with continued limitations in flexion and abduction.  Pain mostly controlled at this time and is mainly limited by decreased strength.  Planning to continue PT 2x/wk x 4 additional weeks and will reassess then.   PT Treatment/Interventions ADLs/Self Care Home Management;Cryotherapy;Electrical Stimulation;Moist Heat;Balance training;Therapeutic exercise;Therapeutic activities;Patient/family education;Gait training;Manual techniques;Passive range of motion;Vasopneumatic Device;Dry needling   PT Next Visit Plan assess STGs, continue strengthening as tolerated   Consulted and Agree with Plan of Care Patient      Patient will benefit from skilled therapeutic intervention in order to improve the following deficits and impairments:  Pain, Impaired UE functional use, Decreased strength, Increased edema, Decreased balance, Decreased range of motion, Postural dysfunction  Visit Diagnosis: Acute pain of right shoulder  Stiffness of right shoulder, not elsewhere classified  Abnormal posture  Muscle weakness (generalized)     Problem List Patient Active Problem List   Diagnosis Date Noted  . 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  [place], [place], [address] Phone: [phone]   Fax:  [phone]  Name: [name] Soto MRN: [id number] Date of Birth: 09-09-1946

## 2016-07-21 ENCOUNTER — Ambulatory Visit: Payer: Medicare Other | Admitting: Physical Therapy

## 2016-07-21 DIAGNOSIS — M25511 Pain in right shoulder: Secondary | ICD-10-CM

## 2016-07-21 DIAGNOSIS — M6281 Muscle weakness (generalized): Secondary | ICD-10-CM

## 2016-07-21 DIAGNOSIS — M25611 Stiffness of right shoulder, not elsewhere classified: Secondary | ICD-10-CM

## 2016-07-21 DIAGNOSIS — R293 Abnormal posture: Secondary | ICD-10-CM

## 2016-07-21 NOTE — Therapy (Signed)
[facility] [address]  [place], [place], [address] Phone: [phone]   Fax:  [phone]  Physical Therapy Treatment  Patient Details  Name: [name] Soto MRN: [id number] Date of Birth: 09-07-1946 Referring Provider: Dr. Justice Britain  Encounter Date: 07/21/2016      PT End of Session - 07/21/16 0938    Visit Number 9   Number of Visits 16   Date for PT Re-Evaluation 08/18/16   PT Start Time 0846   PT Stop Time 0931   PT Time Calculation (min) 45 min   Activity Tolerance Patient tolerated treatment well   Behavior During Therapy [facility] for tasks assessed/performed      Past Medical History:  Diagnosis Date  . Cerebral thrombosis with cerebral infarction ([place])   . Diabetes mellitus without complication ([place])   . GERD (gastroesophageal reflux disease)   . Humerus fracture    PROXIMAL RIGHT  . Hypertension   . Pancreatic lesion   . Stroke [facility])     Past Surgical History:  Procedure Laterality Date  . CATARACT EXTRACTION     RIGHT  . EUS N/A 07/17/2015   Procedure: ESOPHAGEAL ENDOSCOPIC ULTRASOUND (EUS) RADIAL;  Surgeon: Arta Silence, MD;  [facility];  Service: Endoscopy;  Laterality: N/A;  . FINE NEEDLE ASPIRATION N/A 07/17/2015   Procedure: FINE NEEDLE ASPIRATION (FNA) RADIAL;  Surgeon: Arta Silence, MD;  [facility];  Service: Endoscopy;  Laterality: N/A;  . HEMATOMA DRAINED    . HERNIA REPAIR     inguinal right  . HYDROCELE EXCISION    . REVERSE SHOULDER ARTHROPLASTY Right 05/07/2016   Procedure: RIGHT REVERSE TOTAL SHOULDER ARTHROPLASTY;  Surgeon: Justice Britain, MD;  Location: [place];  Service: Orthopedics;  Laterality: Right;    There were no vitals filed for this visit.      Subjective Assessment - 07/21/16 0851    Subjective MD is very pleased with progress so far; has order for 4 more weeks of PT   Pertinent History CVA (residual L sided weakness), DM, HTN,  pancreatic lesion   Limitations Lifting;House hold activities   Patient Stated Goals improve use of R arm   Currently in Pain? No/denies                         [facility] Adult PT Treatment/Exercise - 07/21/16 0852      Shoulder Exercises: Standing   Protraction Right;15 reps;Theraband   Theraband Level (Shoulder Protraction) Level 2 (Red)   Internal Rotation Right;15 reps;Theraband   Theraband Level (Shoulder Internal Rotation) Level 2 (Red)   Extension Right;15 reps;Theraband   Theraband Level (Shoulder Extension) Level 2 (Red)     Shoulder Exercises: Pulleys   Flexion 3 minutes   ABduction 3 minutes   ABduction Limitations scaption     Shoulder Exercises: Therapy Ball   Flexion 10 reps   Flexion Limitations 10 sec hold     Shoulder Exercises: ROM/Strengthening   UBE (Upper Arm Bike) L 1.0 x 4 min     Manual Therapy   Manual Therapy Passive ROM   Manual therapy comments pt supine   Passive ROM R shoulder flexion, abduction, ir/er to tolerance                  PT Short Term Goals - 07/14/16 1014      PT SHORT TERM GOAL #1   Title independent with initial HEP (07/21/16)  Time 4   Period Weeks   Status Partially Met  pt. still requiring cueing for wand activities     PT SHORT TERM GOAL #2   Title improve R shoulder PROM by 15 degrees all motions for improved mobility (07/21/16)   Time 4   Period Weeks   Status Partially Met           PT Long Term Goals - 06/25/16 0857      PT LONG TERM GOAL #1   Title independent with advanced HEP (08/18/16)   Time 8   Period Weeks   Status On-going     PT LONG TERM GOAL #2   Title demonstrate R shoulder AROM flexion 110 degrees; abduction 90 degrees and internal rotation 50 degrees for improved function (08/18/16)   Time 8   Period Weeks   Status On-going     PT LONG TERM GOAL #3   Title demonstrate 4/5 strength in abduction, flexion and internal rotation for improved R shoulder function  (08/18/16)   Time 8   Period Weeks   Status On-going     PT LONG TERM GOAL #4   Title independent with ADLs without increase in pain (08/18/16)   Time 8   Period Weeks   Status On-going               Plan - 07/21/16 0939    Clinical Impression Statement Pt reports MD very pleased with progress so far.  Slowly progressing with strengthening exercises and was able to progress to theraband for HEP.  Will continue to benefit from PT to maximize function.   PT Treatment/Interventions ADLs/Self Care Home Management;Cryotherapy;Electrical Stimulation;Moist Heat;Balance training;Therapeutic exercise;Therapeutic activities;Patient/family education;Gait training;Manual techniques;Passive range of motion;Vasopneumatic Device;Dry needling   PT Next Visit Plan assess STGs, needs g code; continue strengthening as tolerated   Consulted and Agree with Plan of Care Patient      Patient will benefit from skilled therapeutic intervention in order to improve the following deficits and impairments:  Pain, Impaired UE functional use, Decreased strength, Increased edema, Decreased balance, Decreased range of motion, Postural dysfunction  Visit Diagnosis: Acute pain of right shoulder  Stiffness of right shoulder, not elsewhere classified  Abnormal posture  Muscle weakness (generalized)     Problem List Patient Active Problem List   Diagnosis Date Noted  . 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  [place], [place], [address] Phone: [phone]   Fax:  [phone]  Name: RONNEL ZUERCHER MRN: [id number] Date of Birth: [birth year]

## 2016-07-21 NOTE — Patient Instructions (Signed)
Strengthening: Resisted Internal Rotation   Hold tubing in right hand, elbow at side and forearm out. Rotate forearm in across body. Repeat __15__ times per set. Do _1-2___ sets per session. Do _1-2___ sessions per day.  http://orth.exer.us/830   Copyright  VHI. All rights reserved.     Strengthening: Resisted Flexion   PUNCH FORWARD!! Hold tubing with right arm at side. Pull forward and up. Move shoulder through pain-free range of motion. Repeat __15__ times per set. Do __1-2__ sets per session. Do __1-2__ sessions per day.  http://orth.exer.us/824   Copyright  VHI. All rights reserved.    Strengthening: Resisted Extension   Hold tubing in right hand, arm forward. Pull arm back, elbow straight. Repeat _15___ times per set. Do _1-2___ sets per session. Do __1-2__ sessions per day.  http://orth.exer.us/832   Copyright  VHI. All rights reserved.

## 2016-07-23 ENCOUNTER — Ambulatory Visit: Payer: Medicare Other

## 2016-07-23 DIAGNOSIS — M25611 Stiffness of right shoulder, not elsewhere classified: Secondary | ICD-10-CM

## 2016-07-23 DIAGNOSIS — M25511 Pain in right shoulder: Secondary | ICD-10-CM | POA: Diagnosis not present

## 2016-07-23 DIAGNOSIS — M6281 Muscle weakness (generalized): Secondary | ICD-10-CM

## 2016-07-23 DIAGNOSIS — R293 Abnormal posture: Secondary | ICD-10-CM

## 2016-07-23 NOTE — Therapy (Signed)
[facility] [address]  [place], [place], [address] Phone: [phone]   Fax:  [phone]  Physical Therapy Treatment  Patient Details  Name: [name] Soto MRN: [id number] Date of Birth: 08/02/1946 Referring Provider: Dr. Justice Britain  Encounter Date: 07/23/2016      PT End of Session - 07/23/16 0857    Visit Number 10   Number of Visits 16   Date for PT Re-Evaluation 08/18/16   PT Start Time 0849  Pt. arrived late   PT Stop Time 0928   PT Time Calculation (min) 39 min   Activity Tolerance Patient tolerated treatment well   Behavior During Therapy [facility] for tasks assessed/performed      Past Medical History:  Diagnosis Date  . Cerebral thrombosis with cerebral infarction ([place])   . Diabetes mellitus without complication ([place])   . GERD (gastroesophageal reflux disease)   . Humerus fracture    PROXIMAL RIGHT  . Hypertension   . Pancreatic lesion   . Stroke [facility])     Past Surgical History:  Procedure Laterality Date  . CATARACT EXTRACTION     RIGHT  . EUS N/A 07/17/2015   Procedure: ESOPHAGEAL ENDOSCOPIC ULTRASOUND (EUS) RADIAL;  Surgeon: Arta Silence, MD;  [facility];  Service: Endoscopy;  Laterality: N/A;  . FINE NEEDLE ASPIRATION N/A 07/17/2015   Procedure: FINE NEEDLE ASPIRATION (FNA) RADIAL;  Surgeon: Arta Silence, MD;  [facility];  Service: Endoscopy;  Laterality: N/A;  . HEMATOMA DRAINED    . HERNIA REPAIR     inguinal right  . HYDROCELE EXCISION    . REVERSE SHOULDER ARTHROPLASTY Right 05/07/2016   Procedure: RIGHT REVERSE TOTAL SHOULDER ARTHROPLASTY;  Surgeon: Justice Britain, MD;  Location: [place];  Service: Orthopedics;  Laterality: Right;    There were no vitals filed for this visit.      Subjective Assessment - 07/23/16 0853    Subjective Pt. reporting only discomfort in shoulder initially this morning.   Patient Stated Goals improve use of R arm   Currently in  Pain? No/denies   Pain Score 0-No pain   Multiple Pain Sites No            [facility] PT Assessment - 07/23/16 0855      Assessment   Medical Diagnosis R reverse total shoulder   Referring Provider Dr. Justice Britain   Next MD Visit 08/17/16     Observation/Other Assessments   Focus on Therapeutic Outcomes (FOTO)  51% (49% limitation     PROM   PROM Assessment Site Shoulder   Right/Left Shoulder Right   Right Shoulder Flexion 120 Degrees   Right Shoulder ABduction 85 Degrees   Right Shoulder Internal Rotation 44 Degrees   Right Shoulder External Rotation 43 Degrees           [facility] Adult PT Treatment/Exercise - 07/23/16 0906      Shoulder Exercises: Standing   Protraction Right;15 reps;Theraband   Theraband Level (Shoulder Protraction) Level 2 (Red)   Internal Rotation Right;15 reps;Theraband   Theraband Level (Shoulder Internal Rotation) Level 2 (Red)   Extension Right;15 reps;Theraband   Theraband Level (Shoulder Extension) Level 2 (Red)   Row Both;Theraband;15 reps   Theraband Level (Shoulder Row) Level 2 (Red)   Row Limitations 5 sec hold     Shoulder Exercises: Pulleys   Flexion 3 minutes   ABduction 3 minutes   ABduction Limitations scaption     Manual Therapy  Manual Therapy Passive ROM   Manual therapy comments pt supine   Passive ROM R shoulder flexion, abduction, ir/er to tolerance             PT Short Term Goals - 12-Aug-2016 0916      PT SHORT TERM GOAL #1   Title independent with initial HEP (07/21/16)   Time 4   Period Weeks   Status Achieved     PT SHORT TERM GOAL #2   Title improve R shoulder PROM by 15 degrees all motions for improved mobility (07/21/16)   Time 4   Period Weeks   Status Partially Met           PT Long Term Goals - 06/25/16 0857      PT LONG TERM GOAL #1   Title independent with advanced HEP (08/18/16)   Time 8   Period Weeks   Status On-going     PT LONG TERM GOAL #2   Title demonstrate R shoulder AROM  flexion 110 degrees; abduction 90 degrees and internal rotation 50 degrees for improved function (08/18/16)   Time 8   Period Weeks   Status On-going     PT LONG TERM GOAL #3   Title demonstrate 4/5 strength in abduction, flexion and internal rotation for improved R shoulder function (08/18/16)   Time 8   Period Weeks   Status On-going     PT LONG TERM GOAL #4   Title independent with ADLs without increase in pain (08/18/16)   Time 8   Period Weeks   Status On-going               Plan - Aug 12, 2016 6144    Clinical Impression Statement Pt. with good recall of recent addition to HEP with TB today however still requiring frequent verbal/tactile cueing to avoid trunk rotation and substitutions.  Pt. able to demo good improvement in R shoulder PROM today able to partially meet STG for ROM.  Pt. progressing well able to advance in reps with conservative strengthening activity today.  Will plan to advance shoulder strengthening activity per pt. tolerance in coming visits.     PT Treatment/Interventions ADLs/Self Care Home Management;Cryotherapy;Electrical Stimulation;Moist Heat;Balance training;Therapeutic exercise;Therapeutic activities;Patient/family education;Gait training;Manual techniques;Passive range of motion;Vasopneumatic Device;Dry needling   PT Next Visit Plan Continue strengthening as tolerated      Patient will benefit from skilled therapeutic intervention in order to improve the following deficits and impairments:  Pain, Impaired UE functional use, Decreased strength, Increased edema, Decreased balance, Decreased range of motion, Postural dysfunction  Visit Diagnosis: Acute pain of right shoulder  Stiffness of right shoulder, not elsewhere classified  Abnormal posture  Muscle weakness (generalized)       G-Codes - 12-Aug-2016 1326    Functional Assessment Tool Used FOTO 49% limited   Functional Limitation Carrying, moving and handling objects   Carrying, Moving and  Handling Objects Current Status [phone]) At least 40 percent but less than 60 percent impaired, limited or restricted   Carrying, Moving and Handling Objects Goal Status ([id number]) At least 20 percent but less than 40 percent impaired, limited or restricted      Problem List Patient Active Problem List   Diagnosis Date Noted  . 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

## 2016-07-28 ENCOUNTER — Ambulatory Visit: Payer: Medicare Other

## 2016-07-28 DIAGNOSIS — R293 Abnormal posture: Secondary | ICD-10-CM

## 2016-07-28 DIAGNOSIS — M25611 Stiffness of right shoulder, not elsewhere classified: Secondary | ICD-10-CM

## 2016-07-28 DIAGNOSIS — M6281 Muscle weakness (generalized): Secondary | ICD-10-CM

## 2016-07-28 DIAGNOSIS — M25511 Pain in right shoulder: Secondary | ICD-10-CM

## 2016-07-28 NOTE — Therapy (Signed)
[facility] [address]  [place], [place], [address] Phone: [phone]   Fax:  [phone]  Physical Therapy Treatment  Patient Details  Name: [name] Soto MRN: [id number] Date of Birth: [birth year] Referring Provider: Dr. Justice Britain  Encounter Date: 07/28/2016      PT End of Session - 07/28/16 0916    Visit Number 11   Number of Visits 16   Date for PT Re-Evaluation 08/18/16   PT Start Time 3790   PT Stop Time 0927   PT Time Calculation (min) 40 min   Activity Tolerance Patient tolerated treatment well   Behavior During Therapy [facility] for tasks assessed/performed      Past Medical History:  Diagnosis Date  . Cerebral thrombosis with cerebral infarction ([place])   . Diabetes mellitus without complication ([place])   . GERD (gastroesophageal reflux disease)   . Humerus fracture    PROXIMAL RIGHT  . Hypertension   . Pancreatic lesion   . Stroke [facility])     Past Surgical History:  Procedure Laterality Date  . CATARACT EXTRACTION     RIGHT  . EUS N/A 07/17/2015   Procedure: ESOPHAGEAL ENDOSCOPIC ULTRASOUND (EUS) RADIAL;  Surgeon: Arta Silence, MD;  [facility];  Service: Endoscopy;  Laterality: N/A;  . FINE NEEDLE ASPIRATION N/A 07/17/2015   Procedure: FINE NEEDLE ASPIRATION (FNA) RADIAL;  Surgeon: Arta Silence, MD;  [facility];  Service: Endoscopy;  Laterality: N/A;  . HEMATOMA DRAINED    . HERNIA REPAIR     inguinal right  . HYDROCELE EXCISION    . REVERSE SHOULDER ARTHROPLASTY Right 05/07/2016   Procedure: RIGHT REVERSE TOTAL SHOULDER ARTHROPLASTY;  Surgeon: Justice Britain, MD;  Location: [place];  Service: Orthopedics;  Laterality: Right;    There were no vitals filed for this visit.      Subjective Assessment - 07/28/16 1057    Subjective Pt. reporting continued discomfort in distal, lateral R shoulder today however unable to assign a number to this.    Patient Stated Goals  improve use of R arm   Currently in Pain? No/denies   Pain Score 0-No pain   Multiple Pain Sites No           [facility] Adult PT Treatment/Exercise - 07/28/16 0905      Shoulder Exercises: Supine   Flexion AROM;Right;15 reps   Shoulder Flexion Weight (lbs) 1# with wand   Flexion Limitations pain with eccentric return     Shoulder Exercises: Standing   Protraction Right;15 reps;Theraband   Theraband Level (Shoulder Protraction) Level 3 (Green)   Internal Rotation Right;15 reps;Theraband   Theraband Level (Shoulder Internal Rotation) Level 2 (Red)   Extension Right;15 reps;Theraband   Theraband Level (Shoulder Extension) Level 2 (Red)   Row Both;Theraband;15 reps   Theraband Level (Shoulder Row) Level 3 (Green)   Row Limitations 3 sec hold     Shoulder Exercises: Pulleys   Flexion 3 minutes   ABduction 3 minutes   ABduction Limitations scaption                PT Education - 07/28/16 1134    Education provided Yes   Education Details scapular retraction with green TB, scapular retraction/extension with green TB   Person(s) Educated Patient   Methods Explanation;Demonstration;Verbal cues  Could not handout paper copy or TB due to printer issues will plan to next treatment          PT Short Term  Goals - 07/23/16 0916      PT SHORT TERM GOAL #1   Title independent with initial HEP (07/21/16)   Time 4   Period Weeks   Status Achieved     PT SHORT TERM GOAL #2   Title improve R shoulder PROM by 15 degrees all motions for improved mobility (07/21/16)   Time 4   Period Weeks   Status Partially Met           PT Long Term Goals - 06/25/16 0857      PT LONG TERM GOAL #1   Title independent with advanced HEP (08/18/16)   Time 8   Period Weeks   Status On-going     PT LONG TERM GOAL #2   Title demonstrate R shoulder AROM flexion 110 degrees; abduction 90 degrees and internal rotation 50 degrees for improved function (08/18/16)   Time 8   Period Weeks    Status On-going     PT LONG TERM GOAL #3   Title demonstrate 4/5 strength in abduction, flexion and internal rotation for improved R shoulder function (08/18/16)   Time 8   Period Weeks   Status On-going     PT LONG TERM GOAL #4   Title independent with ADLs without increase in pain (08/18/16)   Time 8   Period Weeks   Status On-going               Plan - 07/28/16 1102    Clinical Impression Statement Pt. able to progress with conservative scapular strengthening TB activities today without pain or fatigue.  Scap. retraction and scap. retraction/extension added to HEP today however handout and green TB not issued to pt. today due to printer problems.  Will plan to issue this next treatment.  Pt. emailed copy of HEP addition also.  Pt. progressing well however still requiring mirror feedback and occasional verbal/tactile cueing to avoid scap. elevation, and trunk rotation with therex.  Pt. instructed to stop performing HEP with doorway isometrics and pendulums today and focus on recent HEP additions.     PT Treatment/Interventions ADLs/Self Care Home Management;Cryotherapy;Electrical Stimulation;Moist Heat;Balance training;Therapeutic exercise;Therapeutic activities;Patient/family education;Gait training;Manual techniques;Passive range of motion;Vasopneumatic Device;Dry needling   PT Next Visit Plan HEP handout of retraction, retraction/extension with green TB, (see pt. instruction); Continue strengthening as tolerated      Patient will benefit from skilled therapeutic intervention in order to improve the following deficits and impairments:  Pain, Impaired UE functional use, Decreased strength, Increased edema, Decreased balance, Decreased range of motion, Postural dysfunction  Visit Diagnosis: Acute pain of right shoulder  Stiffness of right shoulder, not elsewhere classified  Abnormal posture  Muscle weakness (generalized)     Problem List Patient Active Problem List    Diagnosis Date Noted  . 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

## 2016-07-30 ENCOUNTER — Ambulatory Visit: Payer: Medicare Other | Admitting: Physical Therapy

## 2016-07-30 DIAGNOSIS — M25611 Stiffness of right shoulder, not elsewhere classified: Secondary | ICD-10-CM

## 2016-07-30 DIAGNOSIS — M6281 Muscle weakness (generalized): Secondary | ICD-10-CM

## 2016-07-30 DIAGNOSIS — M25511 Pain in right shoulder: Secondary | ICD-10-CM | POA: Diagnosis not present

## 2016-07-30 DIAGNOSIS — R293 Abnormal posture: Secondary | ICD-10-CM

## 2016-07-30 NOTE — Therapy (Signed)
[facility] [address]  [place], [place], [address] Phone: [phone]   Fax:  [phone]  Physical Therapy Treatment  Patient Details  Name: [name] Soto MRN: [id number] Date of Birth: [birth year] Referring Provider: Dr. Justice Britain  Encounter Date: 07/30/2016      PT End of Session - 07/30/16 0853    Visit Number 12   Number of Visits 16   Date for PT Re-Evaluation 08/18/16   PT Start Time 0846   PT Stop Time 0928   PT Time Calculation (min) 42 min   Activity Tolerance Patient tolerated treatment well   Behavior During Therapy [facility] for tasks assessed/performed      Past Medical History:  Diagnosis Date  . Cerebral thrombosis with cerebral infarction ([place])   . Diabetes mellitus without complication ([place])   . GERD (gastroesophageal reflux disease)   . Humerus fracture    PROXIMAL RIGHT  . Hypertension   . Pancreatic lesion   . Stroke [facility])     Past Surgical History:  Procedure Laterality Date  . CATARACT EXTRACTION     RIGHT  . EUS N/A 07/17/2015   Procedure: ESOPHAGEAL ENDOSCOPIC ULTRASOUND (EUS) RADIAL;  Surgeon: Arta Silence, MD;  [facility];  Service: Endoscopy;  Laterality: N/A;  . FINE NEEDLE ASPIRATION N/A 07/17/2015   Procedure: FINE NEEDLE ASPIRATION (FNA) RADIAL;  Surgeon: Arta Silence, MD;  [facility];  Service: Endoscopy;  Laterality: N/A;  . HEMATOMA DRAINED    . HERNIA REPAIR     inguinal right  . HYDROCELE EXCISION    . REVERSE SHOULDER ARTHROPLASTY Right 05/07/2016   Procedure: RIGHT REVERSE TOTAL SHOULDER ARTHROPLASTY;  Surgeon: Justice Britain, MD;  Location: [place];  Service: Orthopedics;  Laterality: Right;    There were no vitals filed for this visit.      Subjective Assessment - 07/30/16 0850    Subjective Patient reporitng some soreness in proximal posterior shoulder as well as UT area - doesn't feel like its anything in particular   Pertinent  History CVA (residual L sided weakness), DM, HTN, pancreatic lesion   Limitations Lifting;House hold activities   Patient Stated Goals improve use of R arm   Currently in Pain? No/denies  "no pain, but I still have that achy feeling"                         [facility] Adult PT Treatment/Exercise - 07/30/16 0852      Shoulder Exercises: Seated   Flexion Right;AROM;15 reps   Flexion Limitations mirror to reduce compensations     Shoulder Exercises: Sidelying   External Rotation Right;15 reps;Weights   External Rotation Weight (lbs) 1   External Rotation Limitations 2 sets     Shoulder Exercises: Standing   External Rotation Right;15 reps;Theraband  2 sets   Theraband Level (Shoulder External Rotation) Level 2 (Red)   Internal Rotation Right;15 reps;Theraband  2 sets   Theraband Level (Shoulder Internal Rotation) Level 2 (Red)   Extension Right;15 reps;Theraband   Theraband Level (Shoulder Extension) Level 3 (Green)   Extension Limitations 2 sets   Row Both;15 reps;Weights   Theraband Level (Shoulder Row) Level 3 (Green)   Row Limitations 2 sets     Shoulder Exercises: Pulleys   Flexion 3 minutes   ABduction 3 minutes   ABduction Limitations more scaption than true abduction     Shoulder Exercises: Therapy Ball   Flexion  15 reps   Flexion Limitations 5-10 second hold      Shoulder Exercises: ROM/Strengthening   UBE (Upper Arm Bike) L 1 x 4 minutes                  PT Short Term Goals - 07/23/16 0916      PT SHORT TERM GOAL #1   Title independent with initial HEP (07/21/16)   Time 4   Period Weeks   Status Achieved     PT SHORT TERM GOAL #2   Title improve R shoulder PROM by 15 degrees all motions for improved mobility (07/21/16)   Time 4   Period Weeks   Status Partially Met           PT Long Term Goals - 06/25/16 0857      PT LONG TERM GOAL #1   Title independent with advanced HEP (08/18/16)   Time 8   Period Weeks   Status  On-going     PT LONG TERM GOAL #2   Title demonstrate R shoulder AROM flexion 110 degrees; abduction 90 degrees and internal rotation 50 degrees for improved function (08/18/16)   Time 8   Period Weeks   Status On-going     PT LONG TERM GOAL #3   Title demonstrate 4/5 strength in abduction, flexion and internal rotation for improved R shoulder function (08/18/16)   Time 8   Period Weeks   Status On-going     PT LONG TERM GOAL #4   Title independent with ADLs without increase in pain (08/18/16)   Time 8   Period Weeks   Status On-going               Plan - 07/30/16 0925    Clinical Impression Statement Patient today with some subjective reports of proximal R shoulder and UT pain, which he reports seemed to "clear up" by end of session. Patient with most difficulty with all ER tasks with VC to reduce compensations needed at all times. Patient to continue to benefit from skilled PT intervention to progress functional use of R UE.    PT Treatment/Interventions ADLs/Self Care Home Management;Cryotherapy;Electrical Stimulation;Moist Heat;Balance training;Therapeutic exercise;Therapeutic activities;Patient/family education;Gait training;Manual techniques;Passive range of motion;Vasopneumatic Device;Dry needling   PT Next Visit Plan  Continue strengthening as tolerated   Consulted and Agree with Plan of Care Patient      Patient will benefit from skilled therapeutic intervention in order to improve the following deficits and impairments:  Pain, Impaired UE functional use, Decreased strength, Increased edema, Decreased balance, Decreased range of motion, Postural dysfunction  Visit Diagnosis: Acute pain of right shoulder  Stiffness of right shoulder, not elsewhere classified  Abnormal posture  Muscle weakness (generalized)     Problem List Patient Active Problem List   Diagnosis Date Noted  . 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  [place], [place], [address] Phone: [phone]   Fax:  [phone]  Name: [name] Soto MRN: [id number] Date of Birth: 1945-12-22

## 2016-08-03 ENCOUNTER — Ambulatory Visit: Payer: Medicare Other

## 2016-08-03 DIAGNOSIS — M25611 Stiffness of right shoulder, not elsewhere classified: Secondary | ICD-10-CM

## 2016-08-03 DIAGNOSIS — M25511 Pain in right shoulder: Secondary | ICD-10-CM | POA: Diagnosis not present

## 2016-08-03 DIAGNOSIS — M6281 Muscle weakness (generalized): Secondary | ICD-10-CM

## 2016-08-03 DIAGNOSIS — R293 Abnormal posture: Secondary | ICD-10-CM

## 2016-08-03 NOTE — Therapy (Signed)
[facility] [address]  [place], [place], [address] Phone: [phone]   Fax:  [phone]  Physical Therapy Treatment  Patient Details  Name: [name] Soto MRN: [id number] Date of Birth: 1945/10/27 Referring Provider: Dr. Justice Britain  Encounter Date: 08/03/2016      PT End of Session - 08/03/16 0854    Visit Number 13   Number of Visits 16   Date for PT Re-Evaluation 08/18/16   PT Start Time 0847   PT Stop Time 0930   PT Time Calculation (min) 43 min   Activity Tolerance Patient tolerated treatment well   Behavior During Therapy [facility] for tasks assessed/performed      Past Medical History:  Diagnosis Date  . Cerebral thrombosis with cerebral infarction ([place])   . Diabetes mellitus without complication ([place])   . GERD (gastroesophageal reflux disease)   . Humerus fracture    PROXIMAL RIGHT  . Hypertension   . Pancreatic lesion   . Stroke [facility])     Past Surgical History:  Procedure Laterality Date  . CATARACT EXTRACTION     RIGHT  . EUS N/A 07/17/2015   Procedure: ESOPHAGEAL ENDOSCOPIC ULTRASOUND (EUS) RADIAL;  Surgeon: Arta Silence, MD;  [facility];  Service: Endoscopy;  Laterality: N/A;  . FINE NEEDLE ASPIRATION N/A 07/17/2015   Procedure: FINE NEEDLE ASPIRATION (FNA) RADIAL;  Surgeon: Arta Silence, MD;  [facility];  Service: Endoscopy;  Laterality: N/A;  . HEMATOMA DRAINED    . HERNIA REPAIR     inguinal right  . HYDROCELE EXCISION    . REVERSE SHOULDER ARTHROPLASTY Right 05/07/2016   Procedure: RIGHT REVERSE TOTAL SHOULDER ARTHROPLASTY;  Surgeon: Justice Britain, MD;  Location: [place];  Service: Orthopedics;  Laterality: Right;    There were no vitals filed for this visit.      Subjective Assessment - 08/03/16 0852    Subjective Pt. reporting he has been feeling the same lateral shoulder pain over the weekend as always however shoulder is just stiff this morning.   Patient Stated Goals improve use of R arm   Currently in Pain? No/denies   Pain Score 0-No pain   Multiple Pain Sites No             [facility] Adult PT Treatment/Exercise - 08/03/16 0900      Shoulder Exercises: Standing   Internal Rotation Right;15 reps;Theraband   Theraband Level (Shoulder Internal Rotation) Level 3 (Green)   Internal Rotation Limitations --  mirror training used to avoid scap elevation and trank rot   Extension Right;15 reps;Theraband   Theraband Level (Shoulder Extension) Level 3 (Green)   Row Both;15 reps;Weights   Theraband Level (Shoulder Row) Level 3 (Green)   Other Standing Exercises Standing R shoulder flexion, scaption 1# at peg board      Shoulder Exercises: Pulleys   Flexion 3 minutes   ABduction 3 minutes   ABduction Limitations more scaption than true abduction     Shoulder Exercises: ROM/Strengthening   UBE (Upper Arm Bike) L 1 x 4 minutes   Cybex Row 10 reps  20#                  PT Short Term Goals - 07/23/16 0916      PT SHORT TERM GOAL #1   Title independent with initial HEP (07/21/16)   Time 4   Period Weeks   Status Achieved     PT SHORT TERM GOAL #  2   Title improve R shoulder PROM by 15 degrees all motions for improved mobility (07/21/16)   Time 4   Period Weeks   Status Partially Met           PT Long Term Goals - 06/25/16 0857      PT LONG TERM GOAL #1   Title independent with advanced HEP (08/18/16)   Time 8   Period Weeks   Status On-going     PT LONG TERM GOAL #2   Title demonstrate R shoulder AROM flexion 110 degrees; abduction 90 degrees and internal rotation 50 degrees for improved function (08/18/16)   Time 8   Period Weeks   Status On-going     PT LONG TERM GOAL #3   Title demonstrate 4/5 strength in abduction, flexion and internal rotation for improved R shoulder function (08/18/16)   Time 8   Period Weeks   Status On-going     PT LONG TERM GOAL #4   Title independent with ADLs without  increase in pain (08/18/16)   Time 8   Period Weeks   Status On-going               Plan - 08/03/16 0855    Clinical Impression Statement Pt. tolerated today shoulder and scapular strengthening activity well.  Pt. with 6/10 R shoulder pain with 1# flexion/scaption wall ladder however this quickly resolved to baseline.  Pt. continues to demo improved technique with R shoulder AROM with less substitutions.   Pt. progressing well and will continue to benefit from skilled therapy to improve R shoulder functional capacity and maximize function.     PT Treatment/Interventions ADLs/Self Care Home Management;Cryotherapy;Electrical Stimulation;Moist Heat;Balance training;Therapeutic exercise;Therapeutic activities;Patient/family education;Gait training;Manual techniques;Passive range of motion;Vasopneumatic Device;Dry needling   PT Next Visit Plan  Continue strengthening as tolerated      Patient will benefit from skilled therapeutic intervention in order to improve the following deficits and impairments:  Pain, Impaired UE functional use, Decreased strength, Increased edema, Decreased balance, Decreased range of motion, Postural dysfunction  Visit Diagnosis: Acute pain of right shoulder  Stiffness of right shoulder, not elsewhere classified  Abnormal posture  Muscle weakness (generalized)     Problem List Patient Active Problem List   Diagnosis Date Noted  . 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  [place], [place], [address] Phone: [phone]   Fax:  [phone]  Name: [name] Soto MRN: [id number] Date of Birth: 1946-09-10

## 2016-08-05 ENCOUNTER — Ambulatory Visit: Payer: Medicare Other

## 2016-08-05 DIAGNOSIS — M25511 Pain in right shoulder: Secondary | ICD-10-CM

## 2016-08-05 DIAGNOSIS — M6281 Muscle weakness (generalized): Secondary | ICD-10-CM

## 2016-08-05 DIAGNOSIS — R293 Abnormal posture: Secondary | ICD-10-CM

## 2016-08-05 DIAGNOSIS — M25611 Stiffness of right shoulder, not elsewhere classified: Secondary | ICD-10-CM

## 2016-08-05 NOTE — Therapy (Signed)
[facility] [address]  [place], [place], [address] Phone: [phone]   Fax:  [phone]  Physical Therapy Treatment  Patient Details  Name: [name] Soto MRN: [id number] Date of Birth: 14-Jul-1946 Referring Provider: Dr. Justice Britain  Encounter Date: 08/05/2016      PT End of Session - 08/05/16 0908    Visit Number 14   Number of Visits 16   Date for PT Re-Evaluation 08/18/16   PT Start Time 0847   PT Stop Time 0927   PT Time Calculation (min) 40 min   Activity Tolerance Patient tolerated treatment well   Behavior During Therapy [facility] for tasks assessed/performed      Past Medical History:  Diagnosis Date  . Cerebral thrombosis with cerebral infarction ([place])   . Diabetes mellitus without complication ([place])   . GERD (gastroesophageal reflux disease)   . Humerus fracture    PROXIMAL RIGHT  . Hypertension   . Pancreatic lesion   . Stroke [facility])     Past Surgical History:  Procedure Laterality Date  . CATARACT EXTRACTION     RIGHT  . EUS N/A 07/17/2015   Procedure: ESOPHAGEAL ENDOSCOPIC ULTRASOUND (EUS) RADIAL;  Surgeon: Arta Silence, MD;  [facility];  Service: Endoscopy;  Laterality: N/A;  . FINE NEEDLE ASPIRATION N/A 07/17/2015   Procedure: FINE NEEDLE ASPIRATION (FNA) RADIAL;  Surgeon: Arta Silence, MD;  [facility];  Service: Endoscopy;  Laterality: N/A;  . HEMATOMA DRAINED    . HERNIA REPAIR     inguinal right  . HYDROCELE EXCISION    . REVERSE SHOULDER ARTHROPLASTY Right 05/07/2016   Procedure: RIGHT REVERSE TOTAL SHOULDER ARTHROPLASTY;  Surgeon: Justice Britain, MD;  Location: [place];  Service: Orthopedics;  Laterality: Right;    There were no vitals filed for this visit.      Subjective Assessment - 08/05/16 0848    Subjective Pt. reporting, "no actual pain now just discomfort in my arm".     Patient Stated Goals improve use of R arm   Currently in Pain? No/denies   Pain Score 0-No pain   Multiple Pain Sites No            [facility] PT Assessment - 08/05/16 0925      PROM   PROM Assessment Site Shoulder   Right/Left Shoulder Right   Right Shoulder Flexion 130 Degrees   Right Shoulder ABduction 125 Degrees  abduction/scaption    Right Shoulder Internal Rotation 65 Degrees   Right Shoulder External Rotation 52 Degrees           [facility] Adult PT Treatment/Exercise - 08/05/16 0857      Shoulder Exercises: Seated   Flexion AROM;10 reps  3 sets   Flexion Limitations mirror to reduce compensations   Abduction Right;10 reps;AROM  3 sets; in scapular plane     Shoulder Exercises: Standing   Extension Right;15 reps;Theraband  5" hold; cues for scap squeeze   Theraband Level (Shoulder Extension) Level 3 (Green)   Extension Limitations 1 sets   Row Both;15 reps;Weights  3" hold; cues for scap squeeze   Theraband Level (Shoulder Row) Level 4 (Blue)   Row Limitations 1 set      Shoulder Exercises: Pulleys   Flexion 2 minutes   ABduction 2 minutes   ABduction Limitations more scaption than true abduction     Shoulder Exercises: ROM/Strengthening   UBE (Upper Arm Bike) L 1.5 x 2 min each dir.  Manual Therapy   Manual Therapy Passive ROM;Manual Traction  with prolonged gentle stretch into all motions with traction   Manual therapy comments pt supine   Passive ROM R shoulder flexion, abduction, ir/er to tolerance            PT Short Term Goals - 07/23/16 0916      PT SHORT TERM GOAL #1   Title independent with initial HEP (07/21/16)   Time 4   Period Weeks   Status Achieved     PT SHORT TERM GOAL #2   Title improve R shoulder PROM by 15 degrees all motions for improved mobility (07/21/16)   Time 4   Period Weeks   Status Partially Met           PT Long Term Goals - 06/25/16 0857      PT LONG TERM GOAL #1   Title independent with advanced HEP (08/18/16)   Time 8   Period Weeks   Status On-going     PT LONG TERM  GOAL #2   Title demonstrate R shoulder AROM flexion 110 degrees; abduction 90 degrees and internal rotation 50 degrees for improved function (08/18/16)   Time 8   Period Weeks   Status On-going     PT LONG TERM GOAL #3   Title demonstrate 4/5 strength in abduction, flexion and internal rotation for improved R shoulder function (08/18/16)   Time 8   Period Weeks   Status On-going     PT LONG TERM GOAL #4   Title independent with ADLs without increase in pain (08/18/16)   Time 8   Period Weeks   Status On-going               Plan - 08/05/16 0909    Clinical Impression Statement Today's treatment focused on R shoulder AROM flexion, abduction strengthening activity and manual stretching to improve R shoulder ROM.  Pt. only reporting mild R shoulder discomfort throughout treatment today.  Significant time taken today for gentle R shoulder stretching into all motions.  Pt. very guarded to start PROM however with good response to prolonged gentle stretching.  Pt. able to demo greatly improved R shoulder PROM today.  R shoulder PROM: flexion 130 dg, abd 125 dg, IR 65 dg, ER 52 dg. Pt. progressing well at this point.  Will plan to begin checking goals next visit.     PT Treatment/Interventions ADLs/Self Care Home Management;Cryotherapy;Electrical Stimulation;Moist Heat;Balance training;Therapeutic exercise;Therapeutic activities;Patient/family education;Gait training;Manual techniques;Passive range of motion;Vasopneumatic Device;Dry needling   PT Next Visit Plan Begin checking goals; Continue strengthening as tolerated      Patient will benefit from skilled therapeutic intervention in order to improve the following deficits and impairments:  Pain, Impaired UE functional use, Decreased strength, Increased edema, Decreased balance, Decreased range of motion, Postural dysfunction  Visit Diagnosis: Acute pain of right shoulder  Stiffness of right shoulder, not elsewhere classified  Abnormal  posture  Muscle weakness (generalized)     Problem List Patient Active Problem List   Diagnosis Date Noted  . 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  [place], [place], [address] Phone: [phone]   Fax:  [phone]  Name: [name] Soto MRN: [id number] Date of Birth: 1945-12-14

## 2016-08-11 ENCOUNTER — Ambulatory Visit: Payer: Medicare Other

## 2016-08-11 DIAGNOSIS — R293 Abnormal posture: Secondary | ICD-10-CM

## 2016-08-11 DIAGNOSIS — M25511 Pain in right shoulder: Secondary | ICD-10-CM | POA: Diagnosis not present

## 2016-08-11 DIAGNOSIS — M6281 Muscle weakness (generalized): Secondary | ICD-10-CM

## 2016-08-11 DIAGNOSIS — M25611 Stiffness of right shoulder, not elsewhere classified: Secondary | ICD-10-CM

## 2016-08-11 NOTE — Therapy (Signed)
[facility] [address]  [place], [place], [address] Phone: [phone]   Fax:  [phone]  Physical Therapy Treatment  Patient Details  Name: [name] Soto MRN: [id number] Date of Birth: 1946/01/21 Referring Provider: Dr. Justice Britain  Encounter Date: 08/11/2016      PT End of Session - 08/11/16 0853    Visit Number 15   Number of Visits 16   Date for PT Re-Evaluation 08/18/16   PT Start Time 0849   PT Stop Time 0928   PT Time Calculation (min) 39 min   Activity Tolerance Patient tolerated treatment well   Behavior During Therapy [facility] for tasks assessed/performed      Past Medical History:  Diagnosis Date  . Cerebral thrombosis with cerebral infarction ([place])   . Diabetes mellitus without complication ([place])   . GERD (gastroesophageal reflux disease)   . Humerus fracture    PROXIMAL RIGHT  . Hypertension   . Pancreatic lesion   . Stroke [employer])     Past Surgical History:  Procedure Laterality Date  . CATARACT EXTRACTION     RIGHT  . EUS N/A 07/17/2015   Procedure: ESOPHAGEAL ENDOSCOPIC ULTRASOUND (EUS) RADIAL;  Surgeon: Arta Silence, MD;  Location[facility];  Service: Endoscopy;  Laterality: N/A;  . FINE NEEDLE ASPIRATION N/A 07/17/2015   Procedure: FINE NEEDLE ASPIRATION (FNA) RADIAL;  Surgeon: Arta Silence, MD;  [facility];  Service: Endoscopy;  Laterality: N/A;  . HEMATOMA DRAINED    . HERNIA REPAIR     inguinal right  . HYDROCELE EXCISION    . REVERSE SHOULDER ARTHROPLASTY Right 05/07/2016   Procedure: RIGHT REVERSE TOTAL SHOULDER ARTHROPLASTY;  Surgeon: Justice Britain, MD;  Location: [place];  Service: Orthopedics;  Laterality: Right;    There were no vitals filed for this visit.      Subjective Assessment - 08/11/16 0851    Subjective Pt. reporting the R shoulder, "felt the best it's ever felt over thanksgiving", however still reporting stiffness in the morning.     Patient  Stated Goals improve use of R arm   Currently in Pain? No/denies   Pain Score 0-No pain   Multiple Pain Sites No            [facility] PT Assessment - 08/11/16 0909      AROM   Right/Left Shoulder Right   Right Shoulder Flexion 95 Degrees  cues to prevent substitutions   Right Shoulder ABduction 79 Degrees  cues to prevent substitutions   Right Shoulder Internal Rotation 22 Degrees  taken in prone    Right Shoulder External Rotation 36 Degrees  taken in prone      Strength   Right/Left Shoulder Right   Right Shoulder Flexion 4-/5   Right Shoulder ABduction 4-/5   Right Shoulder Internal Rotation 4-/5   Right Shoulder External Rotation 3+/5      Today's treatment:   Therex: Pulleys R shoulder flexion, abduction 2 min each way UBE: lvl. 2.0, 3 min each way   Goal testing   MMT   ROM testing   Therex: Standing B shoulder flexion, abduction 2 x 10 reps; mirror used to prevent substitutions; ~ 80 dg each way before substitutions (scap. elevation)           PT Education - 08/11/16 0937    Education provided Yes   Education Details Standing flexion, scaption (no resistance) leaning next to wall   Person(s) Educated Patient  Methods Explanation;Demonstration;Verbal cues;Handout   Comprehension Verbalized understanding;Returned demonstration;Verbal cues required;Tactile cues required;Need further instruction          PT Short Term Goals - 07/23/16 0916      PT SHORT TERM GOAL #1   Title independent with initial HEP (07/21/16)   Time 4   Period Weeks   Status Achieved     PT SHORT TERM GOAL #2   Title improve R shoulder PROM by 15 degrees all motions for improved mobility (07/21/16)   Time 4   Period Weeks   Status Partially Met           PT Long Term Goals - 08/11/16 6659      PT LONG TERM GOAL #1   Title independent with advanced HEP (08/18/16)   Time 8   Period Weeks   Status On-going     PT LONG TERM GOAL #2   Title demonstrate R  shoulder AROM flexion 110 degrees; abduction 90 degrees and internal rotation 50 degrees for improved function (08/18/16)   Time 8   Period Weeks   Status On-going     PT LONG TERM GOAL #3   Title demonstrate 4/5 strength in abduction, flexion and internal rotation for improved R shoulder function (08/18/16)   Time 8   Period Weeks   Status Partially Met     PT LONG TERM GOAL #4   Title independent with ADLs without increase in pain (08/18/16)   Time 8   Period Weeks   Status On-going  08/11/16: still pain with putting on seat belt in car                Plan - 08/11/16 0858    Clinical Impression Statement Pt. reporting only stiffness initially today and reporting less frequent shoulder pain with daily activities such as putting on seat belt in car.  Pt. R shoulder strength grossly 4-/5 with testing today and AROM flex. 95 dg, abd 79 dg, IR 22 dg.  Pt. still very weak with all active R shoulder motions at this point, requiring scap. elevation substitutions with flexion, abduction motions in standing.  Pt. to return to therapy to see PT next visit.  Pt. would continue to benefit from skilled therapy to strengthen AROM and improve comfort and overhead functional capacity.     PT Treatment/Interventions ADLs/Self Care Home Management;Cryotherapy;Electrical Stimulation;Moist Heat;Balance training;Therapeutic exercise;Therapeutic activities;Patient/family education;Gait training;Manual techniques;Passive range of motion;Vasopneumatic Device;Dry needling   PT Next Visit Plan Re-cert; continue strengthening activities per tolerance      Patient will benefit from skilled therapeutic intervention in order to improve the following deficits and impairments:  Pain, Impaired UE functional use, Decreased strength, Increased edema, Decreased balance, Decreased range of motion, Postural dysfunction  Visit Diagnosis: Acute pain of right shoulder  Stiffness of right shoulder, not elsewhere  classified  Abnormal posture  Muscle weakness (generalized)     Problem List Patient Active Problem List   Diagnosis Date Noted  . 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

## 2016-08-13 ENCOUNTER — Ambulatory Visit: Payer: Medicare Other | Admitting: Physical Therapy

## 2016-08-13 DIAGNOSIS — M25511 Pain in right shoulder: Secondary | ICD-10-CM | POA: Diagnosis not present

## 2016-08-13 DIAGNOSIS — M6281 Muscle weakness (generalized): Secondary | ICD-10-CM

## 2016-08-13 DIAGNOSIS — R293 Abnormal posture: Secondary | ICD-10-CM

## 2016-08-13 DIAGNOSIS — M25611 Stiffness of right shoulder, not elsewhere classified: Secondary | ICD-10-CM

## 2016-08-13 NOTE — Therapy (Signed)
[facility] [address]  [place], [place], [address] Phone: [phone]   Fax:  [phone]  Physical Therapy Treatment  Patient Details  Name: [name] Soto MRN: [id number] Date of Birth: [birth year] Referring Provider: Dr. Justice Britain  Encounter Date: 08/13/2016      PT End of Session - 08/13/16 0856    Visit Number 16   Number of Visits 32   Date for PT Re-Evaluation 10/08/16   PT Start Time 0854   PT Stop Time 0932   PT Time Calculation (min) 38 min   Activity Tolerance Patient tolerated treatment well   Behavior During Therapy [facility] for tasks assessed/performed      Past Medical History:  Diagnosis Date  . Cerebral thrombosis with cerebral infarction ([place])   . Diabetes mellitus without complication ([place])   . GERD (gastroesophageal reflux disease)   . Humerus fracture    PROXIMAL RIGHT  . Hypertension   . Pancreatic lesion   . Stroke [facility])     Past Surgical History:  Procedure Laterality Date  . CATARACT EXTRACTION     RIGHT  . EUS N/A 07/17/2015   Procedure: ESOPHAGEAL ENDOSCOPIC ULTRASOUND (EUS) RADIAL;  Surgeon: Arta Silence, MD;  [facility];  Service: Endoscopy;  Laterality: N/A;  . FINE NEEDLE ASPIRATION N/A 07/17/2015   Procedure: FINE NEEDLE ASPIRATION (FNA) RADIAL;  Surgeon: Arta Silence, MD;  [facility];  Service: Endoscopy;  Laterality: N/A;  . HEMATOMA DRAINED    . HERNIA REPAIR     inguinal right  . HYDROCELE EXCISION    . REVERSE SHOULDER ARTHROPLASTY Right 05/07/2016   Procedure: RIGHT REVERSE TOTAL SHOULDER ARTHROPLASTY;  Surgeon: Justice Britain, MD;  Location: [facility];  Service: Orthopedics;  Laterality: Right;    There were no vitals filed for this visit.      Subjective Assessment - 08/13/16 0855    Subjective Patient is feeling well - feels like shoulder is loosening up a lot   Pertinent History CVA (residual L sided weakness), DM, HTN, pancreatic  lesion   Patient Stated Goals improve use of R arm   Currently in Pain? No/denies   Pain Score 0-No pain            [facility] PT Assessment - 08/13/16 0001      AROM   Right Shoulder Flexion 95 Degrees  cues to prevent substitutions   Right Shoulder ABduction 79 Degrees  cues to prevent substitutions   Right Shoulder Internal Rotation 22 Degrees  taken in prone    Right Shoulder External Rotation 36 Degrees  taken in prone      Strength   Right/Left Shoulder Right   Right Shoulder Flexion 4-/5   Right Shoulder ABduction 4-/5   Right Shoulder Internal Rotation 4-/5   Right Shoulder External Rotation 3+/5                     [facility] Adult PT Treatment/Exercise - 08/13/16 0905      Shoulder Exercises: Seated   Flexion AROM;Both;15 reps   Flexion Limitations mirror to reduce compensation   Abduction AROM;Both;15 reps   ABduction Limitations scaption - mirror to reduce compensation     Shoulder Exercises: Sidelying   External Rotation Right;15 reps;Weights   External Rotation Weight (lbs) 2   External Rotation Limitations 2 sets     Shoulder Exercises: Standing   External Rotation Both;15 reps;Theraband  elbows at 90 eith elbow at  side   Theraband Level (Shoulder External Rotation) Level 2 (Red)   Internal Rotation Right;15 reps;Theraband   Theraband Level (Shoulder Internal Rotation) Level 4 (Blue)   Extension Both;15 reps;Theraband   Theraband Level (Shoulder Extension) Level 3 (Green)   Row Both;15 reps;Theraband   Theraband Level (Shoulder Row) Level 4 (Blue)     Shoulder Exercises: Pulleys   Flexion 3 minutes   ABduction 3 minutes   ABduction Limitations more scaption than true abduction     Shoulder Exercises: ROM/Strengthening   UBE (Upper Arm Bike) L2 x 4 minutes (2/2)                  PT Short Term Goals - 08/13/16 1159      PT SHORT TERM GOAL #1   Title independent with initial HEP (07/21/16)   Time 4   Period Weeks   Status  Achieved     PT SHORT TERM GOAL #2   Title improve R shoulder PROM by 15 degrees all motions for improved mobility (07/21/16)   Time 4   Period Weeks   Status Achieved           PT Long Term Goals - 08/13/16 1201      PT LONG TERM GOAL #1   Title independent with advanced HEP (10/08/16)   Time 8   Period Weeks   Status On-going     PT LONG TERM GOAL #2   Title demonstrate R shoulder AROM flexion 110 degrees; abduction 90 degrees and internal rotation 50 degrees for improved function (10/08/16)   Time 8   Period Weeks   Status On-going     PT LONG TERM GOAL #3   Title demonstrate 4/5 strength in abduction, flexion and internal rotation for improved R shoulder function (10/08/16)   Time 8   Period Weeks   Status Partially Met     PT LONG TERM GOAL #4   Title independent with ADLs without increase in pain (10/08/16)   Time 8   Period Weeks   Status On-going  08/11/16: still pain with putting on seat belt in car                Plan - 08/13/16 0929    Clinical Impression Statement Patient has progressed well thus far as a result of PT, however, continues to demonstrate significant deficits with R shoulder AROM and strength with tendency to compensate for all shoulder motion. Patient continues to moderately guard R UE with required VC to extend R elbow during gait. Patient expressing that he feels like he is doing well with PT and would like to continue to progress ROM and strength for more functional use of R shoulder. Goals conitnue to be on-going with patient to continue to benefit from PT to progress R shoulder AROM and strength to promote improved functional use of R shoulder needed for all ADLs and IADLs.    Rehab Potential Good   PT Frequency 2x / week   PT Duration 8 weeks   PT Treatment/Interventions ADLs/Self Care Home Management;Cryotherapy;Electrical Stimulation;Moist Heat;Balance training;Therapeutic exercise;Therapeutic activities;Patient/family education;Gait  training;Manual techniques;Passive range of motion;Vasopneumatic Device;Dry needling   PT Next Visit Plan continue strengthening activities per tolerance; progress AROM   Consulted and Agree with Plan of Care Patient      Patient will benefit from skilled therapeutic intervention in order to improve the following deficits and impairments:  Pain, Impaired UE functional use, Decreased strength, Increased edema, Decreased balance, Decreased range of motion,  Postural dysfunction  Visit Diagnosis: Acute pain of right shoulder - Plan: PT plan of care cert/re-cert  Stiffness of right shoulder, not elsewhere classified - Plan: PT plan of care cert/re-cert  Abnormal posture - Plan: PT plan of care cert/re-cert  Muscle weakness (generalized) - Plan: PT plan of care cert/re-cert     Problem List Patient Active Problem List   Diagnosis Date Noted  . 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  [place], [place], [address] Phone: [phone]   Fax:  [phone]  Name: NAHEIM BURGEN MRN: [id number] Date of Birth: 07-Mar-1946

## 2016-08-18 ENCOUNTER — Ambulatory Visit: Payer: Medicare Other | Attending: Orthopedic Surgery

## 2016-08-18 DIAGNOSIS — M6281 Muscle weakness (generalized): Secondary | ICD-10-CM | POA: Diagnosis present

## 2016-08-18 DIAGNOSIS — M25611 Stiffness of right shoulder, not elsewhere classified: Secondary | ICD-10-CM | POA: Insufficient documentation

## 2016-08-18 DIAGNOSIS — M25511 Pain in right shoulder: Secondary | ICD-10-CM | POA: Insufficient documentation

## 2016-08-18 DIAGNOSIS — R293 Abnormal posture: Secondary | ICD-10-CM | POA: Insufficient documentation

## 2016-08-18 NOTE — Therapy (Signed)
[facility] [address]  [place], [place], [address] Phone: [phone]   Fax:  [phone]  Physical Therapy Treatment  Patient Details  Name: [name] Soto MRN: [id number] Date of Birth: Jul 21, 1946 Referring Provider: Dr. Justice Britain  Encounter Date: 08/18/2016      PT End of Session - 08/18/16 1412    Visit Number 17   Number of Visits 32   Date for PT Re-Evaluation 10/08/16   PT Start Time 1400   PT Stop Time 1440   PT Time Calculation (min) 40 min   Activity Tolerance Patient tolerated treatment well   Behavior During Therapy [facility] for tasks assessed/performed      Past Medical History:  Diagnosis Date  . Cerebral thrombosis with cerebral infarction ([place])   . Diabetes mellitus without complication ([place])   . GERD (gastroesophageal reflux disease)   . Humerus fracture    PROXIMAL RIGHT  . Hypertension   . Pancreatic lesion   . Stroke [facility])     Past Surgical History:  Procedure Laterality Date  . CATARACT EXTRACTION     RIGHT  . EUS N/A 07/17/2015   Procedure: ESOPHAGEAL ENDOSCOPIC ULTRASOUND (EUS) RADIAL;  Surgeon: Arta Silence, MD;  [facility];  Service: Endoscopy;  Laterality: N/A;  . FINE NEEDLE ASPIRATION N/A 07/17/2015   Procedure: FINE NEEDLE ASPIRATION (FNA) RADIAL;  Surgeon: Arta Silence, MD;  [facility];  Service: Endoscopy;  Laterality: N/A;  . HEMATOMA DRAINED    . HERNIA REPAIR     inguinal right  . HYDROCELE EXCISION    . REVERSE SHOULDER ARTHROPLASTY Right 05/07/2016   Procedure: RIGHT REVERSE TOTAL SHOULDER ARTHROPLASTY;  Surgeon: Justice Britain, MD;  Location: [facility];  Service: Orthopedics;  Laterality: Right;    There were no vitals filed for this visit.      Subjective Assessment - 08/18/16 1404    Subjective Pt. reporting MD f/u went well without changes.     Patient Stated Goals improve use of R arm   Currently in Pain? No/denies   Pain Score 0-No  pain   Multiple Pain Sites No            [facility] PT Assessment - 08/18/16 1413      Assessment   Medical Diagnosis R reverse total shoulder   Referring Provider Dr. Justice Britain   Next MD Visit ~ 09/17/15              [facility] Adult PT Treatment/Exercise - 08/18/16 1419      Shoulder Exercises: Seated   Other Seated Exercises seated flexion, scaption (no weight) above shoulder height to position of tightness x 15 reps each way; tactile cues provided througout to prevent trunk lateral or extension lean and excessive R scapular elevation     Shoulder Exercises: Standing   Protraction 15 reps   Theraband Level (Shoulder Protraction) Level 4 (Blue)   Internal Rotation Right;Theraband;20 reps   Theraband Level (Shoulder Internal Rotation) Level 4 (Blue)   Flexion 10 reps;AROM;Both   Shoulder Flexion Weight (lbs) 1   ABduction AROM;10 reps;Both  scaption   Shoulder ABduction Weight (lbs) 1   Extension Both;Theraband;20 reps   Theraband Level (Shoulder Extension) Level 3 (Green)   Extension Limitations 1 sets    Row Both;15 reps;Theraband   Theraband Level (Shoulder Row) Level 4 (Blue)   Row Limitations 1 set      Shoulder Exercises: Pulleys   Flexion 3 minutes  ABduction 3 minutes   ABduction Limitations more scaption than true abduction                  PT Short Term Goals - 08/13/16 1159      PT SHORT TERM GOAL #1   Title independent with initial HEP (07/21/16)   Time 4   Period Weeks   Status Achieved     PT SHORT TERM GOAL #2   Title improve R shoulder PROM by 15 degrees all motions for improved mobility (07/21/16)   Time 4   Period Weeks   Status Achieved           PT Long Term Goals - 08/13/16 1201      PT LONG TERM GOAL #1   Title independent with advanced HEP (10/08/16)   Time 8   Period Weeks   Status On-going     PT LONG TERM GOAL #2   Title demonstrate R shoulder AROM flexion 110 degrees; abduction 90 degrees and internal rotation 50  degrees for improved function (10/08/16)   Time 8   Period Weeks   Status On-going     PT LONG TERM GOAL #3   Title demonstrate 4/5 strength in abduction, flexion and internal rotation for improved R shoulder function (10/08/16)   Time 8   Period Weeks   Status Partially Met     PT LONG TERM GOAL #4   Title independent with ADLs without increase in pain (10/08/16)   Time 8   Period Weeks   Status On-going  08/11/16: still pain with putting on seat belt in car                Plan - 08/18/16 1520    Clinical Impression Statement Pt. tolerated increased resistance with scaption/flexion strengthening activities today.  Flexion/scaption performed with 1# dumbbells without substitutions today.  Pt. still requiring frequent cues to prevent trunk rotation with therex however not requiring as frequent cueing to avoid excessive scapular elevation.  Pt. progressing well only reporting R shoulder fatigue following therex today.   PT Treatment/Interventions ADLs/Self Care Home Management;Cryotherapy;Electrical Stimulation;Moist Heat;Balance training;Therapeutic exercise;Therapeutic activities;Patient/family education;Gait training;Manual techniques;Passive range of motion;Vasopneumatic Device;Dry needling   PT Next Visit Plan continue strengthening activities per tolerance; progress AROM      Patient will benefit from skilled therapeutic intervention in order to improve the following deficits and impairments:  Pain, Impaired UE functional use, Decreased strength, Increased edema, Decreased balance, Decreased range of motion, Postural dysfunction  Visit Diagnosis: Acute pain of right shoulder  Stiffness of right shoulder, not elsewhere classified  Abnormal posture  Muscle weakness (generalized)     Problem List Patient Active Problem List   Diagnosis Date Noted  . 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  [place], [place], [address] Phone: [phone]   Fax:  [phone]  Name: [name] Soto MRN: [id number] Date of Birth: 06-06-1946

## 2016-08-20 ENCOUNTER — Ambulatory Visit: Payer: Medicare Other | Admitting: Physical Therapy

## 2016-08-20 DIAGNOSIS — M6281 Muscle weakness (generalized): Secondary | ICD-10-CM

## 2016-08-20 DIAGNOSIS — M25511 Pain in right shoulder: Secondary | ICD-10-CM

## 2016-08-20 DIAGNOSIS — M25611 Stiffness of right shoulder, not elsewhere classified: Secondary | ICD-10-CM

## 2016-08-20 DIAGNOSIS — R293 Abnormal posture: Secondary | ICD-10-CM

## 2016-08-20 NOTE — Therapy (Signed)
[facility] [address]  [place], [place], [address] Phone: [phone]   Fax:  [phone]  Physical Therapy Treatment  Patient Details  Name: [name] Soto MRN: [id number] Date of Birth: 06-11-1946 Referring Provider: Dr. Justice Britain  Encounter Date: 08/20/2016      PT End of Session - 08/20/16 0927    Visit Number 18   Number of Visits 32   Date for PT Re-Evaluation 10/08/16   PT Start Time 0846   PT Stop Time 0926   PT Time Calculation (min) 40 min   Activity Tolerance Patient tolerated treatment well      Past Medical History:  Diagnosis Date  . Cerebral thrombosis with cerebral infarction ([place])   . Diabetes mellitus without complication ([place])   . GERD (gastroesophageal reflux disease)   . Humerus fracture    PROXIMAL RIGHT  . Hypertension   . Pancreatic lesion   . Stroke [facility])     Past Surgical History:  Procedure Laterality Date  . CATARACT EXTRACTION     RIGHT  . EUS N/A 07/17/2015   Procedure: ESOPHAGEAL ENDOSCOPIC ULTRASOUND (EUS) RADIAL;  Surgeon: Arta Silence, MD;  Location: [facility];  Service: Endoscopy;  Laterality: N/A;  . FINE NEEDLE ASPIRATION N/A 07/17/2015   Procedure: FINE NEEDLE ASPIRATION (FNA) RADIAL;  Surgeon: Arta Silence, MD;  [facility];  Service: Endoscopy;  Laterality: N/A;  . HEMATOMA DRAINED    . HERNIA REPAIR     inguinal right  . HYDROCELE EXCISION    . REVERSE SHOULDER ARTHROPLASTY Right 05/07/2016   Procedure: RIGHT REVERSE TOTAL SHOULDER ARTHROPLASTY;  Surgeon: Justice Britain, MD;  Location: [place];  Service: Orthopedics;  Laterality: Right;    There were no vitals filed for this visit.      Subjective Assessment - 08/20/16 0848    Subjective doing well, no complaints   Pertinent History CVA (residual L sided weakness), DM, HTN, pancreatic lesion   Limitations Lifting;House hold activities   Patient Stated Goals improve use of R arm   Currently in Pain? No/denies                         [facility] Adult PT Treatment/Exercise - 08/20/16 0849      Shoulder Exercises: Seated   Flexion AROM;AAROM;Right;10 reps   Flexion Limitations with 3 sec hold at end available range   Abduction AROM;AAROM;Right;10 reps   ABduction Limitations with 3 sec hold at end available range     Shoulder Exercises: Prone   Retraction Right;15 reps;Weights   Retraction Weight (lbs) 2   Retraction Limitations 5 sec hold   Extension Right;15 reps;Weights   Extension Weight (lbs) 2   Extension Limitations 3 sec scap hold   Horizontal ABduction 1 Right;15 reps   Horizontal ABduction 1 Limitations 5 sec hold     Shoulder Exercises: Pulleys   Flexion 3 minutes   ABduction 3 minutes   ABduction Limitations scaption     Shoulder Exercises: ROM/Strengthening   UBE (Upper Arm Bike) L2 x 6 minutes (3/3)     Manual Therapy   Manual Therapy Passive ROM   Passive ROM R shoulder flexion, abduction, ir/er to tolerance                  PT Short Term Goals - 08/13/16 1159      PT SHORT TERM GOAL #1   Title independent with initial HEP (07/21/16)  Time 4   Period Weeks   Status Achieved     PT SHORT TERM GOAL #2   Title improve R shoulder PROM by 15 degrees all motions for improved mobility (07/21/16)   Time 4   Period Weeks   Status Achieved           PT Long Term Goals - 08/13/16 1201      PT LONG TERM GOAL #1   Title independent with advanced HEP (10/08/16)   Time 8   Period Weeks   Status On-going     PT LONG TERM GOAL #2   Title demonstrate R shoulder AROM flexion 110 degrees; abduction 90 degrees and internal rotation 50 degrees for improved function (10/08/16)   Time 8   Period Weeks   Status On-going     PT LONG TERM GOAL #3   Title demonstrate 4/5 strength in abduction, flexion and internal rotation for improved R shoulder function (10/08/16)   Time 8   Period Weeks   Status Partially Met      PT LONG TERM GOAL #4   Title independent with ADLs without increase in pain (10/08/16)   Time 8   Period Weeks   Status On-going  08/11/16: still pain with putting on seat belt in car                Plan - 08/20/16 0927    Clinical Impression Statement Pt tolerated session well today with increased fatigue and expected pain response with ROM exercises.  Pt will continue to benefit from PT to maximize function.   PT Treatment/Interventions ADLs/Self Care Home Management;Cryotherapy;Electrical Stimulation;Moist Heat;Balance training;Therapeutic exercise;Therapeutic activities;Patient/family education;Gait training;Manual techniques;Passive range of motion;Vasopneumatic Device;Dry needling   PT Next Visit Plan continue strengthening activities per tolerance; progress AROM   Consulted and Agree with Plan of Care Patient      Patient will benefit from skilled therapeutic intervention in order to improve the following deficits and impairments:  Pain, Impaired UE functional use, Decreased strength, Increased edema, Decreased balance, Decreased range of motion, Postural dysfunction  Visit Diagnosis: Acute pain of right shoulder  Stiffness of right shoulder, not elsewhere classified  Abnormal posture  Muscle weakness (generalized)     Problem List Patient Active Problem List   Diagnosis Date Noted  . 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  [place], [place], [address] Phone: [phone]   Fax:  [phone]  Name: [name] Soto MRN: [id number] Date of Birth: 1946-09-06

## 2016-08-25 ENCOUNTER — Ambulatory Visit: Payer: Medicare Other | Admitting: Physical Therapy

## 2016-08-25 DIAGNOSIS — R293 Abnormal posture: Secondary | ICD-10-CM

## 2016-08-25 DIAGNOSIS — M25611 Stiffness of right shoulder, not elsewhere classified: Secondary | ICD-10-CM

## 2016-08-25 DIAGNOSIS — M25511 Pain in right shoulder: Secondary | ICD-10-CM

## 2016-08-25 DIAGNOSIS — M6281 Muscle weakness (generalized): Secondary | ICD-10-CM

## 2016-08-25 NOTE — Therapy (Signed)
[facility] [address]  [place], [place], [address] Phone: [phone]   Fax:  [phone]  Physical Therapy Treatment  Patient Details  Name: [name] Soto MRN: [id number] Date of Birth: Nov 02, 1945 Referring Provider: Dr. Justice Britain  Encounter Date: 08/25/2016      PT End of Session - 08/25/16 0851    Visit Number 19   Number of Visits 32   Date for PT Re-Evaluation 10/08/16   PT Start Time 0846   PT Stop Time 0928   PT Time Calculation (min) 42 min   Activity Tolerance Patient tolerated treatment well   Behavior During Therapy [facility] for tasks assessed/performed      Past Medical History:  Diagnosis Date  . Cerebral thrombosis with cerebral infarction ([place])   . Diabetes mellitus without complication ([place])   . GERD (gastroesophageal reflux disease)   . Humerus fracture    PROXIMAL RIGHT  . Hypertension   . Pancreatic lesion   . Stroke [facility])     Past Surgical History:  Procedure Laterality Date  . CATARACT EXTRACTION     RIGHT  . EUS N/A 07/17/2015   Procedure: ESOPHAGEAL ENDOSCOPIC ULTRASOUND (EUS) RADIAL;  Surgeon: Arta Silence, MD;  [facility];  Service: Endoscopy;  Laterality: N/A;  . FINE NEEDLE ASPIRATION N/A 07/17/2015   Procedure: FINE NEEDLE ASPIRATION (FNA) RADIAL;  Surgeon: Arta Silence, MD;  [facility];  Service: Endoscopy;  Laterality: N/A;  . HEMATOMA DRAINED    . HERNIA REPAIR     inguinal right  . HYDROCELE EXCISION    . REVERSE SHOULDER ARTHROPLASTY Right 05/07/2016   Procedure: RIGHT REVERSE TOTAL SHOULDER ARTHROPLASTY;  Surgeon: Justice Britain, MD;  Location: [place];  Service: Orthopedics;  Laterality: Right;    There were no vitals filed for this visit.      Subjective Assessment - 08/25/16 0850    Subjective patient reporting soreness in R shoulder   Pertinent History CVA (residual L sided weakness), DM, HTN, pancreatic lesion   Patient Stated  Goals improve use of R arm   Currently in Pain? Yes   Pain Score 2    Pain Location Shoulder   Pain Orientation Right   Pain Descriptors / Indicators Sore                         [facility] Adult PT Treatment/Exercise - 08/25/16 0853      Shoulder Exercises: Seated   Flexion AROM;AAROM;Right;10 reps   Flexion Limitations with 3 sec hold at end available range; eccentric lowering   Abduction AROM;AAROM;Right;10 reps   ABduction Limitations with 3 sec hold at end available range; with eccentric lowering      Shoulder Exercises: Prone   Retraction Right;15 reps;Weights   Retraction Weight (lbs) 2   Retraction Limitations 3-5 second hold   Extension Right;15 reps;Weights   Extension Weight (lbs) 2   Extension Limitations 3 sec scap hold   Horizontal ABduction 1 Right;15 reps   Horizontal ABduction 1 Limitations 5 sec hold; reduced ROM     Shoulder Exercises: Sidelying   External Rotation Right;15 reps;Weights   External Rotation Weight (lbs) 2   External Rotation Limitations 2 sets     Shoulder Exercises: Pulleys   Flexion 3 minutes   ABduction 3 minutes   ABduction Limitations scaption     Shoulder Exercises: ROM/Strengthening   UBE (Upper Arm Bike) Level 2.5 x 6 minutes (3/3)  PT Short Term Goals - 08/13/16 1159      PT SHORT TERM GOAL #1   Title independent with initial HEP (07/21/16)   Time 4   Period Weeks   Status Achieved     PT SHORT TERM GOAL #2   Title improve R shoulder PROM by 15 degrees all motions for improved mobility (07/21/16)   Time 4   Period Weeks   Status Achieved           PT Long Term Goals - 08/13/16 1201      PT LONG TERM GOAL #1   Title independent with advanced HEP (10/08/16)   Time 8   Period Weeks   Status On-going     PT LONG TERM GOAL #2   Title demonstrate R shoulder AROM flexion 110 degrees; abduction 90 degrees and internal rotation 50 degrees for improved function (10/08/16)   Time 8    Period Weeks   Status On-going     PT LONG TERM GOAL #3   Title demonstrate 4/5 strength in abduction, flexion and internal rotation for improved R shoulder function (10/08/16)   Time 8   Period Weeks   Status Partially Met     PT LONG TERM GOAL #4   Title independent with ADLs without increase in pain (10/08/16)   Time 8   Period Weeks   Status On-going  08/11/16: still pain with putting on seat belt in car                Plan - 08/25/16 0934    Clinical Impression Statement patient today with some residual soreness following last weeks treatement. Patient today tolerating all strengthening exercises to improve ROM, RTC strength and periscapular strength with no increase in pain, however, with appropriate muscle fatigue. Patient to continue to benefit from PT to maximize functional use of R UE.    PT Treatment/Interventions ADLs/Self Care Home Management;Cryotherapy;Electrical Stimulation;Moist Heat;Balance training;Therapeutic exercise;Therapeutic activities;Patient/family education;Gait training;Manual techniques;Passive range of motion;Vasopneumatic Device;Dry needling   PT Next Visit Plan continue strengthening activities per tolerance; progress AROM   Consulted and Agree with Plan of Care Patient      Patient will benefit from skilled therapeutic intervention in order to improve the following deficits and impairments:  Pain, Impaired UE functional use, Decreased strength, Increased edema, Decreased balance, Decreased range of motion, Postural dysfunction  Visit Diagnosis: Acute pain of right shoulder  Stiffness of right shoulder, not elsewhere classified  Abnormal posture  Muscle weakness (generalized)     Problem List Patient Active Problem List   Diagnosis Date Noted  . 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  [place], [place], [address] Phone: [phone]   Fax:  [phone]  Name: [name] Soto MRN: [id number] Date of Birth: 08-May-1946

## 2016-08-27 ENCOUNTER — Ambulatory Visit: Payer: Medicare Other

## 2016-08-27 DIAGNOSIS — M25611 Stiffness of right shoulder, not elsewhere classified: Secondary | ICD-10-CM

## 2016-08-27 DIAGNOSIS — M6281 Muscle weakness (generalized): Secondary | ICD-10-CM

## 2016-08-27 DIAGNOSIS — M25511 Pain in right shoulder: Secondary | ICD-10-CM | POA: Diagnosis not present

## 2016-08-27 DIAGNOSIS — R293 Abnormal posture: Secondary | ICD-10-CM

## 2016-08-27 NOTE — Therapy (Signed)
[facility] [address]  [place], [place], [address] Phone: [phone]   Fax:  [phone]  Physical Therapy Treatment  Patient Details  Name: [name] Soto MRN: [id number] Date of Birth: 07-Mar-1946 Referring Provider: Dr. Justice Britain  Encounter Date: 08/27/2016      PT End of Session - 08/27/16 0943    Visit Number 20   Number of Visits 32   Date for PT Re-Evaluation 10/08/16   PT Start Time 0936   PT Stop Time 1015   PT Time Calculation (min) 39 min   Activity Tolerance Patient tolerated treatment well   Behavior During Therapy [facility] for tasks assessed/performed      Past Medical History:  Diagnosis Date  . Cerebral thrombosis with cerebral infarction ([place])   . Diabetes mellitus without complication ([place])   . GERD (gastroesophageal reflux disease)   . Humerus fracture    PROXIMAL RIGHT  . Hypertension   . Pancreatic lesion   . Stroke [facility])     Past Surgical History:  Procedure Laterality Date  . CATARACT EXTRACTION     RIGHT  . EUS N/A 07/17/2015   Procedure: ESOPHAGEAL ENDOSCOPIC ULTRASOUND (EUS) RADIAL;  Surgeon: Arta Silence, MD;  [facility];  Service: Endoscopy;  Laterality: N/A;  . FINE NEEDLE ASPIRATION N/A 07/17/2015   Procedure: FINE NEEDLE ASPIRATION (FNA) RADIAL;  Surgeon: Arta Silence, MD;  [facility];  Service: Endoscopy;  Laterality: N/A;  . HEMATOMA DRAINED    . HERNIA REPAIR     inguinal right  . HYDROCELE EXCISION    . REVERSE SHOULDER ARTHROPLASTY Right 05/07/2016   Procedure: RIGHT REVERSE TOTAL SHOULDER ARTHROPLASTY;  Surgeon: Justice Britain, MD;  Location: [place];  Service: Orthopedics;  Laterality: Right;    There were no vitals filed for this visit.      Subjective Assessment - 08/27/16 0941    Subjective Pt. reporting he feels good today no soreness.    Patient Stated Goals improve use of R arm   Currently in Pain? No/denies   Pain Score 0-No  pain   Multiple Pain Sites No            [facility] PT Assessment - 08/27/16 0942      Observation/Other Assessments   Focus on Therapeutic Outcomes (FOTO)  59% status (41% limitation)     AROM   Right/Left Shoulder Right   Right Shoulder Flexion 101 Degrees   Right Shoulder ABduction 84 Degrees   Right Shoulder Internal Rotation 33 Degrees   Right Shoulder External Rotation 36 Degrees     Strength   Right/Left Shoulder Right   Right Shoulder Flexion 4/5   Right Shoulder ABduction 4/5   Right Shoulder Internal Rotation 4/5  Pt. reporting 1/10 pain with this   Right Shoulder External Rotation 4-/5           [facility] Adult PT Treatment/Exercise - 08/27/16 1006      Shoulder Exercises: Standing   Flexion 15 reps;AROM;Both  2 sets    Shoulder Flexion Weight (lbs) 1   ABduction AROM;Both;15 reps   Shoulder ABduction Weight (lbs) 1   Row Both;15 reps;Theraband  5 sec hold    Theraband Level (Shoulder Row) Level 4 (Blue)                  PT Short Term Goals - 08/13/16 1159      PT SHORT TERM GOAL #1   Title independent with  initial HEP (07/21/16)   Time 4   Period Weeks   Status Achieved     PT SHORT TERM GOAL #2   Title improve R shoulder PROM by 15 degrees all motions for improved mobility (07/21/16)   Time 4   Period Weeks   Status Achieved           PT Long Term Goals - Sep 16, 2016 0944      PT LONG TERM GOAL #1   Title independent with advanced HEP (10/08/16)   Time 8   Period Weeks   Status Partially Met  2016/09/16: pt. admitting to poor adherence to HEP at this point, "getting to some of the activities every couple of days".      PT LONG TERM GOAL #2   Title demonstrate R shoulder AROM flexion 110 degrees; abduction 90 degrees and internal rotation 50 degrees for improved function (10/08/16)   Time 8   Period Weeks   Status On-going  Sep 16, 2016: Pt. able to demo R shoulder AROM flexion 101 dg, abd 84 dg, IR 33 dg, ER 36 dg     PT LONG TERM GOAL  #3   Title demonstrate 4/5 strength in abduction, flexion and internal rotation for improved R shoulder function (10/08/16)   Time 8   Period Weeks   Status Achieved  09-16-2016: achieved however pt. reporting 1/10 pain with IR strength testing     PT LONG TERM GOAL #4   Title independent with ADLs without increase in pain (10/08/16)   Time 8   Period Weeks   Status Achieved  09-16-16: Pt. reporting he is pain free with all ADL however still with difficulty and pain getting up and down from floor.                 Plan - 09-16-16 1010    Clinical Impression Statement Pt. able to meet 4/5 strength goal in R shoulder flex, abduction, and IR today however still with 1/10 pain with IR.  Pt. reporting he is now pain free with all ADL however still has mild R shoulder pain while getting up and down from floor.  Pt. still with significant gap between AROM and PROM at R shoulder and demonstrating significant weakness and poor endurance with shoulder height strengthening activities.  When asked about adherence to HEP pt. reporting he, "gets to some of the activities every few days".  Pt. instructed to consistently adhere to HEP to maximize functional strength improvement with therapy.  Pt. would benefit from further skilled therapy to maximize functional strength and further instruction with HEP to improve overhead strength and ROM.      PT Treatment/Interventions ADLs/Self Care Home Management;Cryotherapy;Electrical Stimulation;Moist Heat;Balance training;Therapeutic exercise;Therapeutic activities;Patient/family education;Gait training;Manual techniques;Passive range of motion;Vasopneumatic Device;Dry needling   PT Next Visit Plan continue strengthening activities per tolerance; progress AROM      Patient will benefit from skilled therapeutic intervention in order to improve the following deficits and impairments:  Pain, Impaired UE functional use, Decreased strength, Increased edema, Decreased  balance, Decreased range of motion, Postural dysfunction  Visit Diagnosis: Acute pain of right shoulder  Stiffness of right shoulder, not elsewhere classified  Abnormal posture  Muscle weakness (generalized)       G-Codes - Sep 16, 2016 1704    Functional Assessment Tool Used FOTO: 59% (41% limited)   Functional Limitation Carrying, moving and handling objects   Carrying, Moving and Handling Objects Current Status ([id number]) At least 40 percent but less than 60 percent impaired, limited  or restricted   Carrying, Moving and Handling Objects Goal Status [phone]) At least 20 percent but less than 40 percent impaired, limited or restricted      Problem List Patient Active Problem List   Diagnosis Date Noted  . 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   Reason Skilled Services are Required: patient continues to lack AROM well above shoulder height with heavy compensation observed during all active movements requiring VC and TC needed to reduce compensation. Patient reporting difficulty with work duties as well as driving with inability to fully turn wheel with effected shoulder. Patient to require continued work with comprehensive HEP as well as overall adherence to program.     Lanney Gins, PT, DPT 08/27/16 6:00 PM    [facility] [place], [place], [address] Phone: [phone]   Fax:   [phone]  Name: [name] Soto MRN: [id number] Date of Birth: Jan 26, 1946

## 2016-09-01 ENCOUNTER — Ambulatory Visit: Payer: Medicare Other

## 2016-09-01 DIAGNOSIS — M25511 Pain in right shoulder: Secondary | ICD-10-CM

## 2016-09-01 DIAGNOSIS — M25611 Stiffness of right shoulder, not elsewhere classified: Secondary | ICD-10-CM

## 2016-09-01 DIAGNOSIS — M6281 Muscle weakness (generalized): Secondary | ICD-10-CM

## 2016-09-01 DIAGNOSIS — R293 Abnormal posture: Secondary | ICD-10-CM

## 2016-09-01 NOTE — Therapy (Signed)
[facility] [address]  [place], [place], [address] Phone: [phone]   Fax:  [phone]  Physical Therapy Treatment  Patient Details  Name: [name] Soto MRN: [id number] Date of Birth: Jul 28, 1946 Referring Provider: Dr. Justice Britain  Encounter Date: 09/01/2016      PT End of Session - 09/01/16 0918    Visit Number 21   Number of Visits 32   Date for PT Re-Evaluation 10/08/16   PT Start Time 0849   PT Stop Time 0930   PT Time Calculation (min) 41 min   Activity Tolerance Patient tolerated treatment well   Behavior During Therapy [facility] for tasks assessed/performed      Past Medical History:  Diagnosis Date  . Cerebral thrombosis with cerebral infarction ([place])   . Diabetes mellitus without complication ([place])   . GERD (gastroesophageal reflux disease)   . Humerus fracture    PROXIMAL RIGHT  . Hypertension   . Pancreatic lesion   . Stroke [facility])     Past Surgical History:  Procedure Laterality Date  . CATARACT EXTRACTION     RIGHT  . EUS N/A 07/17/2015   Procedure: ESOPHAGEAL ENDOSCOPIC ULTRASOUND (EUS) RADIAL;  Surgeon: Arta Silence, MD;  [facility];  Service: Endoscopy;  Laterality: N/A;  . FINE NEEDLE ASPIRATION N/A 07/17/2015   Procedure: FINE NEEDLE ASPIRATION (FNA) RADIAL;  Surgeon: Arta Silence, MD;  [facility];  Service: Endoscopy;  Laterality: N/A;  . HEMATOMA DRAINED    . HERNIA REPAIR     inguinal right  . HYDROCELE EXCISION    . REVERSE SHOULDER ARTHROPLASTY Right 05/07/2016   Procedure: RIGHT REVERSE TOTAL SHOULDER ARTHROPLASTY;  Surgeon: Justice Britain, MD;  Location: [place];  Service: Orthopedics;  Laterality: Right;    There were no vitals filed for this visit.      Subjective Assessment - 09/01/16 0852    Subjective Pt. reporting his shoulder has been, "feeling looser".     Patient Stated Goals improve use of R arm   Currently in Pain? No/denies   Pain  Score 0-No pain   Multiple Pain Sites No              [facility] Adult PT Treatment/Exercise - 09/01/16 0923      Shoulder Exercises: Prone   Retraction Right;15 reps;Weights   Retraction Weight (lbs) 3   Retraction Limitations 3-5 second hold   Extension Right;15 reps;Weights   Extension Weight (lbs) 3   Extension Limitations 3 sec scap hold   Horizontal ABduction 1 Right;15 reps   Horizontal ABduction 1 Limitations 5 sec hold; reduced ROM     Shoulder Exercises: Standing   Internal Rotation Right;Theraband;20 reps  slow eccentric; 2 sets    Theraband Level (Shoulder Internal Rotation) Level 4 (Blue)   Flexion 15 reps;AROM;Both  2 sets    Shoulder Flexion Weight (lbs) 1   ABduction AROM;Both;15 reps  2 sets    Shoulder ABduction Weight (lbs) 1     Shoulder Exercises: ROM/Strengthening   UBE (Upper Arm Bike) Level 4.0 x 6 minutes (3/3)   Cybex Row 15 reps  5 sec    Cybex Row Limitations 25#                  PT Short Term Goals - 08/13/16 1159      PT SHORT TERM GOAL #1   Title independent with initial HEP (07/21/16)   Time 4   Period Weeks  Status Achieved     PT SHORT TERM GOAL #2   Title improve R shoulder PROM by 15 degrees all motions for improved mobility (07/21/16)   Time 4   Period Weeks   Status Achieved           PT Long Term Goals - 08/27/16 0944      PT LONG TERM GOAL #1   Title independent with advanced HEP (10/08/16)   Time 8   Period Weeks   Status Partially Met  12.14.17: pt. admitting to poor adherence to HEP at this point, "getting to some of the activities every couple of days".      PT LONG TERM GOAL #2   Title demonstrate R shoulder AROM flexion 110 degrees; abduction 90 degrees and internal rotation 50 degrees for improved function (10/08/16)   Time 8   Period Weeks   Status On-going  12.14.17: Pt. able to demo R shoulder AROM flexion 101 dg, abd 84 dg, IR 33 dg, ER 36 dg     PT LONG TERM GOAL #3   Title demonstrate  4/5 strength in abduction, flexion and internal rotation for improved R shoulder function (10/08/16)   Time 8   Period Weeks   Status Achieved  12.14.17: achieved however pt. reporting 1/10 pain with IR strength testing     PT LONG TERM GOAL #4   Title independent with ADLs without increase in pain (10/08/16)   Time 8   Period Weeks   Status Achieved  12.14.17: Pt. reporting he is pain free with all ADL however still with difficulty and pain getting up and down from floor.                 Plan - 09/01/16 0919    Clinical Impression Statement Pt. pain free initially today with only R shoulder discomfort reported with therex.  Pt. tolerated increased repetitions with flexion/scaption activities today and progressed in prone scapular strengthening weight to 3#.  Overhead strengthening activities still very limited by R shoulder ROM at this point.  Pt. unable to perform elevated R shoulder IR with TB today when attempted due to tightness in shoulder.  Will plan to continue elevated shoulder strengthening activities and advancement of scapular strengthening in coming visits.    PT Treatment/Interventions ADLs/Self Care Home Management;Cryotherapy;Electrical Stimulation;Moist Heat;Balance training;Therapeutic exercise;Therapeutic activities;Patient/family education;Gait training;Manual techniques;Passive range of motion;Vasopneumatic Device;Dry needling   PT Next Visit Plan continue strengthening activities per tolerance; progress AROM      Patient will benefit from skilled therapeutic intervention in order to improve the following deficits and impairments:  Pain, Impaired UE functional use, Decreased strength, Increased edema, Decreased balance, Decreased range of motion, Postural dysfunction  Visit Diagnosis: Acute pain of right shoulder  Stiffness of right shoulder, not elsewhere classified  Abnormal posture  Muscle weakness (generalized)     Problem List Patient Active Problem  List   Diagnosis Date Noted  . 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  [place], [place], [address] Phone: [phone]   Fax:  [phone]  Name: [name] Soto MRN: [id number] Date of Birth: 05-27-1946

## 2016-09-03 ENCOUNTER — Ambulatory Visit: Payer: Medicare Other

## 2016-09-03 DIAGNOSIS — M25611 Stiffness of right shoulder, not elsewhere classified: Secondary | ICD-10-CM

## 2016-09-03 DIAGNOSIS — M6281 Muscle weakness (generalized): Secondary | ICD-10-CM

## 2016-09-03 DIAGNOSIS — M25511 Pain in right shoulder: Secondary | ICD-10-CM

## 2016-09-03 DIAGNOSIS — R293 Abnormal posture: Secondary | ICD-10-CM

## 2016-09-03 NOTE — Therapy (Signed)
[facility] [address]  [place], [place], [address] Phone: [phone]   Fax:  [phone]  Physical Therapy Treatment  Patient Details  Name: [name] Soto MRN: [id number] Date of Birth: May 17, 1946 Referring Provider: Dr. Justice Britain  Encounter Date: 09/03/2016      PT End of Session - 09/03/16 0853    Visit Number 22   Number of Visits 32   Date for PT Re-Evaluation 10/08/16   PT Start Time 0850   PT Stop Time 0930   PT Time Calculation (min) 40 min   Activity Tolerance Patient tolerated treatment well   Behavior During Therapy [facility] for tasks assessed/performed      Past Medical History:  Diagnosis Date  . Cerebral thrombosis with cerebral infarction ([place])   . Diabetes mellitus without complication ([place])   . GERD (gastroesophageal reflux disease)   . Humerus fracture    PROXIMAL RIGHT  . Hypertension   . Pancreatic lesion   . Stroke [facility])     Past Surgical History:  Procedure Laterality Date  . CATARACT EXTRACTION     RIGHT  . EUS N/A 07/17/2015   Procedure: ESOPHAGEAL ENDOSCOPIC ULTRASOUND (EUS) RADIAL;  Surgeon: Arta Silence, MD;  [facility];  Service: Endoscopy;  Laterality: N/A;  . FINE NEEDLE ASPIRATION N/A 07/17/2015   Procedure: FINE NEEDLE ASPIRATION (FNA) RADIAL;  Surgeon: Arta Silence, MD;  [facility];  Service: Endoscopy;  Laterality: N/A;  . HEMATOMA DRAINED    . HERNIA REPAIR     inguinal right  . HYDROCELE EXCISION    . REVERSE SHOULDER ARTHROPLASTY Right 05/07/2016   Procedure: RIGHT REVERSE TOTAL SHOULDER ARTHROPLASTY;  Surgeon: Justice Britain, MD;  Location: [place];  Service: Orthopedics;  Laterality: Right;    There were no vitals filed for this visit.      Subjective Assessment - 09/03/16 0851    Subjective Pt. reporting his shoulder has been sore since last treatment however no, "serious pain".     Patient Stated Goals improve use of R arm   Currently in Pain? No/denies   Pain Score 0-No pain   Multiple Pain Sites No           [facility] Adult PT Treatment/Exercise - 09/03/16 0858      Shoulder Exercises: Prone   Retraction Weight (lbs) 3   Retraction Limitations 3-5 second hold   Extension Right;15 reps;Weights   Extension Weight (lbs) 3   Horizontal ABduction 1 Right;15 reps   Horizontal ABduction 1 Limitations 5 sec hold; reduced ROM     Shoulder Exercises: Standing   Flexion 15 reps;AROM;Both  2 sets    Shoulder Flexion Weight (lbs) 2   ABduction AROM;Both;15 reps  2 sets    Shoulder ABduction Weight (lbs) 2   Row Both;15 reps;Theraband  with elevated elbow position V-row   Theraband Level (Shoulder Row) Level 4 (Blue)     Shoulder Exercises: ROM/Strengthening   UBE (Upper Arm Bike) Level 4.0 x 6 minutes (3/3)   Cybex Row 20 reps  5" hold   Cybex Row Limitations 25#     Manual Therapy   Manual Therapy Passive ROM   Passive ROM R shoulder flexion, abduction, ir/er to tolerance                  PT Short Term Goals - 08/13/16 1159      PT SHORT TERM GOAL #1   Title independent with initial HEP (  07/21/16)   Time 4   Period Weeks   Status Achieved     PT SHORT TERM GOAL #2   Title improve R shoulder PROM by 15 degrees all motions for improved mobility (07/21/16)   Time 4   Period Weeks   Status Achieved           PT Long Term Goals - 08/27/16 0944      PT LONG TERM GOAL #1   Title independent with advanced HEP (10/08/16)   Time 8   Period Weeks   Status Partially Met  12.14.17: pt. admitting to poor adherence to HEP at this point, "getting to some of the activities every couple of days".      PT LONG TERM GOAL #2   Title demonstrate R shoulder AROM flexion 110 degrees; abduction 90 degrees and internal rotation 50 degrees for improved function (10/08/16)   Time 8   Period Weeks   Status On-going  12.14.17: Pt. able to demo R shoulder AROM flexion 101 dg, abd 84 dg, IR 33 dg, ER 36  dg     PT LONG TERM GOAL #3   Title demonstrate 4/5 strength in abduction, flexion and internal rotation for improved R shoulder function (10/08/16)   Time 8   Period Weeks   Status Achieved  12.14.17: achieved however pt. reporting 1/10 pain with IR strength testing     PT LONG TERM GOAL #4   Title independent with ADLs without increase in pain (10/08/16)   Time 8   Period Weeks   Status Achieved  12.14.17: Pt. reporting he is pain free with all ADL however still with difficulty and pain getting up and down from floor.                 Plan - 09/03/16 0854    Clinical Impression Statement Pt. reporting mild soreness since last treatment this morning.  Pt. able to progress weight with flexion, scaption motions today without signs of substitutions.  Pt. will requiring occasional VC's to avoid scap. elevation and trunk motion with retraction activities however this is less frequent now.  Pt. progressing well at this point pain free with treatment.     PT Treatment/Interventions ADLs/Self Care Home Management;Cryotherapy;Electrical Stimulation;Moist Heat;Balance training;Therapeutic exercise;Therapeutic activities;Patient/family education;Gait training;Manual techniques;Passive range of motion;Vasopneumatic Device;Dry needling   PT Next Visit Plan continue strengthening activities per tolerance; progress AROM      Patient will benefit from skilled therapeutic intervention in order to improve the following deficits and impairments:  Pain, Impaired UE functional use, Decreased strength, Increased edema, Decreased balance, Decreased range of motion, Postural dysfunction  Visit Diagnosis: Acute pain of right shoulder  Stiffness of right shoulder, not elsewhere classified  Abnormal posture  Muscle weakness (generalized)     Problem List Patient Active Problem List   Diagnosis Date Noted  . 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

## 2016-09-08 ENCOUNTER — Ambulatory Visit: Payer: Medicare Other

## 2016-09-08 DIAGNOSIS — R293 Abnormal posture: Secondary | ICD-10-CM

## 2016-09-08 DIAGNOSIS — M25611 Stiffness of right shoulder, not elsewhere classified: Secondary | ICD-10-CM

## 2016-09-08 DIAGNOSIS — M25511 Pain in right shoulder: Secondary | ICD-10-CM | POA: Diagnosis not present

## 2016-09-08 DIAGNOSIS — M6281 Muscle weakness (generalized): Secondary | ICD-10-CM

## 2016-09-08 NOTE — Therapy (Signed)
[name] [address]  [place], [place], [address] Phone: [phone]   Fax:  [phone]  Physical Therapy Treatment  Patient Details  Name: [name] Soto MRN: [id number] Date of Birth: Jan 21, 1946 Referring Provider: Dr. Justice Britain  Encounter Date: 09/08/2016      PT End of Session - 09/08/16 0901    Visit Number 23   Number of Visits 32   Date for PT Re-Evaluation 10/08/16   PT Start Time 0848   PT Stop Time 0928   PT Time Calculation (min) 40 min   Activity Tolerance Patient tolerated treatment well   Behavior During Therapy [facility] for tasks assessed/performed      Past Medical History:  Diagnosis Date  . Cerebral thrombosis with cerebral infarction ([place])   . Diabetes mellitus without complication ([place])   . GERD (gastroesophageal reflux disease)   . Humerus fracture    PROXIMAL RIGHT  . Hypertension   . Pancreatic lesion   . Stroke [facility])     Past Surgical History:  Procedure Laterality Date  . CATARACT EXTRACTION     RIGHT  . EUS N/A 07/17/2015   Procedure: ESOPHAGEAL ENDOSCOPIC ULTRASOUND (EUS) RADIAL;  Surgeon: Arta Silence, MD;  [facility];  Service: Endoscopy;  Laterality: N/A;  . FINE NEEDLE ASPIRATION N/A 07/17/2015   Procedure: FINE NEEDLE ASPIRATION (FNA) RADIAL;  Surgeon: Arta Silence, MD;  [facility];  Service: Endoscopy;  Laterality: N/A;  . HEMATOMA DRAINED    . HERNIA REPAIR     inguinal right  . HYDROCELE EXCISION    . REVERSE SHOULDER ARTHROPLASTY Right 05/07/2016   Procedure: RIGHT REVERSE TOTAL SHOULDER ARTHROPLASTY;  Surgeon: Justice Britain, MD;  Location: [place];  Service: Orthopedics;  Laterality: Right;    There were no vitals filed for this visit.      Subjective Assessment - 09/08/16 0907    Subjective Pt. reporting pain free today.     Patient Stated Goals improve use of R arm   Currently in Pain? No/denies   Pain Score 0-No pain   Multiple Pain Sites No             [facility] Adult PT Treatment/Exercise - 09/08/16 0859      Shoulder Exercises: Standing   Flexion AROM;Both;10 reps  2 sets    Shoulder Flexion Weight (lbs) 3   ABduction AROM;Both;10 reps  2 sets    Shoulder ABduction Weight (lbs) 3   Other Standing Exercises Standing R shoulder D1, D2 flexion/ext x 10 reps with red TB      Shoulder Exercises: ROM/Strengthening   UBE (Upper Arm Bike) Level 4.0 x 6 minutes (3/3)   Cybex Row 10 reps  5 sec hold; 2 sets    Cybex Row Limitations 35#                  PT Short Term Goals - 08/13/16 1159      PT SHORT TERM GOAL #1   Title independent with initial HEP (07/21/16)   Time 4   Period Weeks   Status Achieved     PT SHORT TERM GOAL #2   Title improve R shoulder PROM by 15 degrees all motions for improved mobility (07/21/16)   Time 4   Period Weeks   Status Achieved           PT Long Term Goals - 08/27/16 0944      PT LONG TERM GOAL #1  Title independent with advanced HEP (10/08/16)   Time 8   Period Weeks   Status Partially Met  12.14.17: pt. admitting to poor adherence to HEP at this [place], "getting to some of the activities every couple of days".      PT LONG TERM GOAL #2   Title demonstrate R shoulder AROM flexion 110 degrees; abduction 90 degrees and internal rotation 50 degrees for improved function (10/08/16)   Time 8   Period Weeks   Status On-going  12.14.17: Pt. able to demo R shoulder AROM flexion 101 dg, abd 84 dg, IR 33 dg, ER 36 dg     PT LONG TERM GOAL #3   Title demonstrate 4/5 strength in abduction, flexion and internal rotation for improved R shoulder function (10/08/16)   Time 8   Period Weeks   Status Achieved  12.14.17: achieved however pt. reporting 1/10 pain with IR strength testing     PT LONG TERM GOAL #4   Title independent with ADLs without increase in pain (10/08/16)   Time 8   Period Weeks   Status Achieved  12.14.17: Pt. reporting he is pain  free with all ADL however still with difficulty and pain getting up and down from floor.                 Plan - 09/08/16 0903    Clinical Impression Statement Pt. reporting he is now able to reach behind head without pain and reporting pain free over holiday weekend.  Pt. tolerated addition of red TB resisted diagonal patterns at R shoulder and progression to 3# flexion/scaption with only minimal pain.  Pt. still weak with overhead motion however progressed well today.  Pt. with MD f/u scheduled for 1.3.17.     PT Treatment/Interventions ADLs/Self Care Home Management;Cryotherapy;Electrical Stimulation;Moist Heat;Balance training;Therapeutic exercise;Therapeutic activities;Patient/family education;Gait training;Manual techniques;Passive range of motion;Vasopneumatic Device;Dry needling   PT Next Visit Plan continue strengthening activities per tolerance; progress AROM      Patient will benefit from skilled therapeutic intervention in order to improve the following deficits and impairments:  Pain, Impaired UE functional use, Decreased strength, Increased edema, Decreased balance, Decreased range of motion, Postural dysfunction  Visit Diagnosis: Acute pain of right shoulder  Stiffness of right shoulder, not elsewhere classified  Abnormal posture  Muscle weakness (generalized)     Problem List Patient Active Problem List   Diagnosis Date Noted  . 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  [place], [place], [address] Phone: [phone]   Fax:  [phone]  Name: [name] Soto MRN: [id number] Date of Birth: October 17, 1945

## 2016-09-10 ENCOUNTER — Ambulatory Visit: Payer: Medicare Other | Admitting: Physical Therapy

## 2016-09-10 DIAGNOSIS — M25511 Pain in right shoulder: Secondary | ICD-10-CM

## 2016-09-10 DIAGNOSIS — M25611 Stiffness of right shoulder, not elsewhere classified: Secondary | ICD-10-CM

## 2016-09-10 DIAGNOSIS — M6281 Muscle weakness (generalized): Secondary | ICD-10-CM

## 2016-09-10 DIAGNOSIS — R293 Abnormal posture: Secondary | ICD-10-CM

## 2016-09-10 NOTE — Therapy (Signed)
[facility] [address]  [place], [place], [address] Phone: [phone]   Fax:  [phone]  Physical Therapy Treatment  Patient Details  Name: [name] Soto MRN: [id number] Date of Birth: 03-01-1946 Referring Provider: Dr. Justice Britain  Encounter Date: 09/10/2016      PT End of Session - 09/10/16 0844    Visit Number 24   Number of Visits 32   Date for PT Re-Evaluation 10/08/16   PT Start Time 0844   PT Stop Time 0929   PT Time Calculation (min) 45 min   Activity Tolerance Patient tolerated treatment well   Behavior During Therapy [facility] for tasks assessed/performed      Past Medical History:  Diagnosis Date  . Cerebral thrombosis with cerebral infarction ([place])   . Diabetes mellitus without complication ([place])   . GERD (gastroesophageal reflux disease)   . Humerus fracture    PROXIMAL RIGHT  . Hypertension   . Pancreatic lesion   . Stroke [facility])     Past Surgical History:  Procedure Laterality Date  . CATARACT EXTRACTION     RIGHT  . EUS N/A 07/17/2015   Procedure: ESOPHAGEAL ENDOSCOPIC ULTRASOUND (EUS) RADIAL;  Surgeon: Arta Silence, MD;  [facility];  Service: Endoscopy;  Laterality: N/A;  . FINE NEEDLE ASPIRATION N/A 07/17/2015   Procedure: FINE NEEDLE ASPIRATION (FNA) RADIAL;  Surgeon: Arta Silence, MD;  [facility];  Service: Endoscopy;  Laterality: N/A;  . HEMATOMA DRAINED    . HERNIA REPAIR     inguinal right  . HYDROCELE EXCISION    . REVERSE SHOULDER ARTHROPLASTY Right 05/07/2016   Procedure: RIGHT REVERSE TOTAL SHOULDER ARTHROPLASTY;  Surgeon: Justice Britain, MD;  Location: [place];  Service: Orthopedics;  Laterality: Right;    There were no vitals filed for this visit.      Subjective Assessment - 09/10/16 0844    Subjective patient feeling like his shouder is getting better - just some soreness   Pertinent History CVA (residual L sided weakness), DM, HTN,  pancreatic lesion   Patient Stated Goals improve use of R arm   Currently in Pain? No/denies   Pain Score 0-No pain                         [facility] Adult PT Treatment/Exercise - 09/10/16 0001      Shoulder Exercises: Seated   Flexion AROM;AAROM;Right;10 reps   Flexion Limitations with 3 sec hold at end available range; eccentric lowering     Shoulder Exercises: Standing   Internal Rotation Strengthening;Right;15 reps;Theraband   Theraband Level (Shoulder Internal Rotation) Level 3 (Green)   Internal Rotation Limitations 2 sets   Flexion AROM;Strengthening;Both;15 reps;Weights   Shoulder Flexion Weight (lbs) 3   Flexion Limitations 2 sets; back on 1/2 foam roll   Other Standing Exercises R shoulder D1/D2 flexion/extension x 15 reps - red tband     Shoulder Exercises: Pulleys   Flexion 3 minutes   ABduction 3 minutes   ABduction Limitations scaption     Shoulder Exercises: ROM/Strengthening   UBE (Upper Arm Bike) level 4.5 x 6 minutes (3/3)   Cybex Row 15 reps   Cybex Row Limitations 35# - narrow grip; 2 sets                  PT Short Term Goals - 08/13/16 1159      PT SHORT TERM GOAL #1  Title independent with initial HEP (07/21/16)   Time 4   Period Weeks   Status Achieved     PT SHORT TERM GOAL #2   Title improve R shoulder PROM by 15 degrees all motions for improved mobility (07/21/16)   Time 4   Period Weeks   Status Achieved           PT Long Term Goals - 08/27/16 0944      PT LONG TERM GOAL #1   Title independent with advanced HEP (10/08/16)   Time 8   Period Weeks   Status Partially Met  12.14.17: pt. admitting to poor adherence to HEP at this point, "getting to some of the activities every couple of days".      PT LONG TERM GOAL #2   Title demonstrate R shoulder AROM flexion 110 degrees; abduction 90 degrees and internal rotation 50 degrees for improved function (10/08/16)   Time 8   Period Weeks   Status On-going   12.14.17: Pt. able to demo R shoulder AROM flexion 101 dg, abd 84 dg, IR 33 dg, ER 36 dg     PT LONG TERM GOAL #3   Title demonstrate 4/5 strength in abduction, flexion and internal rotation for improved R shoulder function (10/08/16)   Time 8   Period Weeks   Status Achieved  12.14.17: achieved however pt. reporting 1/10 pain with IR strength testing     PT LONG TERM GOAL #4   Title independent with ADLs without increase in pain (10/08/16)   Time 8   Period Weeks   Status Achieved  12.14.17: Pt. reporting he is pain free with all ADL however still with difficulty and pain getting up and down from floor.                 Plan - 09/10/16 0845    Clinical Impression Statement Patient doing well today. Subjective reports of being able to put own hair up without pain. Patient with improved eccentric control of R shoulder, but continues to be limited in overall range and strength. Patient continues to have limited complaince with HEP likely affecting overall functional progression. Patient to continue to benefit from PT to maximize functional use of R UE.    PT Treatment/Interventions ADLs/Self Care Home Management;Cryotherapy;Electrical Stimulation;Moist Heat;Balance training;Therapeutic exercise;Therapeutic activities;Patient/family education;Gait training;Manual techniques;Passive range of motion;Vasopneumatic Device;Dry needling   PT Next Visit Plan continue strengthening activities per tolerance; progress AROM; measurements for MD appt on 09/16/16   Consulted and Agree with Plan of Care Patient      Patient will benefit from skilled therapeutic intervention in order to improve the following deficits and impairments:  Pain, Impaired UE functional use, Decreased strength, Increased edema, Decreased balance, Decreased range of motion, Postural dysfunction  Visit Diagnosis: Acute pain of right shoulder  Stiffness of right shoulder, not elsewhere classified  Abnormal posture  Muscle  weakness (generalized)     Problem List Patient Active Problem List   Diagnosis Date Noted  . 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    

## 2016-09-15 ENCOUNTER — Ambulatory Visit: Payer: Medicare Other | Attending: Orthopedic Surgery

## 2016-09-15 DIAGNOSIS — M25611 Stiffness of right shoulder, not elsewhere classified: Secondary | ICD-10-CM | POA: Diagnosis present

## 2016-09-15 DIAGNOSIS — M6281 Muscle weakness (generalized): Secondary | ICD-10-CM

## 2016-09-15 DIAGNOSIS — M25511 Pain in right shoulder: Secondary | ICD-10-CM | POA: Diagnosis not present

## 2016-09-15 DIAGNOSIS — R293 Abnormal posture: Secondary | ICD-10-CM

## 2016-09-15 NOTE — Therapy (Addendum)
[facility] [address]  [place], [place], [address] Phone: [phone]   Fax:  [phone]  Physical Therapy Treatment  Patient Details  Name: [name] Soto MRN: [id number] Date of Birth: 01/28/1946 Referring Provider: Dr. Justice Britain  Encounter Date: 09/15/2016      PT End of Session - 09/15/16 0853    Visit Number 25   Number of Visits 32   Date for PT Re-Evaluation 10/08/16   PT Start Time 0849   PT Stop Time 0930   PT Time Calculation (min) 41 min   Activity Tolerance Patient tolerated treatment well   Behavior During Therapy [facility] for tasks assessed/performed      Past Medical History:  Diagnosis Date  . Cerebral thrombosis with cerebral infarction ([place])   . Diabetes mellitus without complication ([place])   . GERD (gastroesophageal reflux disease)   . Humerus fracture    PROXIMAL RIGHT  . Hypertension   . Pancreatic lesion   . Stroke [facility])     Past Surgical History:  Procedure Laterality Date  . CATARACT EXTRACTION     RIGHT  . EUS N/A 07/17/2015   Procedure: ESOPHAGEAL ENDOSCOPIC ULTRASOUND (EUS) RADIAL;  Surgeon: Arta Silence, MD;  Location: [facility];  Service: Endoscopy;  Laterality: N/A;  . FINE NEEDLE ASPIRATION N/A 07/17/2015   Procedure: FINE NEEDLE ASPIRATION (FNA) RADIAL;  Surgeon: Arta Silence, MD;  Location: [facility];  Service: Endoscopy;  Laterality: N/A;  . HEMATOMA DRAINED    . HERNIA REPAIR     inguinal right  . HYDROCELE EXCISION    . REVERSE SHOULDER ARTHROPLASTY Right 05/07/2016   Procedure: RIGHT REVERSE TOTAL SHOULDER ARTHROPLASTY;  Surgeon: Justice Britain, MD;  Location: [facility];  Service: Orthopedics;  Laterality: Right;    There were no vitals filed for this visit.      Subjective Assessment - 09/15/16 0852    Subjective Pt. reporting R shoulder soreness over the weekend which he attributes to the cold weather.  Pt. confirming MD f/u tomorrow.     Patient Stated  Goals improve use of R arm   Currently in Pain? No/denies   Pain Score 0-No pain   Multiple Pain Sites No            [facility] PT Assessment - 09/15/16 0908      AROM   Right/Left Shoulder Right   Right Shoulder Flexion 121 Degrees   Right Shoulder ABduction 103 Degrees   Right Shoulder Internal Rotation 63 Degrees  taken in seated    Right Shoulder External Rotation 41 Degrees     Strength   Right/Left Shoulder Right   Right Shoulder Flexion 4/5   Right Shoulder ABduction 4/5   Right Shoulder Internal Rotation 4/5  Pt. still reporting 3/10   Right Shoulder External Rotation 4-/5           [facility] Adult PT Treatment/Exercise - 09/15/16 0924      Shoulder Exercises: Standing   Flexion AROM;Strengthening;Both;Weights;20 reps   Shoulder Flexion Weight (lbs) 3   Flexion Limitations 2 sets; back on 1/2 foam roll   ABduction AROM;Both;20 reps;Weights   Shoulder ABduction Weight (lbs) 3     Shoulder Exercises: Pulleys   Flexion 3 minutes   ABduction 3 minutes   ABduction Limitations scaption     Shoulder Exercises: ROM/Strengthening   UBE (Upper Arm Bike) level 5.0 x 6 minutes (3/3)     Manual Therapy   Manual Therapy Passive  ROM   Passive ROM R shoulder flexion, abduction, ir/er to tolerance                  PT Short Term Goals - 08/13/16 1159      PT SHORT TERM GOAL #1   Title independent with initial HEP (07/21/16)   Time 4   Period Weeks   Status Achieved     PT SHORT TERM GOAL #2   Title improve R shoulder PROM by 15 degrees all motions for improved mobility (07/21/16)   Time 4   Period Weeks   Status Achieved           PT Long Term Goals - 09/15/16 0901      PT LONG TERM GOAL #1   Title Independent with advanced HEP (10/08/16)   Time 8   Period Weeks   Status Partially Met  1.2.18: pt. admitting to poor adherence to HEP at this point     PT LONG TERM GOAL #2   Title demonstrate R shoulder AROM flexion 110 degrees; abduction 90  degrees and internal rotation 50 degrees for improved function (10/08/16)   Time 8   Period Weeks   Status Achieved  1.2.18: Pt. able to demo R shoulder AROM flexion 121 dg, abd 103 dg, IR 63 dg (seated)      PT LONG TERM GOAL #3   Title demonstrate 4/5 strength in abduction, flexion and internal rotation for improved R shoulder function (10/08/16)   Time 8   Period Weeks   Status Achieved  1.2.18: achieved however pt. reporting 3/10 pain with max effort IR strength testing     PT LONG TERM GOAL #4   Title independent with ADLs without increase in pain (10/08/16)   Time 8   Period Weeks   Status Partially Met  1.2.18: Pt. reporting he is still limited in lifting heavy objects with R shoulder however is now able to get up and down from floor easily pain free               Plan - 09/15/16 0928    Clinical Impression Statement Pt. admitting to poor HEP adherence today however reporting he is now able to get up and down from floor without pain and fix hair without pain.  Pt. able to meet strength and AROM goals today however still very weak with overhead motions.  Pt. still reporting being limited lifting objects to shoulder height and feels this is his chief functional limitation at this point.  Pt. would continue to benefit from further skilled therapy to improve functional strength with overhead motions.     PT Treatment/Interventions ADLs/Self Care Home Management;Cryotherapy;Electrical Stimulation;Moist Heat;Balance training;Therapeutic exercise;Therapeutic activities;Patient/family education;Gait training;Manual techniques;Passive range of motion;Vasopneumatic Device;Dry needling   PT Next Visit Plan continue strengthening activities per tolerance; progress AROM       Patient will benefit from skilled therapeutic intervention in order to improve the following deficits and impairments:  Pain, Impaired UE functional use, Decreased strength, Increased edema, Decreased balance,  Decreased range of motion, Postural dysfunction  Visit Diagnosis: Acute pain of right shoulder  Stiffness of right shoulder, not elsewhere classified  Abnormal posture  Muscle weakness (generalized)     G-Codes -    Functional Assessment Tool Used FOTO: 59% (41% limited)   Functional Limitation Carrying, moving and handling objects   Carrying, Moving and Handling Objects Goal Status (S9233) At least 20 percent but less than 40 percent impaired, limited or restricted  Carrying, Moving and Handling Objects Discharge Status [phone]) At least 40 percent but less than 60 percent impaired, limited or restricted    Problem List Patient Active Problem List   Diagnosis Date Noted  . 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  Patient goals were partially met. Patient is being discharged due to the physician's request.  ?????    Patient reporting MD releasing patient from Pt. Will gladly see patient in the future for any other needs.   Lanney Gins, PT, DPT 10/12/16 8:26 AM   [facility] [address]  [place], [place], [address] Phone: [phone]   Fax:  [phone]  Name: [name] Soto MRN: [id number] Date of Birth: 1945-11-11

## 2016-09-17 ENCOUNTER — Ambulatory Visit: Payer: Medicare Other

## 2016-09-22 ENCOUNTER — Ambulatory Visit: Payer: Medicare Other

## 2016-09-24 ENCOUNTER — Ambulatory Visit: Payer: Medicare Other | Admitting: Physical Therapy

## 2016-10-28 IMAGING — CT CT SHOULDER*R* W/O CM
2 series · 14 of 20 positions shown, 17 images · non-contrast
Comparison: 04/27/2016

CLINICAL DATA: Fall on right shoulder, fracture.

EXAM:
CT OF THE RIGHT SHOULDER WITHOUT CONTRAST
TECHNIQUE: Multidetector CT imaging was performed according to the standard
protocol. Multiplanar CT image reconstructions were also generated.

[Series 6: soft tissue · axial · 0.49mm/px · z∈[+803,+971]mm · 11 of 100 slices shown, 14 images]
[im 8/100  soft-tissue]
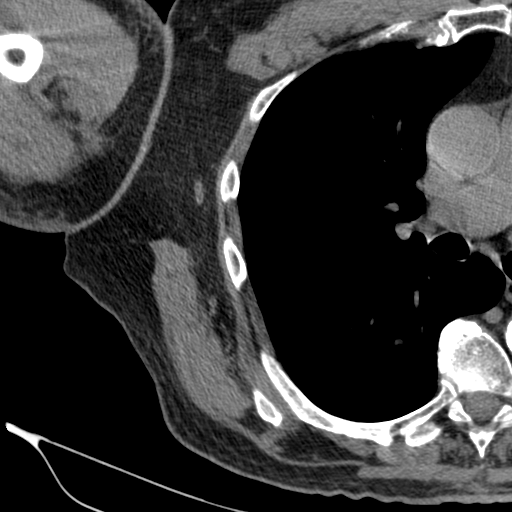
[im 8/100  bone]
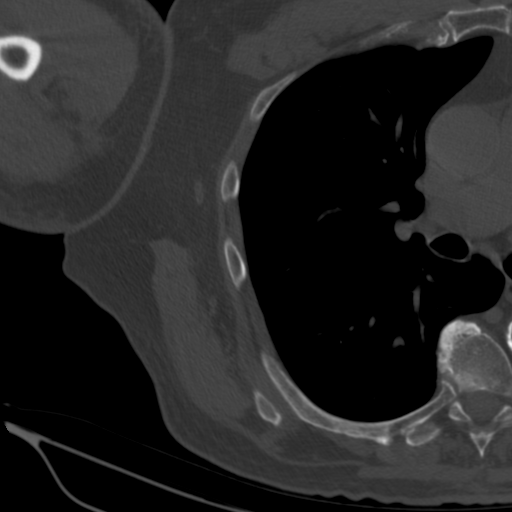
[im 16/100  bone]
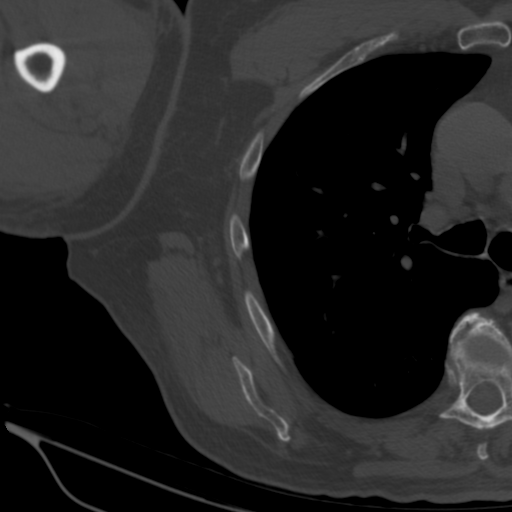
[im 23/100  bone]
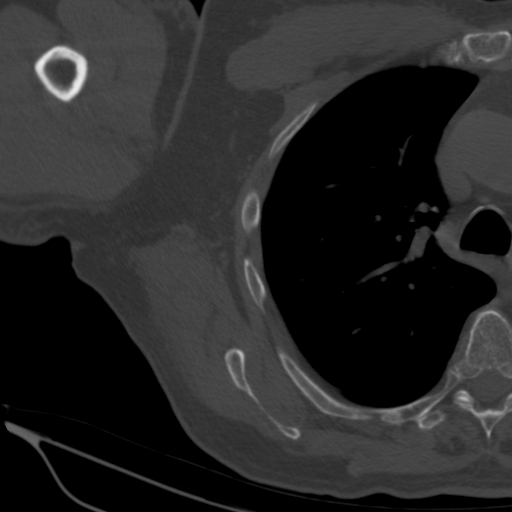
[im 31/100  bone]
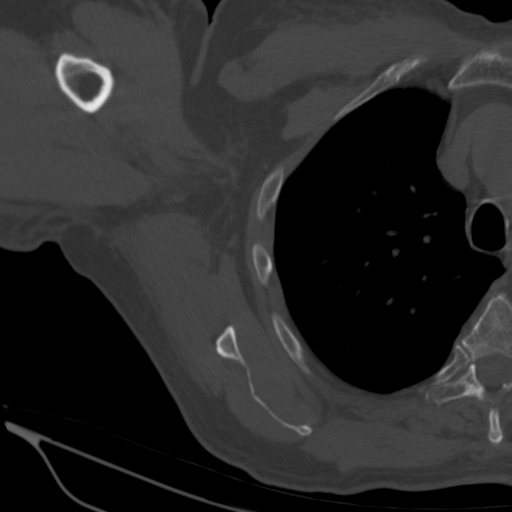
[im 39/100  soft-tissue]
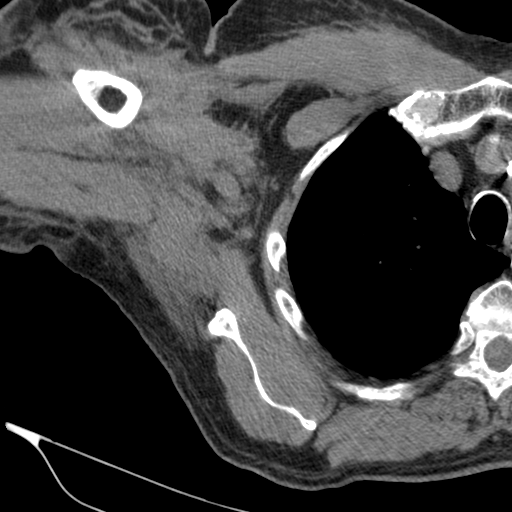
[im 39/100  bone]
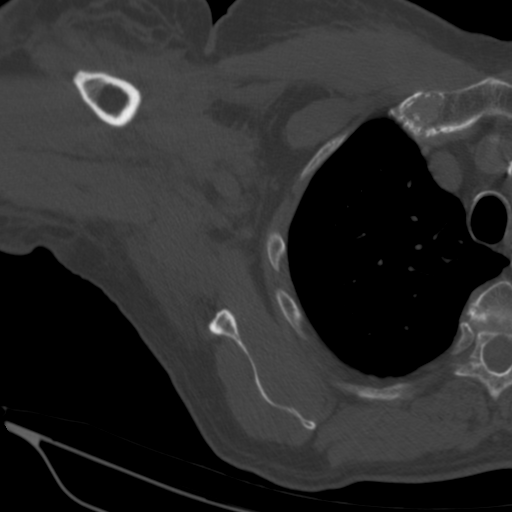
[im 54/100  bone]
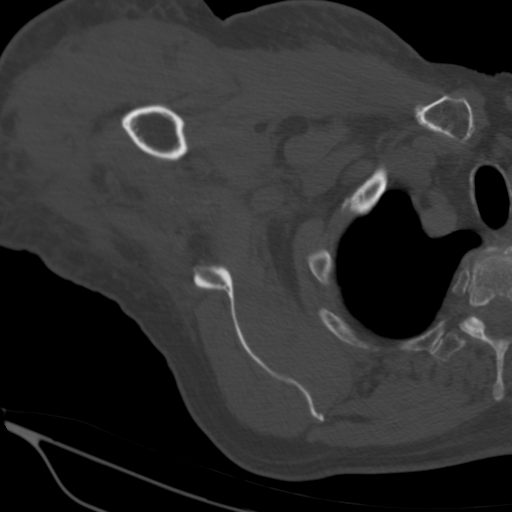
[im 61/100  bone]
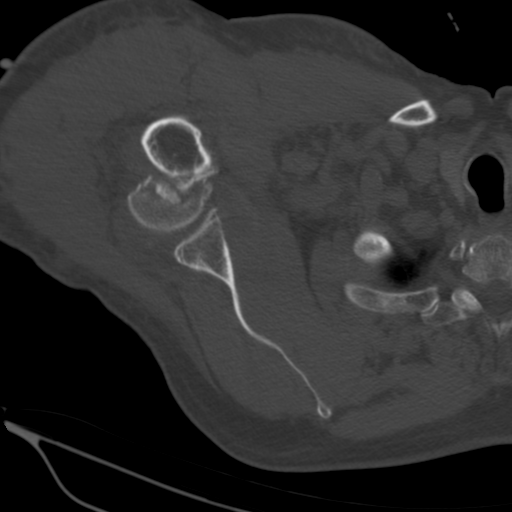
[im 69/100  bone]
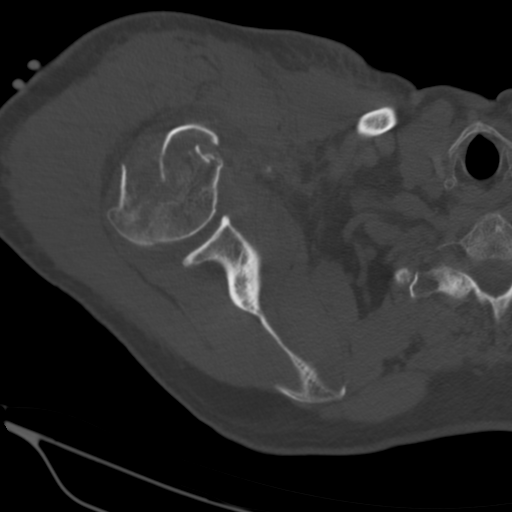
[im 77/100  soft-tissue]
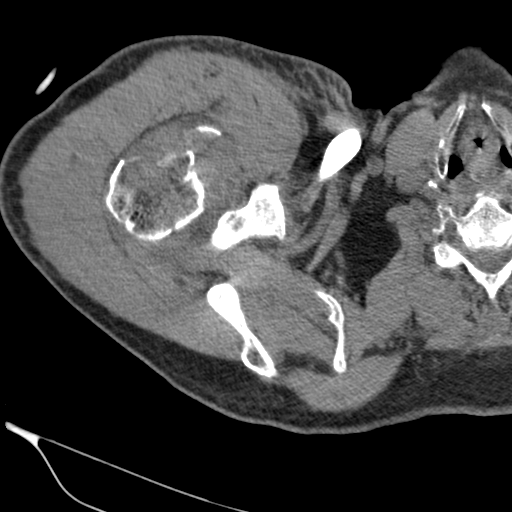
[im 77/100  bone]
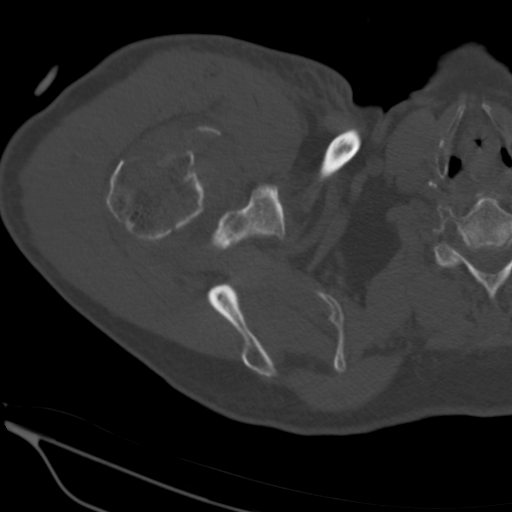
[im 84/100  bone]
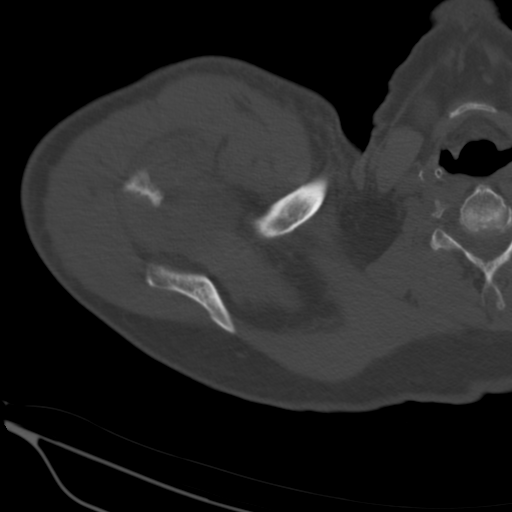
[im 92/100  bone]
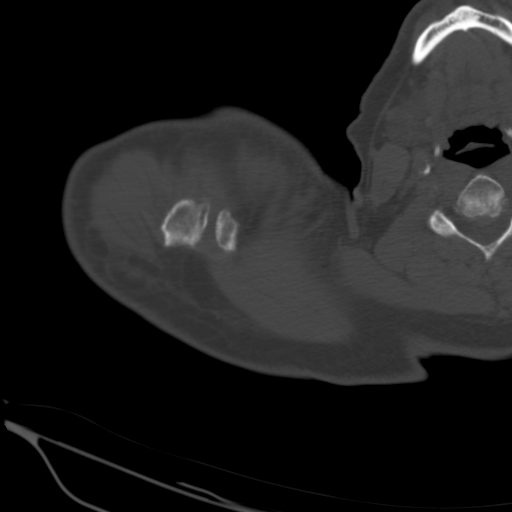

[Series 8: cor soft · coronal · 0.38mm/px · 3 of 104 slices shown]
[im 21/104  bone]
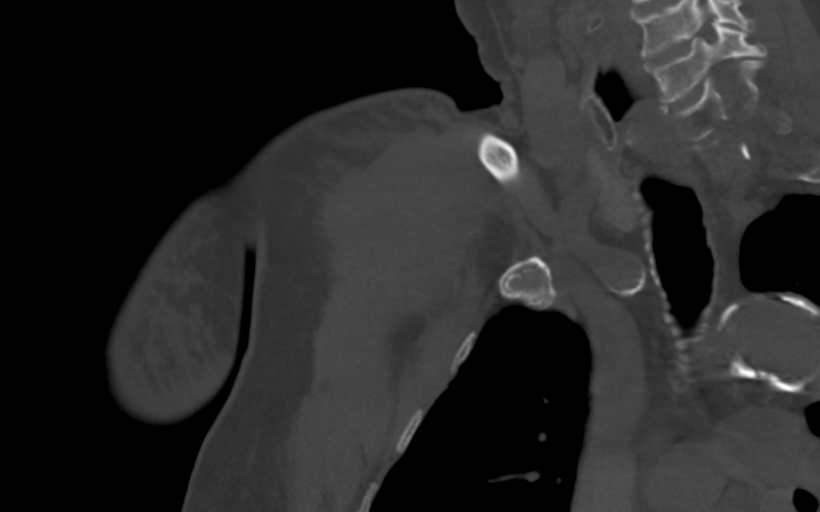
[im 42/104  bone]
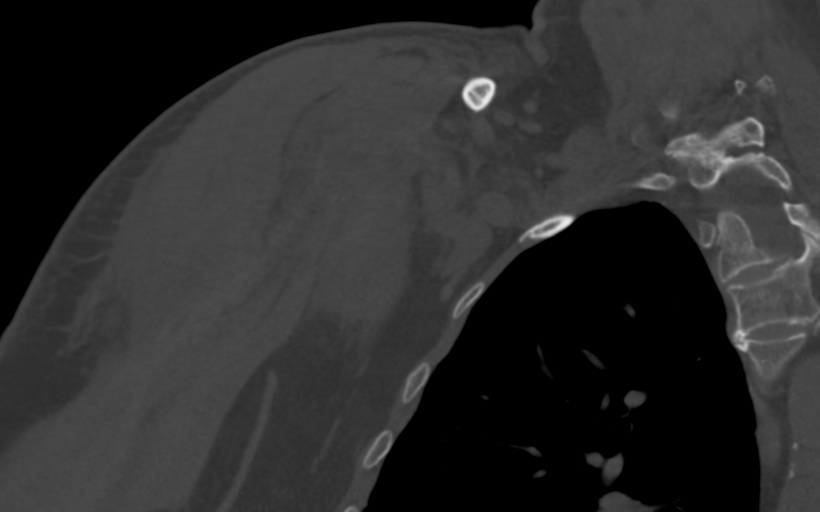
[im 62/104  bone]
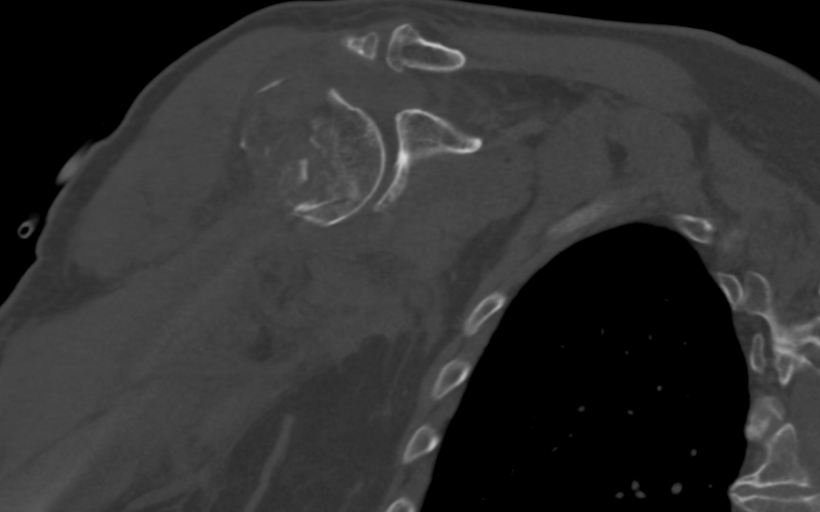

[14 of 20 positions shown; findings below may reference images not displayed]

FINDINGS: Surgical neck fracture right proximal humerus, 2.4 cm anterior
displacement of the shaft fragment with respect to the humeral head
fragment. Comminution along the fracture plane. Some of this extends
into the humeral head and along the greater tuberosity. The greater
tuberosity fracture plane is only displaced up to about 5 mm and
accordingly does not count as a separate "part." Thus I consider
this a 2 part fracture.

Joint effusion. No scapular or clavicular fracture identified. I do
not observe an adjacent rib fracture. There is bruising anterior to
the pectoralis musculature.

Oval-shaped 0.6 by 0.4 cm nodule adjacent to several adjacent 3-4 mm
nodules, images 83 through 86 series 3 in the right upper lobe.
Calcified nodule in the superior segment right lower lobe, image 91
series 3.

Thoracic spondylosis. Atherosclerotic calcification of the aortic
arch.
IMPRESSION: 1. Two part fracture of the right proximal humerus, with
displacement along the surgical neck fracture site. There is some
comminuted fracture extending into the greater tuberosity and
humeral head but not displaced and thus not qualifying as separate
parts.
2. Surrounding joint effusion and soft tissue hematoma.
3. Several clustered nodules in the right upper lobe, largest has an
average size of 0.5 cm. No follow-up needed if patient is low-risk.
Non-contrast chest CT can be considered in 12 months if patient is
high-risk. This recommendation follows the consensus statement:
Guidelines for Management of Incidental Pulmonary Nodules Detected
on CT Images:From the [HOSPITAL] 5074; published online
before print (10.1148/radiol.0284878716).
4. Atherosclerotic aortic arch.

## 2016-10-29 ENCOUNTER — Other Ambulatory Visit: Payer: Self-pay | Admitting: General Surgery

## 2016-10-29 DIAGNOSIS — K862 Cyst of pancreas: Secondary | ICD-10-CM

## 2017-01-26 ENCOUNTER — Ambulatory Visit
Admission: RE | Admit: 2017-01-26 | Discharge: 2017-01-26 | Disposition: A | Discharge status: Home / Self Care | Payer: Medicare Other | Source: Ambulatory Visit | Attending: General Surgery | Admitting: General Surgery

## 2017-01-26 DIAGNOSIS — K862 Cyst of pancreas: Secondary | ICD-10-CM

## 2017-01-26 MED ORDER — GADOBENATE DIMEGLUMINE 529 MG/ML IV SOLN
20.0000 mL | Freq: Once | INTRAVENOUS | Status: AC | PRN
  Administered 2017-01-26: 20 mL via INTRAVENOUS

## 2017-01-27 NOTE — Progress Notes (Signed)
Please let patient know pancreatic mass is unchanged.  Will recheck in 1-2 years.

## 2018-11-17 ENCOUNTER — Other Ambulatory Visit: Payer: Self-pay | Admitting: Internal Medicine

## 2018-11-17 DIAGNOSIS — K869 Disease of pancreas, unspecified: Secondary | ICD-10-CM

## 2018-12-01 ENCOUNTER — Other Ambulatory Visit: Payer: Self-pay

## 2018-12-01 ENCOUNTER — Ambulatory Visit
Admission: RE | Admit: 2018-12-01 | Discharge: 2018-12-01 | Disposition: A | Discharge status: Home / Self Care | Payer: Medicare Other | Source: Ambulatory Visit | Attending: Internal Medicine | Admitting: Internal Medicine

## 2018-12-01 DIAGNOSIS — K869 Disease of pancreas, unspecified: Secondary | ICD-10-CM

## 2018-12-01 MED ORDER — GADOBENATE DIMEGLUMINE 529 MG/ML IV SOLN
20.0000 mL | Freq: Once | INTRAVENOUS | Status: AC | PRN
  Administered 2018-12-01: 20 mL via INTRAVENOUS

## 2020-09-19 NOTE — Progress Notes (Signed)
COVID Vaccine Completed: Date COVID Vaccine completed: COVID vaccine manufacturer: Pfizer    Moderna   [employer]   PCP - Doreatha Martin, MD Cardiologist -   Chest x-ray -  EKG -  Stress Test -  ECHO -  Cardiac Cath -  Pacemaker/ICD device last checked:  Sleep Study -  CPAP -   Fasting Blood Sugar -  Checks Blood Sugar _____ times a day  Blood Thinner Instructions: Aspirin Instructions: Last Dose:  Anesthesia review:  Hx of CVA  Patient denies shortness of breath, fever, cough and chest pain at PAT appointment   Patient verbalized understanding of instructions that were given to them at the PAT appointment. Patient was also instructed that they will need to review over the PAT instructions again at home before surgery.

## 2020-09-19 NOTE — Patient Instructions (Addendum)
DUE TO COVID-19 ONLY ONE VISITOR IS ALLOWED TO COME WITH YOU AND STAY IN THE WAITING ROOM ONLY DURING PRE OP AND PROCEDURE.   IF YOU WILL BE ADMITTED INTO THE HOSPITAL YOU ARE ALLOWED ONE SUPPORT PERSON DURING VISITATION HOURS ONLY (10AM -8PM)   . The support person may change daily. . The support person must pass our screening, gel in and out, and wear a mask at all times, including in the patient's room. . Patients must also wear a mask when staff or their support person are in the room.   COVID SWAB TESTING MUST BE COMPLETED ON:   Monday, 09-23-20 @   [address] [place], [place] [address]  (Must self quarantine after testing. Follow instructions on handout.)    Your procedure is scheduled on:   Thursday, 09-26-20   Report to [facility] Main  Entrance   Report to admitting at 10:25 AM   Call this number if you have problems the morning of surgery [phone]   Do not eat food :After Midnight.   May have liquids until 9:15 AM  day of surgery  CLEAR LIQUID DIET  Foods Allowed                                                                     Foods Excluded  Water, Black Coffee and tea, regular and decaf              liquids that you cannot  Plain Jell-O in any flavor  (No red)                                    see through such as: Fruit ices (not with fruit pulp)                                      milk, soups, orange juice              Iced Popsicles (No red)                                      All solid food                                   Apple juices Sports drinks like Gatorade (No red) Lightly seasoned clear broth or consume(fat free) Sugar, honey syrup      Oral Hygiene is also important to reduce your risk of infection.                                    Remember - BRUSH YOUR TEETH THE MORNING OF SURGERY WITH YOUR REGULAR TOOTHPASTE   Do NOT smoke after Midnight   Take these medicines the morning of surgery with A SIP OF WATER:   Atorvastain   How to Manage Your Diabetes Before and After Surgery  Why is it important to control my blood sugar before and after surgery? . Improving blood sugar levels before and after surgery helps healing and can limit problems. . A way of improving blood sugar control is eating a healthy diet by: o  Eating less sugar and carbohydrates o  Increasing activity/exercise o  Talking with your doctor about reaching your blood sugar goals . High blood sugars (greater than 180 mg/dL) can raise your risk of infections and slow your recovery, so you will need to focus on controlling your diabetes during the weeks before surgery. . Make sure that the doctor who takes care of your diabetes knows about your planned surgery including the date and location.  How do I manage my blood sugar before surgery? . Check your blood sugar at least 4 times a day, starting 2 days before surgery, to make sure that the level is not too high or low. o Check your blood sugar the morning of your surgery when you wake up and every 2 hours until you get to the Short Stay unit. . If your blood sugar is less than 70 mg/dL, you will need to treat for low blood sugar: o Do not take insulin. o Treat a low blood sugar (less than 70 mg/dL) with  cup of clear juice (cranberry or apple), 4 glucose tablets, OR glucose gel. o Recheck blood sugar in 15 minutes after treatment (to make sure it is greater than 70 mg/dL). If your blood sugar is not greater than 70 mg/dL on recheck, call [phone] for further instructions. . Report your blood sugar to the short stay nurse when you get to Short Stay.  . If you are admitted to the hospital after surgery: o Your blood sugar will be checked by the staff and you will probably be given insulin after surgery (instead of oral diabetes medicines) to make sure you have good blood sugar levels. o The goal for blood sugar control after surgery is 80-180 mg/dL.   WHAT DO I DO ABOUT MY  DIABETES MEDICATION?  [name] Do not take oral diabetes medicines (pills) the morning of surgery.  . THE DAY BEFORE SURGERY:  Take Metformin as prescribed.       . THE MORNING OF SURGERY:  Do not take Metformin.   Reviewed and Endorsed by [facility] Patient Education Committee, August 2015                               You may not have any metal on your body including jewelry, and body piercings             Do not wear lotions, powders, perfumes/cologne, or deodorant             Men may shave face and neck.   Do not bring valuables to the hospital. [place].   Contacts, dentures or bridgework may not be worn into surgery.   Bring small overnight bag day of surgery.                 Please read over the following fact sheets you were given: IF YOU HAVE QUESTIONS ABOUT YOUR PRE OP INSTRUCTIONS PLEASE CALL [phone]   [facility] - Preparing for Surgery Before surgery, you can play an important role.  Because skin is not sterile, your skin needs to be as free of germs as possible.  You can reduce  the number of germs on your skin by washing with CHG (chlorahexidine gluconate) soap before surgery.  CHG is an antiseptic cleaner which kills germs and bonds with the skin to continue killing germs even after washing. Please DO NOT use if you have an allergy to CHG or antibacterial soaps.  If your skin becomes reddened/irritated stop using the CHG and inform your nurse when you arrive at Short Stay. Do not shave (including legs and underarms) for at least 48 hours prior to the first CHG shower.  You may shave your face/neck.  Please follow these instructions carefully:  1.  Shower with CHG Soap the night before surgery and the  morning of surgery.  2.  If you choose to wash your hair, wash your hair first as usual with your normal  shampoo.  3.  After you shampoo, rinse your hair and body thoroughly to remove the shampoo.                             4.  Use  CHG as you would any other liquid soap.  You can apply chg directly to the skin and wash.  Gently with a scrungie or clean washcloth.  5.  Apply the CHG Soap to your body ONLY FROM THE NECK DOWN.   Do   not use on face/ open                           Wound or open sores. Avoid contact with eyes, ears mouth and   genitals (private parts).                       Wash face,  Genitals (private parts) with your normal soap.             6.  Wash thoroughly, paying special attention to the area where your    surgery  will be performed.  7.  Thoroughly rinse your body with warm water from the neck down.  8.  DO NOT shower/wash with your normal soap after using and rinsing off the CHG Soap.                9.  Pat yourself dry with a clean towel.            10.  Wear clean pajamas.            11.  Place clean sheets on your bed the night of your first shower and do not  sleep with pets. Day of Surgery : Do not apply any lotions/deodorants the morning of surgery.  Please wear clean clothes to the hospital/surgery center.  FAILURE TO FOLLOW THESE INSTRUCTIONS MAY RESULT IN THE CANCELLATION OF YOUR SURGERY  PATIENT SIGNATURE_________________________________  NURSE SIGNATURE__________________________________  ________________________________________________________________________

## 2020-09-20 ENCOUNTER — Other Ambulatory Visit: Payer: Self-pay

## 2020-09-20 ENCOUNTER — Encounter (HOSPITAL_COMMUNITY): Payer: Self-pay

## 2020-09-20 ENCOUNTER — Encounter (HOSPITAL_COMMUNITY)
Admission: RE | Admit: 2020-09-20 | Discharge: 2020-09-20 | Disposition: A | Discharge status: Home / Self Care | Payer: Medicare Other | Source: Ambulatory Visit | Attending: Orthopedic Surgery | Admitting: Orthopedic Surgery

## 2020-09-20 DIAGNOSIS — Z01818 Encounter for other preprocedural examination: Secondary | ICD-10-CM | POA: Insufficient documentation

## 2020-09-20 DIAGNOSIS — I10 Essential (primary) hypertension: Secondary | ICD-10-CM | POA: Insufficient documentation

## 2020-09-20 LAB — BASIC METABOLIC PANEL
Anion gap: 12 (ref 5–15)
BUN: 21 mg/dL (ref 8–23)
CO2: 24 mmol/L (ref 22–32)
Calcium: 9 mg/dL (ref 8.9–10.3)
Chloride: 106 mmol/L (ref 98–111)
Creatinine, Ser: 0.96 mg/dL (ref 0.61–1.24)
GFR, Estimated: >60 mL/min (ref >60)
Glucose, Bld: 188 mg/dL — ABNORMAL HIGH (ref 70–99)
Potassium: 4 mmol/L (ref 3.5–5.1)
Sodium: 142 mmol/L (ref 135–145)

## 2020-09-20 LAB — CBC
HCT: 37.8 % — ABNORMAL LOW (ref 39.0–52.0)
Hemoglobin: 12.4 g/dL — ABNORMAL LOW (ref 13.0–17.0)
MCH: 30.4 pg (ref 26.0–34.0)
MCHC: 32.8 g/dL (ref 30.0–36.0)
MCV: 92.6 fL (ref 80.0–100.0)
Platelets: 194 10*3/uL (ref 150–400)
RBC: 4.08 MIL/uL — ABNORMAL LOW (ref 4.22–5.81)
RDW: 14.4 % (ref 11.5–15.5)
WBC: 7.9 10*3/uL (ref 4.0–10.5)
nRBC: 0 % (ref 0.0–0.2)

## 2020-09-20 LAB — HEMOGLOBIN A1C
Hgb A1c MFr Bld: 7 % — ABNORMAL HIGH (ref 4.8–5.6)
Mean Plasma Glucose: 154.2 mg/dL

## 2020-09-20 LAB — GLUCOSE, CAPILLARY: Glucose-Capillary: 196 mg/dL — ABNORMAL HIGH (ref 70–99)

## 2020-09-20 NOTE — Progress Notes (Signed)
COVID Vaccine Completed:  No Date COVID Vaccine completed: COVID vaccine manufacturer: Pfizer    Moderna   [employer]   PCP - Doreatha Martin, MD Cardiologist - N/A  Chest x-ray -  EKG - 09-20-2020 in Epic Stress Test -  ECHO -  Cardiac Cath -  Pacemaker/ICD device last checked:  Sleep Study - N/A CPAP -   Fasting Blood Sugar - 100 to 140s Checks Blood Sugar - check occasionally  Blood Thinner Instructions: Aspirin Instructions:  ASA 81 mg  Last Dose:  Anesthesia review:   Patient denies shortness of breath, fever, cough and chest pain at PAT appointment.  Pt able to slowly climb a flight of stairs due to mobility issues.  Able to do light housework prior to fall.     Patient verbalized understanding of instructions that were given to them at the PAT appointment. Patient was also instructed that they will need to review over the PAT instructions again at home before surgery.

## 2020-09-23 ENCOUNTER — Other Ambulatory Visit (HOSPITAL_COMMUNITY): Payer: Medicare Other

## 2020-09-24 ENCOUNTER — Other Ambulatory Visit (HOSPITAL_COMMUNITY)
Admission: RE | Admit: 2020-09-24 | Discharge: 2020-09-24 | Disposition: A | Discharge status: Home / Self Care | Payer: Medicare Other | Source: Ambulatory Visit | Attending: Orthopedic Surgery | Admitting: Orthopedic Surgery

## 2020-09-24 DIAGNOSIS — Z01812 Encounter for preprocedural laboratory examination: Secondary | ICD-10-CM | POA: Insufficient documentation

## 2020-09-24 DIAGNOSIS — Z20822 Contact with and (suspected) exposure to covid-19: Secondary | ICD-10-CM | POA: Diagnosis not present

## 2020-09-24 LAB — SARS CORONAVIRUS 2 (TAT 6-24 HRS): SARS Coronavirus 2: NEGATIVE

## 2020-09-26 ENCOUNTER — Ambulatory Visit (HOSPITAL_COMMUNITY): Payer: Medicare Other

## 2020-09-26 ENCOUNTER — Other Ambulatory Visit: Payer: Self-pay

## 2020-09-26 ENCOUNTER — Ambulatory Visit (HOSPITAL_COMMUNITY): Payer: Medicare Other | Admitting: Anesthesiology

## 2020-09-26 ENCOUNTER — Ambulatory Visit (HOSPITAL_COMMUNITY)
Admission: RE | Admit: 2020-09-26 | Discharge: 2020-09-27 | Disposition: A | Discharge status: Home / Self Care | Payer: Medicare Other | Attending: Orthopedic Surgery | Admitting: Orthopedic Surgery

## 2020-09-26 ENCOUNTER — Encounter (HOSPITAL_COMMUNITY): Payer: Self-pay | Admitting: Orthopedic Surgery

## 2020-09-26 ENCOUNTER — Encounter (HOSPITAL_COMMUNITY)
Admission: RE | Disposition: A | Discharge status: Home / Self Care | Payer: Self-pay | Source: Home / Self Care | Attending: Orthopedic Surgery

## 2020-09-26 DIAGNOSIS — M9731XA Periprosthetic fracture around internal prosthetic right shoulder joint, initial encounter: Secondary | ICD-10-CM | POA: Insufficient documentation

## 2020-09-26 DIAGNOSIS — Z96611 Presence of right artificial shoulder joint: Secondary | ICD-10-CM | POA: Diagnosis not present

## 2020-09-26 DIAGNOSIS — W19XXXA Unspecified fall, initial encounter: Secondary | ICD-10-CM | POA: Diagnosis not present

## 2020-09-26 DIAGNOSIS — Z79899 Other long term (current) drug therapy: Secondary | ICD-10-CM | POA: Diagnosis not present

## 2020-09-26 DIAGNOSIS — Z419 Encounter for procedure for purposes other than remedying health state, unspecified: Secondary | ICD-10-CM

## 2020-09-26 DIAGNOSIS — Z7984 Long term (current) use of oral hypoglycemic drugs: Secondary | ICD-10-CM | POA: Insufficient documentation

## 2020-09-26 DIAGNOSIS — S42301A Unspecified fracture of shaft of humerus, right arm, initial encounter for closed fracture: Secondary | ICD-10-CM | POA: Diagnosis not present

## 2020-09-26 DIAGNOSIS — Z8781 Personal history of (healed) traumatic fracture: Secondary | ICD-10-CM

## 2020-09-26 DIAGNOSIS — Z9889 Other specified postprocedural states: Secondary | ICD-10-CM

## 2020-09-26 DIAGNOSIS — Z7982 Long term (current) use of aspirin: Secondary | ICD-10-CM | POA: Insufficient documentation

## 2020-09-26 HISTORY — PX: ORIF HUMERUS FRACTURE: SHX2126

## 2020-09-26 LAB — GLUCOSE, CAPILLARY
Glucose-Capillary: 122 mg/dL — ABNORMAL HIGH (ref 70–99)
Glucose-Capillary: 149 mg/dL — ABNORMAL HIGH (ref 70–99)
Glucose-Capillary: 191 mg/dL — ABNORMAL HIGH (ref 70–99)

## 2020-09-26 SURGERY — OPEN REDUCTION INTERNAL FIXATION (ORIF) PROXIMAL HUMERUS FRACTURE
Anesthesia: Regional | Site: Shoulder | Laterality: Right

## 2020-09-26 MED ORDER — MAGNESIUM CITRATE PO SOLN: 1.0000 | Freq: Once | ORAL | Status: DC | PRN

## 2020-09-26 MED ORDER — POLYETHYLENE GLYCOL 3350 17 G PO PACK: 17.0000 g | PACK | Freq: Every day | ORAL | Status: DC | PRN

## 2020-09-26 MED ORDER — OXYCODONE HCL 5 MG PO TABS: 10.0000 mg | ORAL_TABLET | ORAL | Status: DC | PRN

## 2020-09-26 MED ORDER — SUGAMMADEX SODIUM 200 MG/2ML IV SOLN
INTRAVENOUS | Status: DC | PRN
  Administered 2020-09-26: 200 mg via INTRAVENOUS

## 2020-09-26 MED ORDER — HYDROMORPHONE HCL 1 MG/ML IJ SOLN: 0.5000 mg | INTRAMUSCULAR | Status: DC | PRN

## 2020-09-26 MED ORDER — PHENYLEPHRINE HCL (PRESSORS) 10 MG/ML IV SOLN
INTRAVENOUS | Status: AC
  Filled 2020-09-26: qty 1

## 2020-09-26 MED ORDER — PHENYLEPHRINE HCL-NACL 10-0.9 MG/250ML-% IV SOLN
INTRAVENOUS | Status: DC | PRN
  Administered 2020-09-26: 60 ug/min via INTRAVENOUS

## 2020-09-26 MED ORDER — DOXYCYCLINE HYCLATE 100 MG PO TABS
100.0000 mg | ORAL_TABLET | Freq: Two times a day (BID) | ORAL | Status: DC
  Administered 2020-09-26 – 2020-09-27 (×2): 100 mg via ORAL
  Filled 2020-09-26 (×2): qty 1

## 2020-09-26 MED ORDER — INSULIN ASPART 100 UNIT/ML ~~LOC~~ SOLN: 0.0000 [IU] | Freq: Three times a day (TID) | SUBCUTANEOUS | Status: DC

## 2020-09-26 MED ORDER — SODIUM CHLORIDE 0.9 % IR SOLN
Status: DC | PRN
  Administered 2020-09-26: 1000 mL

## 2020-09-26 MED ORDER — ONDANSETRON HCL 4 MG/2ML IJ SOLN: 4.0000 mg | Freq: Four times a day (QID) | INTRAMUSCULAR | Status: DC | PRN

## 2020-09-26 MED ORDER — MIDAZOLAM HCL 2 MG/2ML IJ SOLN
1.0000 mg | Freq: Once | INTRAMUSCULAR | Status: DC
  Filled 2020-09-26: qty 2

## 2020-09-26 MED ORDER — CHLORHEXIDINE GLUCONATE 0.12 % MT SOLN
15.0000 mL | Freq: Once | OROMUCOSAL | Status: AC
  Administered 2020-09-26: 15 mL via OROMUCOSAL

## 2020-09-26 MED ORDER — DOCUSATE SODIUM 100 MG PO CAPS
100.0000 mg | ORAL_CAPSULE | Freq: Two times a day (BID) | ORAL | Status: DC
  Administered 2020-09-26 – 2020-09-27 (×2): 100 mg via ORAL
  Filled 2020-09-26 (×2): qty 1

## 2020-09-26 MED ORDER — CEFAZOLIN SODIUM-DEXTROSE 2-4 GM/100ML-% IV SOLN
2.0000 g | INTRAVENOUS | Status: AC
  Administered 2020-09-26: 2 g via INTRAVENOUS
  Filled 2020-09-26: qty 100

## 2020-09-26 MED ORDER — ONDANSETRON HCL 4 MG/2ML IJ SOLN
INTRAMUSCULAR | Status: AC
  Filled 2020-09-26: qty 2

## 2020-09-26 MED ORDER — METOCLOPRAMIDE HCL 5 MG/ML IJ SOLN: 5.0000 mg | Freq: Three times a day (TID) | INTRAMUSCULAR | Status: DC | PRN

## 2020-09-26 MED ORDER — OXYCODONE HCL 5 MG PO TABS: 5.0000 mg | ORAL_TABLET | ORAL | Status: DC | PRN

## 2020-09-26 MED ORDER — EPHEDRINE 5 MG/ML INJ
INTRAVENOUS | Status: AC
  Filled 2020-09-26: qty 10

## 2020-09-26 MED ORDER — HYDROMORPHONE HCL 1 MG/ML IJ SOLN: 0.2500 mg | INTRAMUSCULAR | Status: DC | PRN

## 2020-09-26 MED ORDER — METHOCARBAMOL 1000 MG/10ML IJ SOLN
500.0000 mg | Freq: Four times a day (QID) | INTRAVENOUS | Status: DC | PRN
  Filled 2020-09-26: qty 5

## 2020-09-26 MED ORDER — VANCOMYCIN HCL 1000 MG IV SOLR
INTRAVENOUS | Status: AC
  Filled 2020-09-26: qty 1000

## 2020-09-26 MED ORDER — LACTATED RINGERS IV SOLN: INTRAVENOUS | Status: DC

## 2020-09-26 MED ORDER — EPHEDRINE SULFATE-NACL 50-0.9 MG/10ML-% IV SOSY
PREFILLED_SYRINGE | INTRAVENOUS | Status: DC | PRN
  Administered 2020-09-26 (×5): 5 mg via INTRAVENOUS

## 2020-09-26 MED ORDER — ONDANSETRON HCL 4 MG/2ML IJ SOLN
INTRAMUSCULAR | Status: DC | PRN
  Administered 2020-09-26: 4 mg via INTRAVENOUS

## 2020-09-26 MED ORDER — FENTANYL CITRATE (PF) 100 MCG/2ML IJ SOLN
INTRAMUSCULAR | Status: DC | PRN
  Administered 2020-09-26 (×4): 50 ug via INTRAVENOUS

## 2020-09-26 MED ORDER — PROPOFOL 10 MG/ML IV BOLUS
INTRAVENOUS | Status: AC
  Filled 2020-09-26: qty 20

## 2020-09-26 MED ORDER — TRANEXAMIC ACID-NACL 1000-0.7 MG/100ML-% IV SOLN
1000.0000 mg | INTRAVENOUS | Status: AC
  Administered 2020-09-26: 1000 mg via INTRAVENOUS
  Filled 2020-09-26: qty 100

## 2020-09-26 MED ORDER — ASPIRIN EC 81 MG PO TBEC
81.0000 mg | DELAYED_RELEASE_TABLET | Freq: Every day | ORAL | Status: DC
  Administered 2020-09-27: 81 mg via ORAL
  Filled 2020-09-26: qty 1

## 2020-09-26 MED ORDER — LISINOPRIL 20 MG PO TABS
40.0000 mg | ORAL_TABLET | Freq: Every day | ORAL | Status: DC
  Administered 2020-09-27: 40 mg via ORAL
  Filled 2020-09-26: qty 2

## 2020-09-26 MED ORDER — ORAL CARE MOUTH RINSE: 15.0000 mL | Freq: Once | OROMUCOSAL | Status: AC

## 2020-09-26 MED ORDER — BISACODYL 5 MG PO TBEC: 5.0000 mg | DELAYED_RELEASE_TABLET | Freq: Every day | ORAL | Status: DC | PRN

## 2020-09-26 MED ORDER — BUPIVACAINE LIPOSOME 1.3 % IJ SUSP
INTRAMUSCULAR | Status: DC | PRN
  Administered 2020-09-26: 10 mL via PERINEURAL

## 2020-09-26 MED ORDER — LIDOCAINE HCL (CARDIAC) PF 100 MG/5ML IV SOSY
PREFILLED_SYRINGE | INTRAVENOUS | Status: DC | PRN
  Administered 2020-09-26: 20 mg via INTRAVENOUS

## 2020-09-26 MED ORDER — AMLODIPINE BESYLATE 5 MG PO TABS
5.0000 mg | ORAL_TABLET | Freq: Every morning | ORAL | Status: DC
  Administered 2020-09-27: 5 mg via ORAL
  Filled 2020-09-26: qty 1

## 2020-09-26 MED ORDER — FENTANYL CITRATE (PF) 100 MCG/2ML IJ SOLN
INTRAMUSCULAR | Status: AC
  Filled 2020-09-26: qty 2

## 2020-09-26 MED ORDER — ACETAMINOPHEN 500 MG PO TABS
1000.0000 mg | ORAL_TABLET | Freq: Once | ORAL | Status: AC
  Administered 2020-09-26: 1000 mg via ORAL
  Filled 2020-09-26: qty 2

## 2020-09-26 MED ORDER — MENTHOL 3 MG MT LOZG: 1.0000 | LOZENGE | OROMUCOSAL | Status: DC | PRN

## 2020-09-26 MED ORDER — ONDANSETRON HCL 4 MG PO TABS: 4.0000 mg | ORAL_TABLET | Freq: Four times a day (QID) | ORAL | Status: DC | PRN

## 2020-09-26 MED ORDER — ONDANSETRON HCL 4 MG/2ML IJ SOLN
4.0000 mg | Freq: Once | INTRAMUSCULAR | Status: AC | PRN
  Administered 2020-09-26: 4 mg via INTRAVENOUS

## 2020-09-26 MED ORDER — ROCURONIUM BROMIDE 10 MG/ML (PF) SYRINGE
PREFILLED_SYRINGE | INTRAVENOUS | Status: DC | PRN
  Administered 2020-09-26: 100 mg via INTRAVENOUS

## 2020-09-26 MED ORDER — FENTANYL CITRATE (PF) 100 MCG/2ML IJ SOLN
50.0000 ug | Freq: Once | INTRAMUSCULAR | Status: AC
  Administered 2020-09-26: 50 ug via INTRAVENOUS
  Filled 2020-09-26: qty 2

## 2020-09-26 MED ORDER — BUPIVACAINE HCL (PF) 0.5 % IJ SOLN
INTRAMUSCULAR | Status: DC | PRN
  Administered 2020-09-26: 20 mL via PERINEURAL

## 2020-09-26 MED ORDER — VANCOMYCIN HCL 1000 MG IV SOLR
INTRAVENOUS | Status: DC | PRN
  Administered 2020-09-26: 1000 mg

## 2020-09-26 MED ORDER — DIPHENHYDRAMINE HCL 12.5 MG/5ML PO ELIX: 12.5000 mg | ORAL_SOLUTION | ORAL | Status: DC | PRN

## 2020-09-26 MED ORDER — DEXAMETHASONE SODIUM PHOSPHATE 10 MG/ML IJ SOLN
INTRAMUSCULAR | Status: DC | PRN
  Administered 2020-09-26: 10 mg via INTRAVENOUS

## 2020-09-26 MED ORDER — PHENOL 1.4 % MT LIQD: 1.0000 | OROMUCOSAL | Status: DC | PRN

## 2020-09-26 MED ORDER — OXYCODONE HCL 5 MG/5ML PO SOLN: 5.0000 mg | Freq: Once | ORAL | Status: DC | PRN

## 2020-09-26 MED ORDER — DEXAMETHASONE SODIUM PHOSPHATE 10 MG/ML IJ SOLN
INTRAMUSCULAR | Status: AC
  Filled 2020-09-26: qty 1

## 2020-09-26 MED ORDER — OXYCODONE HCL 5 MG PO TABS: 5.0000 mg | ORAL_TABLET | Freq: Once | ORAL | Status: DC | PRN

## 2020-09-26 MED ORDER — METOCLOPRAMIDE HCL 5 MG PO TABS: 5.0000 mg | ORAL_TABLET | Freq: Three times a day (TID) | ORAL | Status: DC | PRN

## 2020-09-26 MED ORDER — ROCURONIUM BROMIDE 10 MG/ML (PF) SYRINGE
PREFILLED_SYRINGE | INTRAVENOUS | Status: AC
  Filled 2020-09-26: qty 10

## 2020-09-26 MED ORDER — ACETAMINOPHEN 325 MG PO TABS
325.0000 mg | ORAL_TABLET | Freq: Four times a day (QID) | ORAL | Status: DC | PRN
  Administered 2020-09-27: 650 mg via ORAL
  Filled 2020-09-26: qty 2

## 2020-09-26 MED ORDER — LIDOCAINE HCL (PF) 2 % IJ SOLN
INTRAMUSCULAR | Status: AC
  Filled 2020-09-26: qty 5

## 2020-09-26 MED ORDER — PROPOFOL 10 MG/ML IV BOLUS
INTRAVENOUS | Status: DC | PRN
  Administered 2020-09-26: 30 mg via INTRAVENOUS
  Administered 2020-09-26 (×2): 20 mg via INTRAVENOUS
  Administered 2020-09-26: 30 mg via INTRAVENOUS
  Administered 2020-09-26: 100 mg via INTRAVENOUS

## 2020-09-26 MED ORDER — STERILE WATER FOR IRRIGATION IR SOLN
Status: DC | PRN
  Administered 2020-09-26: 2000 mL

## 2020-09-26 MED ORDER — METHOCARBAMOL 500 MG PO TABS: 500.0000 mg | ORAL_TABLET | Freq: Four times a day (QID) | ORAL | Status: DC | PRN

## 2020-09-26 SURGICAL SUPPLY — 64 items
ADH SKN CLS APL DERMABOND .7 (GAUZE/BANDAGES/DRESSINGS) ×1
AID PSTN UNV HD RSTRNT DISP (MISCELLANEOUS) ×1
BAG SPEC THK2 15X12 ZIP CLS (MISCELLANEOUS) ×1
BAG ZIPLOCK 12X15 (MISCELLANEOUS) ×2 IMPLANT
BIT DRILL 2.5X110 QC LCP DISP (BIT) ×2 IMPLANT
BIT DRILL PERC QC 2.8X200 100 (BIT) ×1 IMPLANT
COVER SURGICAL LIGHT HANDLE (MISCELLANEOUS) ×2 IMPLANT
COVER WAND RF STERILE (DRAPES) IMPLANT
DERMABOND ADVANCED (GAUZE/BANDAGES/DRESSINGS) ×1
DERMABOND ADVANCED .7 DNX12 (GAUZE/BANDAGES/DRESSINGS) ×1 IMPLANT
DRAPE C-ARM 42X120 X-RAY (DRAPES) ×2 IMPLANT
DRAPE C-ARMOR (DRAPES) IMPLANT
DRAPE ORTHO SPLIT 77X108 STRL (DRAPES) ×4
DRAPE SURG ORHT 6 SPLT 77X108 (DRAPES) ×2 IMPLANT
DRAPE U-SHAPE 47X51 STRL (DRAPES) ×2 IMPLANT
DRILL BIT QUICK COUP 2.8MM 100 (BIT) ×1
DRSG AQUACEL AG ADV 3.5X 6 (GAUZE/BANDAGES/DRESSINGS) IMPLANT
DRSG AQUACEL AG ADV 3.5X10 (GAUZE/BANDAGES/DRESSINGS) IMPLANT
DRSG AQUACEL AG ADV 3.5X14 (GAUZE/BANDAGES/DRESSINGS) ×2 IMPLANT
DURAPREP 26ML APPLICATOR (WOUND CARE) ×2 IMPLANT
ELECT REM PT RETURN 15FT ADLT (MISCELLANEOUS) ×2 IMPLANT
GLOVE BIO SURGEON STRL SZ7.5 (GLOVE) ×2 IMPLANT
GLOVE BIO SURGEON STRL SZ8 (GLOVE) ×2 IMPLANT
GLOVE SS BIOGEL STRL SZ 7 (GLOVE) ×1 IMPLANT
GLOVE SS BIOGEL STRL SZ 7.5 (GLOVE) ×1 IMPLANT
GLOVE SUPERSENSE BIOGEL SZ 7 (GLOVE) ×1
GLOVE SUPERSENSE BIOGEL SZ 7.5 (GLOVE) ×1
GOWN STRL REUS W/TWL LRG LVL3 (GOWN DISPOSABLE) ×4 IMPLANT
KIT BASIN OR (CUSTOM PROCEDURE TRAY) ×2 IMPLANT
KIT TURNOVER KIT A (KITS) ×2 IMPLANT
MANIFOLD NEPTUNE II (INSTRUMENTS) ×2 IMPLANT
NEEDLE TAPERED W/ NITINOL LOOP (MISCELLANEOUS) IMPLANT
NS IRRIG 1000ML POUR BTL (IV SOLUTION) ×2 IMPLANT
PACK SHOULDER (CUSTOM PROCEDURE TRAY) ×2 IMPLANT
PROS LCP PLATE 14 189M (Plate) ×2 IMPLANT
PROSTHESIS LCP PLATE 14 189M (Plate) ×1 IMPLANT
PROTECTOR NERVE ULNAR (MISCELLANEOUS) ×2 IMPLANT
RESTRAINT HEAD UNIVERSAL NS (MISCELLANEOUS) ×2 IMPLANT
SCREW CORTEX 3.5 28MM (Screw) ×2 IMPLANT
SCREW LOCK CORT ST 3.5X28 (Screw) ×2 IMPLANT
SCREW LOCK T15 FT 10X3.5X2.9X (Screw) ×4 IMPLANT
SCREW LOCK T15 FT 12X3.5X2.9X (Screw) ×3 IMPLANT
SCREW LOCK T15 FT 28X3.5X2.9X (Screw) ×3 IMPLANT
SCREW LOCKING 3.5X10 (Screw) ×8 IMPLANT
SCREW LOCKING 3.5X12 (Screw) ×6 IMPLANT
SCREW LOCKING 3.5X28 (Screw) ×6 IMPLANT
SLING ARM FOAM STRAP LRG (SOFTGOODS) ×2 IMPLANT
SLING ARM FOAM STRAP MED (SOFTGOODS) IMPLANT
SLING ARM FOAM STRAP SML (SOFTGOODS) IMPLANT
SPONGE LAP 18X18 RF (DISPOSABLE) ×2 IMPLANT
SUCTION FRAZIER HANDLE 12FR (TUBING) ×1
SUCTION TUBE FRAZIER 12FR DISP (TUBING) ×1 IMPLANT
SUT FIBERTAPE CERCLAGE 2 48 (SUTURE) ×8 IMPLANT
SUT FIBERWIRE #2 38 T-5 BLUE (SUTURE)
SUT MNCRL AB 3-0 PS2 18 (SUTURE) ×2 IMPLANT
SUT MON AB 2-0 CT1 36 (SUTURE) ×4 IMPLANT
SUT VIC AB 1 CT1 27 (SUTURE) ×4
SUT VIC AB 1 CT1 27XBRD ANBCTR (SUTURE) ×2 IMPLANT
SUT VIC AB 1 CT1 36 (SUTURE) ×4 IMPLANT
SUTURE FIBERWR #2 38 T-5 BLUE (SUTURE) IMPLANT
SUTURE TAPE TIGERLINK 1.3MM BL (SUTURE) ×1 IMPLANT
SUTURETAPE TIGERLINK 1.3MM BL (SUTURE) ×2
TOWEL OR 17X26 10 PK STRL BLUE (TOWEL DISPOSABLE) ×2 IMPLANT
TOWEL OR NON WOVEN STRL DISP B (DISPOSABLE) ×2 IMPLANT

## 2020-09-26 NOTE — Anesthesia Procedure Notes (Signed)
Anesthesia Regional Block: Interscalene brachial plexus block   Pre-Anesthetic Checklist: ,, timeout performed, Correct Patient, Correct Site, Correct Laterality, Correct Procedure, Correct Position, site marked, Risks and benefits discussed,  Surgical consent,  Pre-op evaluation,  At surgeon's request and post-op pain management  Laterality: Right  Prep: Maximum Sterile Barrier Precautions used, chloraprep       Needles:  Injection technique: Single-shot  Needle Type: Echogenic Stimulator Needle     Needle Length: 9cm  Needle Gauge: 22     Additional Needles:   Procedures:,,,, ultrasound used (permanent image in chart),,,,  Narrative:  Start time: 09/26/2020 11:17 AM End time: 09/26/2020 11:27 AM Injection made incrementally with aspirations every 5 mL.  Performed by: Personally  Anesthesiologist: Lannie Fields, DO  Additional Notes: Monitors applied. No increased pain on injection. No increased resistance to injection. Injection made in 5cc increments. Good needle visualization. Patient tolerated procedure well.

## 2020-09-26 NOTE — Anesthesia Preprocedure Evaluation (Addendum)
Anesthesia Evaluation  Patient identified by MRN, date of birth, ID band Patient awake    Reviewed: Allergy & Precautions, H&P , NPO status , Patient's Chart, lab work & pertinent test results  Airway Mallampati: II  TM Distance: >3 FB Neck ROM: Full    Dental  (+) Missing, Chipped, Dental Advisory Given,    Pulmonary former smoker,  Snores at night, never had sleep study Thick beard   Pulmonary exam normal breath sounds clear to auscultation       Cardiovascular hypertension, Pt. on medications Normal cardiovascular exam Rhythm:Regular Rate:Normal  Per pt, BP normally lower. 169/72 this morning in preop   Neuro/Psych CVA negative psych ROS   GI/Hepatic Neg liver ROS, GERD  Medicated and Controlled,  Endo/Other  diabetes, Well Controlled, Type 2, Oral Hypoglycemic AgentsObesity BMI 30  Renal/GU negative Renal ROS  negative genitourinary   Musculoskeletal Right humerus periprosthetic fx   Abdominal (+) + obese,   Peds negative pediatric ROS (+)  Hematology negative hematology ROS (+) hct 37.8, plt 194   Anesthesia Other Findings   Reproductive/Obstetrics negative OB ROS                            Anesthesia Physical Anesthesia Plan  ASA: III  Anesthesia Plan: General and Regional   Post-op Pain Management: GA combined w/ Regional for post-op pain   Induction: Intravenous  PONV Risk Score and Plan: 2 and Ondansetron, Dexamethasone and Treatment may vary due to age or medical condition  Airway Management Planned: Oral ETT  Additional Equipment: None  Intra-op Plan:   Post-operative Plan: Extubation in OR  Informed Consent: I have reviewed the patients History and Physical, chart, labs and discussed the procedure including the risks, benefits and alternatives for the proposed anesthesia with the patient or authorized representative who has indicated his/her understanding and  acceptance.     Dental advisory given  Plan Discussed with: CRNA  Anesthesia Plan Comments:        Anesthesia Quick Evaluation

## 2020-09-26 NOTE — Discharge Instructions (Signed)
Vania Rea. Supple, M.D., F.A.A.O.S. Orthopaedic Surgery Specializing in Arthroscopic and Reconstructive Surgery of the Shoulder [phone] [address] [place], [place] [address] - Fax [phone]   POST-OP   1. Follow up in the office for your first post-op appointment 10-14 days from the date of your surgery. If you do not already have a scheduled appointment, our office will contact you to schedule.  2. The bandage over your incision is waterproof. You may begin showering with this dressing on. You may leave this dressing on until first follow up appointment within 2 weeks. We prefer you leave this dressing in place until follow up however after 5-7 days if you are having itching or skin irritation and would like to remove it you may do so. Go slow and tug at the borders gently to break the bond the dressing has with the skin. At this point if there is no drainage it is okay to go without a bandage or you may cover it with a light guaze and tape. You can also expect significant bruising around your shoulder that will drift down your arm and into your chest wall. This is very normal and should resolve over several days.   3. Wear your sling/immobilizer at all times except to perform the exercises below or to occasionally let your arm dangle by your side to stretch your elbow. You also need to sleep in your sling immobilizer until instructed otherwise. It is ok to remove your sling if you are sitting in a controlled environment and allow your arm to rest in a position of comfort by your side or on your lap with pillows to give your neck and skin a break from the sling. You may remove it to allow arm to dangle by side to shower. If you are up walking around and when you go to sleep at night you need to wear it.  4. Range of motion to your elbow, wrist, and hand are encouraged 3-5 times daily. Exercise to your hand and fingers helps to reduce swelling you may experience.   5.  Prescriptions for a pain medication and a muscle relaxant are provided for you. It is recommended that if you are experiencing pain that you pain medication alone is not controlling, add the muscle relaxant along with the pain medication which can give additional pain relief. The first 1-2 days is generally the most severe of your pain and then should gradually decrease. As your pain lessens it is recommended that you decrease your use of the pain medications to an "as needed basis'" only and to always comply with the recommended dosages of the pain medications.  6. Pain medications can produce constipation along with their use. If you experience this, the use of an over the counter stool softener or laxative daily is recommended.   7. For additional questions or concerns, please do not hesitate to call the office. If after hours there is an answering service to forward your concerns to the physician on call.  8.Pain control following an exparel block  To help control your post-operative pain you received a nerve block  performed with Exparel which is a long acting anesthetic (numbing agent) which can provide pain relief and sensations of numbness (and relief of pain) in the operative shoulder and arm for up to 3 days. Sometimes it provides mixed relief, meaning you may still have numbness in certain areas of the arm but can still be able to move  parts  of that arm, hand, and fingers. We recommend that your prescribed pain medications  be used as needed. We do not feel it is necessary to "pre medicate" and "stay ahead" of pain.  Taking narcotic pain medications when you are not having any pain can lead to unnecessary and potentially dangerous side effects.    9. Use ice to upper arm     POST-OP EXERCISES  Ok to allow arm to dangle and to move it for hygiene. Sling on but can remove to allow arm to dangle and move elbow wrist and hand.

## 2020-09-26 NOTE — Transfer of Care (Signed)
Immediate Anesthesia Transfer of Care Note  Patient: [name] Soto  Procedure(s) Performed: Open reduction internal fixation right humeral periprosthetic fracture (Right Shoulder)  Patient Location: PACU  Anesthesia Type:GA combined with regional for post-op pain  Level of Consciousness: awake, drowsy and patient cooperative  Airway & Oxygen Therapy: Patient Spontanous Breathing and Patient connected to face mask oxygen  Post-op Assessment: Report given to RN and Post -op Vital signs reviewed and stable  Post vital signs: Reviewed and stable  Last Vitals:  Vitals Value Taken Time  BP 135/59 09/26/20 1603  Temp    Pulse 80 09/26/20 1605  Resp 16 09/26/20 1605  SpO2 100 % 09/26/20 1605  Vitals shown include unvalidated device data.  Last Pain:  Vitals:   09/26/20 1137  TempSrc:   PainSc: 0-No pain         Complications: No complications documented.

## 2020-09-26 NOTE — H&P (Signed)
[name] Soto    Chief Complaint: Right humerus periprosthetic fracture HPI: The patient is a 75 y.o. male who is status post a right shoulder reverse arthroplasty for the treatment of a comminuted and displaced four-part proximal humerus fracture back in 2017.  Postoperatively [name] Soto had done exceptionally well reporting that he had regained full function of the right shoulder with no specific functional imitations.  He unfortunately fell onto the outstretched right upper extremity proximally 2 weeks ago sustaining a severely displaced periprosthetic fracture of the right humeral shaft.  Due to the nature of the injuries and the severe instability and displacement he is brought to the operating room at this time for planned open reduction and internal fixation with possible revision of his implant.  Past Medical History:  Diagnosis Date  . Cerebral thrombosis with cerebral infarction (HCC)   . Diabetes mellitus without complication (HCC)   . GERD (gastroesophageal reflux disease)   . Humerus fracture    PROXIMAL RIGHT  . Hypertension   . Pancreatic lesion   . Stroke [facility])     Past Surgical History:  Procedure Laterality Date  . CATARACT EXTRACTION     RIGHT  . EUS N/A 07/17/2015   Procedure: ESOPHAGEAL ENDOSCOPIC ULTRASOUND (EUS) RADIAL;  Surgeon: Willis Modena, MD;  Location: [facility] ENDOSCOPY;  Service: Endoscopy;  Laterality: N/A;  . FINE NEEDLE ASPIRATION N/A 07/17/2015   Procedure: FINE NEEDLE ASPIRATION (FNA) RADIAL;  Surgeon: Willis Modena, MD;  Location: [facility] ENDOSCOPY;  Service: Endoscopy;  Laterality: N/A;  . HEMATOMA DRAINED    . HERNIA REPAIR     inguinal right  . HYDROCELE EXCISION    . REVERSE SHOULDER ARTHROPLASTY Right 05/07/2016   Procedure: RIGHT REVERSE TOTAL SHOULDER ARTHROPLASTY;  Surgeon: Francena Hanly, MD;  Location: [facility] OR;  Service: Orthopedics;  Laterality: Right;    History reviewed. No pertinent family history.  Social History:  reports that he has quit  smoking. His smoking use included cigarettes. He has never used smokeless tobacco. He reports that he does not drink alcohol and does not use drugs.   Medications Prior to Admission  Medication Sig Dispense Refill  . amLODipine (NORVASC) 5 MG tablet Take 5 mg by mouth every morning.    [name] aspirin 81 MG tablet Take 81 mg by mouth daily.    [name] atorvastatin (LIPITOR) 40 MG tablet Take 40 mg by mouth daily.    [name] doxycycline (MONODOX) 100 MG capsule Take 100 mg by mouth 2 (two) times daily.    [name] lisinopril (PRINIVIL,ZESTRIL) 40 MG tablet Take 40 mg by mouth daily.    . metFORMIN (GLUCOPHAGE) 500 MG tablet Take 500 mg by mouth 2 (two) times daily with a meal.    . Multiple Vitamin (MULTI-VITAMINS) TABS Take 1 tablet by mouth daily.    [name] zinc gluconate 50 MG tablet Take 50 mg by mouth daily.       Physical Exam: [name] Soto is a well-developed, healthy-appearing gentleman in obvious discomfort secondary to right upper extremity pain.  The right upper arm is protected in a coaptation splint.  Moderate swelling noted of the forearm wrist and hand.  His in office evaluation demonstrated that he was neurovascularly intact.  Plain radiographs were reviewed which show a significantly displaced long oblique fracture traversing the mid third of the humeral shaft and exiting just past the distal tip of the humeral stem.  Vitals  Temp:  [98.4 F (36.9 C)] 98.4 F (36.9 C) (01/13 1039) Pulse Rate:  [  55-74] 55 (01/13 1149) Resp:  [15-30] 21 (01/13 1149) BP: (143-169)/(58-74) 143/58 (01/13 1144) SpO2:  [100 %] 100 % (01/13 1149) Weight:  [96.2 kg] 96.2 kg (01/13 1045)  Assessment/Plan  Impression: Right humerus periprosthetic fracture  Plan of Action: Procedure(s): Open reduction internal fixation right humeral periprosthetic fracture vs revision humeral implant and open reduction internal fixation  Gaspare Netzel M Armie Moren 09/26/2020, 12:01 PM Contact # [phone]

## 2020-09-26 NOTE — Op Note (Signed)
09/26/2020  3:36 PM  PATIENT:   [name] Soto  75 y.o. male  PRE-OPERATIVE DIAGNOSIS:  Right humerus periprosthetic fracture  POST-OPERATIVE DIAGNOSIS: Same  PROCEDURE: Open reduction and internal fixation of right periprosthetic humeral shaft fracture  SURGEON:  Colon Rueth, Vania Rea M.D.  ASSISTANTS: Ralene Bathe, PA-C  ANESTHESIA:   General endotracheal and interscalene block with Exparel  EBL: 250 cc  SPECIMEN: None  Drains: None   PATIENT DISPOSITION:  PACU - hemodynamically stable.    PLAN OF CARE: Admit for overnight observation  Brief history:  [name] Soto is a 75 year old gentleman who back in 2017 fell sustaining a comminuted four-part proximal humerus fracture for which I had performed a reverse shoulder arthroplasty.  He had done very well until 2 weeks ago which time he unfortunately fell sustaining a severely displaced midshaft periprosthetic humerus fracture.  He is brought to the operating room at this time for planned open reduction and internal fixation.  Preoperatively he was counseled regarding the treatment options as well as the potential risks versus benefits thereof.  Possible surgical complications were reviewed including potential for bleeding, infection, neurovascular injury, persistence of pain, malunion, nonunion, loss of fixation, possible need for additional surgery.  He understands, and accepts, and agrees with the planned procedure.  Procedure in detail:  After undergoing routine preop evaluation the patient received prophylactic antibiotics and interscalene block with Exparel was established in the holding area by the anesthesia department.  Patient was subsequently placed supine on the operating table and underwent the smooth induction of a general endotracheal anesthesia.  Placed into a modified beachchair position and appropriately padded and protected.  The right shoulder girdle region was sterilely prepped and draped in standard fashion.  Timeout  was called.  An anterior longitudinal incision approximately 30 cm in length was then made from the anterior aspect of the right shoulder along his previous shoulder incision and extending distally in the midline anteriorly to allow exposure of the humeral shaft through an anterior [facility] approach.  Sharp dissection carried through the skin and electrocautery was used hemostasis.  Proximal aspect of the incision was carried down through the deltopectoral interval which was quite scarred related to the previous surgery.  This interval was then carefully developed distally and then the distal aspect of the incision split the anterior fascia with the bicep muscular retracted medially and the brachialis was then divided in the midline over the anterior cortex of the humeral shaft.  Subperiosteal dissection was then used to expose the anterolateral cortex of the humeral shaft from the metaphyseal region down across the fracture site and then distally for an appropriate length to allow placement of a plate that allow at least 3-4 holes distal to the fracture site.  The fracture site was then carefully exposed and explored and there was evidence for some comminution distally as well as a separate fracture fragment proximally at the ends of the fracture the majority of this was a long oblique pattern.  The previously placed bone plug was in the distal segment and this was easily retrieved.  Interposed soft tissue the patch site was then carefully removed.  We ultimately gain visualization of the margins of the fracture and under direct visualization we manipulated the fracture site into an anatomic reduction and held this with a bone clamp.  We then assessed the length of the plate that would be required and ultimately selected a 14 hole Synthes 3.5 DC plate.  This was appropriately contoured and then provisionally  fastened with 2 screws distally and then a cerclage suture tape proximally.  Fluoroscopic imaging showed good  position and good alignment.  We then ultimately used a series of 4 fiber tape cerclage wires proximally as well as 6 unicortical locking screws to fastened to the proximal aspect of the fracture and then distally we started with 2 standard lag screws and then placed 4 locking screws more distally once the reduction had been achieved and overall were very pleased with the fixation and the stability of the construct.  At this point final fluoroscopic images were obtained showing good alignment at the fracture site good position of the hardware.  The wound was then copiously irrigated.  Hemostasis was obtained.  Gram of vancomycin powder was then sprayed liberally through the deep and superficial soft tissue planes.  The brachialis was then repaired in a running fashion over the anterior humeral shaft with a #1 Vicryl.  The distal deltopectoral attachments were reapproximated with figure-of-eight 0 Vicryl sutures.  2-0 Vicryl used to close the anterior compartment deep fascial layer in a running fashion and the deltopectoral interval was reapproximated with interrupted figure-of-eight #1 Vicryl sutures.  2-0 Monocryl used to close the subtendinous layer and intracuticular 3-0 Monocryl for the skin followed by Dermabond and Aquacel dressing and the right arm was placed in a sling and patient was awakened, extubated, and taken to the recovery in stable condition.   Ralene Bathe, PA-C was utilized as an Geophysicist/field seismologist throughout this case, essential for help with positioning the patient, positioning extremity, tissue manipulation, implantation of the prosthesis, suture management, wound closure, and intraoperative decision-making.  Senaida Lange MD    Contact # [phone]

## 2020-09-26 NOTE — Anesthesia Procedure Notes (Signed)
Procedure Name: Intubation Date/Time: 09/26/2020 12:53 PM Performed by: Raenette Rover, CRNA Pre-anesthesia Checklist: Patient identified, Emergency Drugs available, Suction available and Patient being monitored Patient Re-evaluated:Patient Re-evaluated prior to induction Oxygen Delivery Method: Circle system utilized Preoxygenation: Pre-oxygenation with 100% oxygen Induction Type: IV induction Ventilation: Mask ventilation without difficulty and Two handed mask ventilation required Laryngoscope Size: Mac and 4 Grade View: Grade I Tube type: Oral Tube size: 7.5 mm Number of attempts: 1 Airway Equipment and Method: Stylet Placement Confirmation: ETT inserted through vocal cords under direct vision,  positive ETCO2 and breath sounds checked- equal and bilateral Secured at: 23 cm Tube secured with: Tape Dental Injury: Teeth and Oropharynx as per pre-operative assessment

## 2020-09-26 NOTE — Anesthesia Postprocedure Evaluation (Signed)
Anesthesia Post Note  Patient: [name] Soto  Procedure(s) Performed: Open reduction internal fixation right humeral periprosthetic fracture (Right Shoulder)     Patient location during evaluation: PACU Anesthesia Type: Regional Level of consciousness: awake and alert Pain management: pain level controlled Vital Signs Assessment: post-procedure vital signs reviewed and stable Respiratory status: spontaneous breathing, nonlabored ventilation and respiratory function stable Cardiovascular status: blood pressure returned to baseline and stable Postop Assessment: no apparent nausea or vomiting Anesthetic complications: no   No complications documented.  Last Vitals:  Vitals:   09/26/20 1700 09/26/20 1715  BP: 139/73 137/69  Pulse: 90 88  Resp: 16 16  Temp: 36.4 C   SpO2: 100% 98%    Last Pain:  Vitals:   09/26/20 1700  TempSrc:   PainSc: 0-No pain                 Lowella Curb

## 2020-09-26 NOTE — Plan of Care (Signed)
Patient admitted to unit careplans initiated  

## 2020-09-26 NOTE — Progress Notes (Signed)
Assisted Dr. Beth Finucane with right, ultrasound guided, interscalene  block. Side rails up, monitors on throughout procedure. See vital signs in flow sheet. Tolerated Procedure well.  

## 2020-09-27 ENCOUNTER — Encounter (HOSPITAL_COMMUNITY): Payer: Self-pay | Admitting: Orthopedic Surgery

## 2020-09-27 DIAGNOSIS — S42301A Unspecified fracture of shaft of humerus, right arm, initial encounter for closed fracture: Secondary | ICD-10-CM | POA: Diagnosis not present

## 2020-09-27 LAB — GLUCOSE, CAPILLARY: Glucose-Capillary: 106 mg/dL — ABNORMAL HIGH (ref 70–99)

## 2020-09-27 MED ORDER — HYDROCODONE-ACETAMINOPHEN 5-325 MG PO TABS: 1.0000 | ORAL_TABLET | ORAL | 0 refills | Status: DC | PRN

## 2020-09-27 NOTE — Plan of Care (Signed)
Pt dc'd all care plans met  

## 2020-09-27 NOTE — Discharge Summary (Signed)
PATIENT ID:      [name] Soto  MRN:     [id number] DOB/AGE:    1946-05-27 / 75 y.o.     DISCHARGE SUMMARY  ADMISSION DATE:    09/26/2020 DISCHARGE DATE:    ADMISSION DIAGNOSIS: Right humerus periprosthetic fracture Past Medical History:  Diagnosis Date  . Cerebral thrombosis with cerebral infarction (HCC)   . Diabetes mellitus without complication (HCC)   . GERD (gastroesophageal reflux disease)   . Humerus fracture    PROXIMAL RIGHT  . Hypertension   . Pancreatic lesion   . Stroke [facility]     DISCHARGE DIAGNOSIS:   Active Problems:   S/P ORIF (open reduction internal fixation) fracture   PROCEDURE: Procedure(s): Open reduction internal fixation right humeral periprosthetic fracture on 09/26/2020  CONSULTS:    HISTORY:  See H&P in chart.  HOSPITAL COURSE:  [name] Soto is a 75 y.o. admitted on 09/26/2020 with a diagnosis of Right humerus periprosthetic fracture.  They were brought to the operating room on 09/26/2020 and underwent Procedure(s): Open reduction internal fixation right humeral periprosthetic fracture.    They were given perioperative antibiotics:  Anti-infectives (From admission, onward)   Start     Dose/Rate Route Frequency Ordered Stop   09/26/20 2200  doxycycline (VIBRA-TABS) tablet 100 mg        100 mg Oral 2 times daily 09/26/20 1822     09/26/20 1508  vancomycin (VANCOCIN) powder  Status:  Discontinued          As needed 09/26/20 1508 09/26/20 1752   09/26/20 1030  ceFAZolin (ANCEF) IVPB 2g/100 mL premix        2 g 200 mL/hr over 30 Minutes Intravenous On call to O.R. 09/26/20 1019 09/26/20 1327    .  Patient underwent the above named procedure and tolerated it well. The following day they were hemodynamically stable and pain was controlled on oral analgesics. They were neurovascularly intact to the operative extremity. OT was ordered and worked with patient per protocol. They were medically and orthopaedically stable for discharge on .    DIAGNOSTIC  STUDIES:  RECENT RADIOGRAPHIC STUDIES :  DG Humerus Right  Result Date: 09/26/2020 CLINICAL DATA:  Right humerus ORIF EXAM: RIGHT HUMERUS - 2+ VIEW; DG C-ARM 1-60 MIN-NO REPORT COMPARISON:  04/27/2016 FINDINGS: 6 C-arm fluoroscopic images were obtained intraoperatively and submitted for post operative interpretation. Periprosthetic fracture adjacent to the humeral component of reverse right shoulder arthroplasty. Subsequent fluoroscopic images demonstrate placement of sideplate and screw fixation construct traversing the fracture site. Alignment appears near anatomic. 25 seconds of fluoroscopy time was utilized. Please see the performing provider's procedural report for further detail. IMPRESSION: As above. Electronically Signed   By: Duanne Guess D.O.   On: 09/26/2020 15:24   DG C-Arm 1-60 Min-No Report  Result Date: 09/26/2020 CLINICAL DATA:  Right humerus ORIF EXAM: RIGHT HUMERUS - 2+ VIEW; DG C-ARM 1-60 MIN-NO REPORT COMPARISON:  04/27/2016 FINDINGS: 6 C-arm fluoroscopic images were obtained intraoperatively and submitted for post operative interpretation. Periprosthetic fracture adjacent to the humeral component of reverse right shoulder arthroplasty. Subsequent fluoroscopic images demonstrate placement of sideplate and screw fixation construct traversing the fracture site. Alignment appears near anatomic. 25 seconds of fluoroscopy time was utilized. Please see the performing provider's procedural report for further detail. IMPRESSION: As above. Electronically Signed   By: Duanne Guess D.O.   On: 09/26/2020 15:24    RECENT VITAL SIGNS:   Patient Vitals for the past 24 hrs:  BP Temp Temp src Pulse Resp SpO2 Height Weight  09/27/20 0600 (!) 129/58 98 F (36.7 C) Oral 83 17 96 % -- --  09/27/20 0030 (!) 148/73 97.8 F (36.6 C) Oral 90 17 100 % -- --  09/26/20 2136 131/65 98.1 F (36.7 C) -- 92 17 100 % -- --  09/26/20 2018 138/76 98 F (36.7 C) -- 92 16 98 % -- --  09/26/20 1918  135/71 -- -- 91 20 98 % -- --  09/26/20 1811 136/67 97.6 F (36.4 C) Oral 94 16 -- 5\' 10"  (1.778 m) 96.2 kg  09/26/20 1756 136/67 97.6 F (36.4 C) Oral 94 16 97 % -- --  09/26/20 1745 132/74 97.6 F (36.4 C) -- 92 (!) 25 100 % -- --  09/26/20 1730 136/71 -- -- 90 19 99 % -- --  09/26/20 1715 137/69 -- -- 88 16 98 % -- --  09/26/20 1700 139/73 97.6 F (36.4 C) -- 90 16 100 % -- --  09/26/20 1645 (!) 150/71 -- -- 92 (!) 28 100 % -- --  09/26/20 1630 (!) 141/69 -- -- 93 (!) 26 100 % -- --  09/26/20 1615 129/80 -- -- 95 (!) 26 100 % -- --  09/26/20 1603 (!) 135/59 97.6 F (36.4 C) -- 92 17 100 % -- --  09/26/20 1149 -- -- -- (!) 55 (!) 21 100 % -- --  09/26/20 1144 (!) 143/58 -- -- (!) 56 (!) 30 100 % -- --  09/26/20 1139 -- -- -- (!) 59 16 100 % -- --  09/26/20 1137 (!) 144/69 -- -- 64 19 100 % -- --  09/26/20 1134 -- -- -- 66 20 100 % -- --  09/26/20 1129 (!) 148/74 -- -- 70 (!) 23 100 % -- --  09/26/20 1125 (!) 151/73 -- -- 74 16 100 % -- --  09/26/20 1124 -- -- -- -- 15 -- -- --  09/26/20 1045 -- -- -- -- -- -- -- 96.2 kg  09/26/20 1039 (!) 169/72 98.4 F (36.9 C) Oral 61 18 100 % -- --  .  RECENT EKG RESULTS:    Orders placed or performed during the hospital encounter of 09/20/20  . EKG 12 lead per protocol  . EKG 12 lead per protocol    DISCHARGE INSTRUCTIONS:    DISCHARGE MEDICATIONS:   Allergies as of 09/27/2020   No Known Allergies     Medication List    TAKE these medications   amLODipine 5 MG tablet Commonly known as: NORVASC Take 5 mg by mouth every morning.   aspirin 81 MG tablet Take 81 mg by mouth daily.   atorvastatin 40 MG tablet Commonly known as: LIPITOR Take 40 mg by mouth daily.   doxycycline 100 MG capsule Commonly known as: MONODOX Take 100 mg by mouth 2 (two) times daily.   HYDROcodone-acetaminophen 5-325 MG tablet Commonly known as: Norco Take 1 tablet by mouth every 4 (four) hours as needed.   lisinopril 40 MG tablet Commonly  known as: ZESTRIL Take 40 mg by mouth daily.   metFORMIN 500 MG tablet Commonly known as: GLUCOPHAGE Take 500 mg by mouth 2 (two) times daily with a meal.   Multi-Vitamins Tabs Take 1 tablet by mouth daily.   zinc gluconate 50 MG tablet Take 50 mg by mouth daily.       FOLLOW UP VISIT:    Follow-up Information    09/29/2020, MD.   Specialty: Orthopedic Surgery  Why: in 10-14 days, we will call you with appt Contact information: [address] [place] [address] [phone]               DISCHARGE TO:[facility] Home   DISCHARGE CONDITION:  Stable     Ralene Bathe for Dr. Francena Hanly 09/27/2020, 7:47 AM

## 2020-09-27 NOTE — Evaluation (Signed)
Occupational Therapy Evaluation Patient Details Name: [name] Soto MRN: [id number] DOB: May 10, 1946 Today's Date: 09/27/2020    History of Present Illness The patient is a 75 y.o. male who is status post a right shoulder reverse arthroplasty for the treatment of a comminuted and displaced four-part proximal humerus fracture back in 2017.  He unfortunately fell onto the outstretched right upper extremity proximally 2 weeks ago sustaining a severely displaced periprosthetic fracture of the right humeral shaft.  Patinet now s/p ORIF.   Clinical Impression   [name] Soto is a 75 year old man s/p s/p shoulder replacement without functional use of right dominant upper extremity secondary to effects of surgery, nterscalene block and shoulder precautions. Therapist provided education and instruction to patient in regards to exercises, precautions, positioning, donning upper extremity clothing and bathing while maintaining shoulder precautions, ice and edema management and donning/doffing sling. Patient verbalized understanding. Patient reports sometimes he doesn't remember things. Therapist provided handouts to maximize retention of instructions and education. Discussed compensatory strategies for ADLs. Patient and spouse may need reiteration of education while in hospital and OT will treat and follow acutely as needed. Patient to follow up with MD for further therapy needs.      Follow Up Recommendations  Follow surgeon's recommendation for DC plan and follow-up therapies    Equipment Recommendations  None recommended by OT    Recommendations for Other Services       Precautions / Restrictions Precautions Precautions: Shoulder Shoulder Interventions: Shoulder sling/immobilizer;At all times;Off for dressing/bathing/exercises Precaution Comments: PROM 10 ER, 45 ABD,60 FE , PASSIVE ROM FOR ADL's ONLY, NOT for EXERCISES. OK to exercise elbow wrist and hand rom and for edema control   No pendulums,  may allow arm to dangle   Pt may shower Restrictions Weight Bearing Restrictions: Yes RUE Weight Bearing: Non weight bearing      Mobility Bed Mobility Overal bed mobility: Needs Assistance Bed Mobility: Supine to Sit     Supine to sit: Mod assist     General bed mobility comments: MOd assist to transfer to side of bed. Patient will be sleeping in recliner at home.    Transfers Overall transfer level: Needs assistance Equipment used: 1 person hand held assist Transfers: Sit to/from [employer] Sit to Stand: Min assist Stand pivot transfers: Min assist            Balance Overall balance assessment: Mild deficits observed, not formally tested                                         ADL either performed or assessed with clinical judgement   ADL Overall ADL's : Needs assistance/impaired Eating/Feeding: Set up   Grooming: Modified independent   Upper Body Bathing: Minimal assistance;Sitting   Lower Body Bathing: Moderate assistance;Sit to/from stand   Upper Body Dressing : Maximal assistance;Sitting;Adhering to UE precautions   Lower Body Dressing: Maximal assistance;Sit to/from stand   Toilet Transfer: Minimal Advertising account executive and Hygiene: Minimal assistance;Sit to/from stand Toileting - Clothing Manipulation Details (indicate cue type and reason): Discussed use of urinal at home or use of brief and types of clothing - to limit urgency and unsafe movement to get to toilet.             Vision Baseline Vision/History: Wears glasses Wears Glasses: Reading only Patient Visual Report: No change from  baseline       Perception     Praxis      Pertinent Vitals/Pain Pain Assessment: No/denies pain     Hand Dominance Right   Extremity/Trunk Assessment Upper Extremity Assessment Upper Extremity Assessment: RUE deficits/detail;LUE deficits/detail RUE Deficits /  Details: Grossly functional ROM of forearm, wrist and fingers. Elbow still limited by block. Shoulder limited by block and shoulder precautions. LUE Deficits / Details: WFL ROM and strength   Lower Extremity Assessment Lower Extremity Assessment: Overall WFL for tasks assessed       Communication Communication Communication: No difficulties   Cognition Arousal/Alertness: Awake/alert Behavior During Therapy: WFL for tasks assessed/performed Overall Cognitive Status: Within Functional Limits for tasks assessed                                 General Comments: Reports some memory deficits.   General Comments       Exercises     Shoulder Instructions Shoulder Instructions Donning/doffing shirt without moving shoulder: Maximal assistance Method for sponge bathing under operated UE: Independent Donning/doffing sling/immobilizer: Maximal assistance Correct positioning of sling/immobilizer: Independent ROM for elbow, wrist and digits of operated UE: Independent Sling wearing schedule (on at all times/off for ADL's): Independent Proper positioning of operated UE when showering: Independent Dressing change: Independent Positioning of UE while sleeping: Independent    Home Living Family/patient expects to be discharged to:: Private residence Living Arrangements: Spouse/significant other Available Help at Discharge: Family;Available 24 hours/day Type of Home: House       Home Layout: One level     Bathroom Shower/Tub: Producer, television/film/video: Standard     Home Equipment: Cane - single point;Shower seat          Prior Functioning/Environment Level of Independence: Independent                 OT Problem List: Decreased strength;Decreased range of motion;Impaired balance (sitting and/or standing);Pain;Impaired UE functional use      OT Treatment/Interventions: Self-care/ADL training;Therapeutic exercise;DME and/or AE instruction;Therapeutic  activities;Balance training;Patient/family education    OT Goals(Current goals can be found in the care plan section) Acute Rehab OT Goals Patient Stated Goal: to not fall OT Goal Formulation: With patient Time For Goal Achievement: 10/11/20 Potential to Achieve Goals: Good  OT Frequency: Min 2X/week   Barriers to D/C:            Co-evaluation              AM-PAC OT "6 Clicks" Daily Activity     Outcome Measure Help from another person eating meals?: A Little Help from another person taking care of personal grooming?: A Little Help from another person toileting, which includes using toliet, bedpan, or urinal?: A Little Help from another person bathing (including washing, rinsing, drying)?: A Lot Help from another person to put on and taking off regular upper body clothing?: A Lot Help from another person to put on and taking off regular lower body clothing?: A Lot 6 Click Score: 15   End of Session Nurse Communication: Mobility status  Activity Tolerance: Patient tolerated treatment well Patient left: in chair;with call bell/phone within reach  OT Visit Diagnosis: Unsteadiness on feet (R26.81);Muscle weakness (generalized) (M62.81);Pain Pain - Right/Left: Right Pain - part of body: Shoulder                Time: 9518-8416 OT Time Calculation (min): 26 min Charges:  OT General Charges $OT Visit: 1 Visit OT Treatments $Self Care/Home Management : 8-22 mins  Lummie Montijo, OTR/L Acute Care Rehab Services  Office [phone] Pager: [phone]   Kelli Churn 09/27/2020, 10:12 AM

## 2020-09-27 NOTE — Plan of Care (Signed)
  Problem: Activity: Goal: Risk for activity intolerance will decrease 09/27/2020 0726 by Beverly Sessions, RN Outcome: Progressing 09/26/2020 1804 by Beverly Sessions, RN Outcome: Progressing   Problem: Coping: Goal: Level of anxiety will decrease 09/27/2020 0726 by Beverly Sessions, RN Outcome: Progressing 09/26/2020 1804 by Beverly Sessions, RN Outcome: Progressing   Problem: Pain Managment: Goal: General experience of comfort will improve 09/27/2020 0726 by Beverly Sessions, RN Outcome: Progressing 09/26/2020 1804 by Beverly Sessions, RN Outcome: Progressing

## 2020-10-07 NOTE — Addendum Note (Signed)
Addendum  created 10/07/20 0646 by Lowella Curb, MD   Intraprocedure Staff edited

## 2020-11-18 ENCOUNTER — Ambulatory Visit: Payer: Medicare Other | Attending: Orthopedic Surgery | Admitting: Physical Therapy

## 2020-11-18 ENCOUNTER — Encounter: Payer: Self-pay | Admitting: Physical Therapy

## 2020-11-18 ENCOUNTER — Other Ambulatory Visit: Payer: Self-pay

## 2020-11-18 DIAGNOSIS — M6281 Muscle weakness (generalized): Secondary | ICD-10-CM | POA: Diagnosis present

## 2020-11-18 DIAGNOSIS — R2681 Unsteadiness on feet: Secondary | ICD-10-CM | POA: Insufficient documentation

## 2020-11-18 DIAGNOSIS — M25611 Stiffness of right shoulder, not elsewhere classified: Secondary | ICD-10-CM | POA: Diagnosis present

## 2020-11-18 DIAGNOSIS — Z9181 History of falling: Secondary | ICD-10-CM | POA: Insufficient documentation

## 2020-11-18 DIAGNOSIS — M25511 Pain in right shoulder: Secondary | ICD-10-CM

## 2020-11-18 NOTE — Therapy (Signed)
[facility] [address]  [address] [place], [place], [address] Phone: [phone]   Fax:  [phone]  Physical Therapy Evaluation  Patient Details  Name: [name] Soto MRN: [id number] Date of Birth: [birth year] Referring Provider (PT): Francena Hanly, MD   Encounter Date: 11/18/2020   PT End of Session - 11/18/20 1207    Visit Number 1    Number of Visits 17    Date for PT Re-Evaluation 01/13/21    Authorization Type Medicare & BCBS    PT Start Time 1025   pt late   PT Stop Time 1103    PT Time Calculation (min) 38 min    Activity Tolerance Patient tolerated treatment well    Behavior During Therapy [facility] for tasks assessed/performed           Past Medical History:  Diagnosis Date  . Cerebral thrombosis with cerebral infarction (HCC)   . Diabetes mellitus without complication (HCC)   . GERD (gastroesophageal reflux disease)   . Humerus fracture    PROXIMAL RIGHT  . Hypertension   . Pancreatic lesion   . Stroke [facility])     Past Surgical History:  Procedure Laterality Date  . CATARACT EXTRACTION     RIGHT  . EUS N/A 07/17/2015   Procedure: ESOPHAGEAL ENDOSCOPIC ULTRASOUND (EUS) RADIAL;  Surgeon: Willis Modena, MD;  Location: [facility] ENDOSCOPY;  Service: Endoscopy;  Laterality: N/A;  . FINE NEEDLE ASPIRATION N/A 07/17/2015   Procedure: FINE NEEDLE ASPIRATION (FNA) RADIAL;  Surgeon: Willis Modena, MD;  Location: [facility] ENDOSCOPY;  Service: Endoscopy;  Laterality: N/A;  . HEMATOMA DRAINED    . HERNIA REPAIR     inguinal right  . HYDROCELE EXCISION    . ORIF HUMERUS FRACTURE Right 09/26/2020   Procedure: Open reduction internal fixation right humeral periprosthetic fracture;  Surgeon: Francena Hanly, MD;  Location: [facility] ORS;  Service: Orthopedics;  Laterality: Right;   . REVERSE SHOULDER ARTHROPLASTY Right 05/07/2016   Procedure: RIGHT REVERSE TOTAL SHOULDER ARTHROPLASTY;  Surgeon: Francena Hanly, MD;  Location: [facility] OR;  Service:  Orthopedics;  Laterality: Right;    There were no vitals filed for this visit.    Subjective Assessment - 11/18/20 1028    Subjective Patient reports undergoing R humeral ORIF on 09/26/20. Wife reports that patient wore a sling for about 5 weeks, now out of it. Patient typically uses SPC on R side and has been doing this since he got out of the sling. Both deny being given specific post-op precautions. Patient denies pain, however does have some discomfort and some swelling in the anterior arm. Unable to report what makes it worse. Notes that he is not able to move his arm as well as before his surgery- wife reports that she has to help him put his hair up into a ponytail. Reports that he has trouble with his feet and his balance. Notes that he has DM and his L 2nd toe was infected- this has since cleared up. Arm fx was caused by a fall down the stairs. Patient reports tingling in B feet d/t DM. Wife reports balance has been an issue for the past couple years and notes that she notices that the patient is more fatigued when going to the store than before most recent surgery.    Patient is accompained by: Family member   wife   Pertinent History stroke, HTN, GERD, DM, R reverse TSA 2017    Limitations Lifting;House hold activities;Walking;Standing  Diagnostic tests per wife- recent xrays on 11/06/20 showed good healing    Patient Stated Goals work on mobility and balance    Currently in Pain? No/denies    Pain Score 0-No pain    Pain Location Arm    Pain Orientation Right;Anterior    Pain Descriptors / Indicators Discomfort    Pain Type Acute pain;Surgical pain              [facility] PT Assessment - 11/18/20 1038      Assessment   Medical Diagnosis Encounter for other orthopedic aftercare; R humeral periprosthetic fx of R reverse TSA    Referring Provider (PT) Francena Hanly, MD    Onset Date/Surgical Date 09/26/20    Hand Dominance Right    Next MD Visit 12/18/20    Prior Therapy yes-  for shoulder and s/p stroke      Precautions   Precautions Fall      Balance Screen   Has the patient fallen in the past 6 months Yes    How many times? 2    Has the patient had a decrease in activity level because of a fear of falling?  No   wife reports yes   Is the patient reluctant to leave their home because of a fear of falling?  No      Home Tourist information centre manager residence    Living Arrangements Spouse/significant other    Available Help at Discharge Family    Type of Home House    Home Access Stairs to enter    Entrance Stairs-Number of Steps 3    Entrance Stairs-Rails None    Home Layout One level    Home Equipment [place] - single point;Walker - 2 wheels;Shower seat      Prior Function   Level of Independence Independent    Vocation Retired      Copy Status History of cognitive impairments - at baseline      Sensation   Light Touch Appears Intact      Coordination   Gross Motor Movements are Fluid and Coordinated Yes      Posture/Postural Control   Posture/Postural Control Postural limitations    Postural Limitations Rounded Shoulders;Forward head      ROM / Strength   AROM / PROM / Strength AROM;PROM;Strength      AROM   AROM Assessment Site Shoulder    Right/Left Shoulder Right;Left    Right Shoulder Flexion 94 Degrees    Right Shoulder ABduction 64 Degrees    Right Shoulder Internal Rotation --   FIR to coccyx   Right Shoulder External Rotation --   FER to R UT   Left Shoulder Flexion 146 Degrees    Left Shoulder ABduction 153 Degrees    Left Shoulder Internal Rotation --   FIR T9   Left Shoulder External Rotation --   FER T1     Strength   Strength Assessment Site Shoulder    Right/Left Shoulder Right;Left    Left Shoulder Flexion 4+/5    Left Shoulder ABduction 4+/5    Left Shoulder Internal Rotation 4-/5    Left Shoulder External Rotation 4+/5      Palpation   Palpation comment no TTP along R arm  and shoulder; long R anterior arm and shoulder incision well-healed      Ambulation/Gait   Gait Pattern Step-to pattern;Decreased step length - right;Decreased step length - left;Trunk flexed;Lateral trunk lean to right;Lateral  trunk lean to left    Ambulation Surface Level;Indoor    Gait velocity decreased   using SPC in R hand with hand backwards                     Objective measurements completed on examination: See above findings.               PT Education - 11/18/20 1206    Education Details prognosis, POC, HEP; advised patient to work on ambulating with SPC in L hand if able to safely to avoid R UE strain/excessive WBing    Person(s) Educated Patient;Spouse    Methods Explanation;Demonstration;Tactile cues;Verbal cues;Handout    Comprehension Verbalized understanding;Returned demonstration            PT Short Term Goals - 11/18/20 1213      PT SHORT TERM GOAL #1   Title Patient to be mod I with initial HEP.    Time 3    Period Weeks    Status New    Target Date 12/09/20             PT Long Term Goals - 11/18/20 1214      PT LONG TERM GOAL #1   Title Patient to be mod I with advanced HEP.    Time 8    Period Weeks    Status New    Target Date 01/13/21      PT LONG TERM GOAL #2   Title Patient to demonstrate R shoulder AROM WFL and pain-free.    Time 8    Period Weeks    Status New    Target Date 12/30/20      PT LONG TERM GOAL #3   Title Patient to demonstrate R shoulder strength >/=4+/5.    Time 8    Period Weeks    Status New    Target Date 12/30/20      PT LONG TERM GOAL #4   Title Patient to report ability to put his hair into a ponytail without assistance.    Time 8    Period Weeks    Status New    Target Date 01/13/21      PT LONG TERM GOAL #5   Title Patient to score >47/56 on Berg in order to decrease risk of falls.    Time 8    Period Weeks    Status New    Target Date 01/13/21                   Plan - 11/18/20 1207    Clinical Impression Statement Patient is a 75 y/o M presenting to OPPT with wife for assistance with subjective history with c/o R shoulder stiffness and imbalance s/p ORIF of R periprosthetic humeral shaft fracture on 09/26/20 caused by a fall. Patient is a poor historian s/p stroke in 2012, thus wife provided most subjective report. Both report patient having difficulty with R shoulder mobility as wife has to assist patient in putting his hair up into a ponytail. Patient also reports tingling in B feet d/t diabetes. Notes 2 falls in the past 2 months despite using [facility] for ambulation. Patient today presenting with rounded shoulders and forward head posture, limited R shoulder AROM, decreased R shoulder strength, and gait deviations. Patient and wife were educated on gentle AAROM HEP- both reported understanding. Plan for further balance assessment next session. Would benefit from skilled PT services 2x/week for 8 weeks to address aforementioned impairments.  Personal Factors and Comorbidities Age;Behavior Pattern;Comorbidity 3+;Fitness;Past/Current Experience;Time since onset of injury/illness/exacerbation;Transportation    Comorbidities stroke 2012, HTN, GERD, DM, R reverse TSA 2017    Examination-Activity Limitations Bathing;Bed Mobility;Bend;Squat;Stairs;Carry;Stand;Dressing;Transfers;Hygiene/Grooming;Lift;Locomotion Level;Reach Overhead    Examination-Participation Restrictions Church;Cleaning;Shop;Community Activity;Laundry;Meal Prep    Stability/Clinical Decision Making Stable/Uncomplicated    Clinical Decision Making Low    Rehab Potential Good    PT Frequency 2x / week    PT Duration 8 weeks    PT Treatment/Interventions ADLs/Self Care Home Management;Cryotherapy;Electrical Stimulation;Iontophoresis 4mg /ml Dexamethasone;Moist Heat;Balance training;Therapeutic exercise;Therapeutic activities;Functional mobility training;Stair training;Gait training;DME  Instruction;Ultrasound;Neuromuscular re-education;Patient/family education;Manual techniques;Vasopneumatic Device;Taping;Energy conservation;Dry needling;Passive range of motion;Scar mobilization    PT Next Visit Plan reassess HEP; assess Berg; progress R shoulder PROM/AAROM/AROM to tolerance    Consulted and Agree with Plan of Care Patient;Family member/caregiver    Family Member Consulted wife           Patient will benefit from skilled therapeutic intervention in order to improve the following deficits and impairments:  Abnormal gait,Decreased endurance,Hypomobility,Decreased scar mobility,Increased edema,Decreased knowledge of precautions,Decreased activity tolerance,Decreased strength,Increased fascial restricitons,Impaired UE functional use,Pain,Decreased balance,Decreased mobility,Difficulty walking,Increased muscle spasms,Improper body mechanics,Decreased range of motion,Impaired flexibility,Postural dysfunction  Visit Diagnosis: Stiffness of right shoulder, not elsewhere classified  History of falling  Unsteadiness on feet  Muscle weakness (generalized)  Acute pain of right shoulder     Problem List Patient Active Problem List   Diagnosis Date Noted  . S/P ORIF (open reduction internal fixation) fracture 09/26/2020  . 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

## 2020-11-21 ENCOUNTER — Other Ambulatory Visit: Payer: Self-pay

## 2020-11-21 ENCOUNTER — Ambulatory Visit: Payer: Medicare Other | Admitting: Physical Therapy

## 2020-11-21 ENCOUNTER — Encounter: Payer: Self-pay | Admitting: Physical Therapy

## 2020-11-21 DIAGNOSIS — M25511 Pain in right shoulder: Secondary | ICD-10-CM

## 2020-11-21 DIAGNOSIS — M6281 Muscle weakness (generalized): Secondary | ICD-10-CM

## 2020-11-21 DIAGNOSIS — Z9181 History of falling: Secondary | ICD-10-CM

## 2020-11-21 DIAGNOSIS — M25611 Stiffness of right shoulder, not elsewhere classified: Secondary | ICD-10-CM

## 2020-11-21 DIAGNOSIS — R2681 Unsteadiness on feet: Secondary | ICD-10-CM

## 2020-11-21 NOTE — Therapy (Signed)
[facility] [address]  [address] [place], [place], [address] Phone: [phone]   Fax:  [phone]  Physical Therapy Treatment  Patient Details  Name: [name] Soto MRN: [id number] Date of Birth: [birth year] Referring Provider (PT): Francena Hanly, MD   Encounter Date: 11/21/2020   PT End of Session - 11/21/20 1019    Visit Number 2    Number of Visits 17    Date for PT Re-Evaluation 01/13/21    Authorization Type Medicare & BCBS    PT Start Time 0932    PT Stop Time 1012    PT Time Calculation (min) 40 min    Equipment Utilized During Treatment Gait belt    Activity Tolerance Patient tolerated treatment well    Behavior During Therapy [facility] for tasks assessed/performed           Past Medical History:  Diagnosis Date  . Cerebral thrombosis with cerebral infarction (HCC)   . Diabetes mellitus without complication (HCC)   . GERD (gastroesophageal reflux disease)   . Humerus fracture    PROXIMAL RIGHT  . Hypertension   . Pancreatic lesion   . Stroke [facility])     Past Surgical History:  Procedure Laterality Date  . CATARACT EXTRACTION     RIGHT  . EUS N/A 07/17/2015   Procedure: ESOPHAGEAL ENDOSCOPIC ULTRASOUND (EUS) RADIAL;  Surgeon: Willis Modena, MD;  Location: [facility];  Service: Endoscopy;  Laterality: N/A;  . FINE NEEDLE ASPIRATION N/A 07/17/2015   Procedure: FINE NEEDLE ASPIRATION (FNA) RADIAL;  Surgeon: Willis Modena, MD;  Location: [facility];  Service: Endoscopy;  Laterality: N/A;  . HEMATOMA DRAINED    . HERNIA REPAIR     inguinal right  . HYDROCELE EXCISION    . ORIF HUMERUS FRACTURE Right 09/26/2020   Procedure: Open reduction internal fixation right humeral periprosthetic fracture;  Surgeon: Francena Hanly, MD;  Location: [facility];  Service: Orthopedics;  Laterality: Right;   . REVERSE SHOULDER ARTHROPLASTY Right 05/07/2016   Procedure: RIGHT REVERSE TOTAL SHOULDER ARTHROPLASTY;  Surgeon:  Francena Hanly, MD;  Location: [facility] OR;  Service: Orthopedics;  Laterality: Right;    There were no vitals filed for this visit.   Subjective Assessment - 11/21/20 0933    Subjective Arm feels okay. Did his HEP last night and denies questions.    Pertinent History stroke, HTN, GERD, DM, R reverse TSA 2017    Diagnostic tests per wife- recent xrays on 11/06/20 showed good healing    Patient Stated Goals work on mobility and balance    Currently in Pain? No/denies    Pain Score 1     Pain Location Shoulder    Pain Orientation Right;Anterior    Pain Descriptors / Indicators Discomfort   "popping"             [facility] PT Assessment - 11/21/20 0001      Standardized Balance Assessment   Standardized Balance Assessment Berg Balance Test      Berg Balance Test   Sit to Stand Able to stand without using hands and stabilize independently    Standing Unsupported Able to stand safely 2 minutes    Sitting with Back Unsupported but Feet Supported on Floor or Stool Able to sit safely and securely 2 minutes    Stand to Sit Sits safely with minimal use of hands    Transfers Able to transfer safely, minor use of hands    Standing Unsupported with Eyes  Closed Able to stand 10 seconds safely    Standing Unsupported with Feet Together Able to place feet together independently and stand for 1 minute with supervision    From Standing, Reach Forward with Outstretched Arm Can reach forward >12 cm safely (5")    From Standing Position, Pick up Object from Floor Able to pick up shoe, needs supervision    From Standing Position, Turn to Look Behind Over each Shoulder Needs supervision when turning    Turn 360 Degrees Needs close supervision or verbal cueing    Standing Unsupported, Alternately Place Feet on Step/Stool Needs assistance to keep from falling or unable to try    Standing Unsupported, One Foot in Front Loses balance while stepping or standing    Standing on One Leg Unable to try or needs assist to  prevent fall    Total Score 35                         [facility] Adult PT Treatment/Exercise - 11/21/20 0001      Ambulation/Gait   Ambulation Distance (Feet) 90 Feet    Assistive device Straight cane    Gait Pattern Step-to pattern;Decreased step length - right;Decreased step length - left;Trunk flexed;Lateral trunk lean to right;Lateral trunk lean to left    Ambulation Surface Level;Indoor    Gait Comments gait training with SPC in L hand, providing frequent manual and verbal cues for [facility] sequencing      Exercises   Exercises Shoulder;Knee/Hip      Shoulder Exercises: Supine   External Rotation Strengthening;Right;10 reps    External Rotation Limitations with wand to tolerance    Flexion Strengthening;Right;10 reps    Flexion Limitations with wand to tolerance   cues for form   ABduction AAROM;Right;10 reps    ABduction Limitations with wand to tolerance   manual and verbal cueing for form/alignment     Shoulder Exercises: Pulleys   Flexion 3 minutes    Flexion Limitations to tolerance    Scaption 3 minutes    Scaption Limitations to tolerance                  PT Education - 11/21/20 1018    Education Details discussion on Berg score and clinical relevance; update to HEP- advised to practice gait training at home    Person(s) Educated Patient    Methods Explanation;Demonstration;Tactile cues;Verbal cues;Handout    Comprehension Verbalized understanding;Returned demonstration            PT Short Term Goals - 11/21/20 1020      PT SHORT TERM GOAL #1   Title Patient to be mod I with initial HEP.    Time 3    Period Weeks    Status On-going    Target Date 12/09/20             PT Long Term Goals - 11/21/20 1020      PT LONG TERM GOAL #1   Title Patient to be mod I with advanced HEP.    Time 8    Period Weeks    Status On-going      PT LONG TERM GOAL #2   Title Patient to demonstrate R shoulder AROM WFL and pain-free.    Time 8     Period Weeks    Status On-going      PT LONG TERM GOAL #3   Title Patient to demonstrate R shoulder strength >/=4+/5.  Time 8    Period Weeks    Status On-going      PT LONG TERM GOAL #4   Title Patient to report ability to put his hair into a ponytail without assistance.    Time 8    Period Weeks    Status On-going      PT LONG TERM GOAL #5   Title Patient to score >47/56 on Berg in order to decrease risk of falls.    Time 8    Period Weeks    Status On-going   11/21/20: 35/56                Plan - 11/21/20 1019    Clinical Impression Statement Patient arrived to session with no new complaints. Arrived ambulating with SPC in L hand, however with inconsistent and unsteady gait pattern. Worked on gait training with SPC in L hand, providing frequent manual and verbal cues for [facility] sequencing. Patient demonstrated improved stability and sequencing after practice. Patient scored 35/56 on Berg, indicating increased risk of falls. Most difficulty was demonstrated with SLS activities and turning. Worked on reviewing shoulder AAROM HEP with cueing required for form and alignment. Patient reported no pain at end of session. Updated HEP with gait pattern practiced today for continued progress at home- patient reported understanding. Plan to continue progressing shoulder ROM and balance training next session.    Comorbidities stroke 2012, HTN, GERD, DM, R reverse TSA 2017    PT Treatment/Interventions ADLs/Self Care Home Management;Cryotherapy;Electrical Stimulation;Iontophoresis 4mg /ml Dexamethasone;Moist Heat;Balance training;Therapeutic exercise;Therapeutic activities;Functional mobility training;Stair training;Gait training;DME Instruction;Ultrasound;Neuromuscular re-education;Patient/family education;Manual techniques;Vasopneumatic Device;Taping;Energy conservation;Dry needling;Passive range of motion;Scar mobilization    PT Next Visit Plan progress R shoulder PROM/AAROM/AROM to  tolerance; balance training working towards SLS activities and turning; gait training with SPC in L hand    Consulted and Agree with Plan of Care Patient           Patient will benefit from skilled therapeutic intervention in order to improve the following deficits and impairments:  Abnormal gait,Decreased endurance,Hypomobility,Decreased scar mobility,Increased edema,Decreased knowledge of precautions,Decreased activity tolerance,Decreased strength,Increased fascial restricitons,Impaired UE functional use,Pain,Decreased balance,Decreased mobility,Difficulty walking,Increased muscle spasms,Improper body mechanics,Decreased range of motion,Impaired flexibility,Postural dysfunction  Visit Diagnosis: Stiffness of right shoulder, not elsewhere classified  History of falling  Unsteadiness on feet  Muscle weakness (generalized)  Acute pain of right shoulder     Problem List Patient Active Problem List   Diagnosis Date Noted  . S/P ORIF (open reduction internal fixation) fracture 09/26/2020  . 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

## 2020-11-25 ENCOUNTER — Other Ambulatory Visit: Payer: Self-pay

## 2020-11-25 ENCOUNTER — Encounter: Payer: Self-pay | Admitting: Physical Therapy

## 2020-11-25 ENCOUNTER — Ambulatory Visit: Payer: Medicare Other | Admitting: Physical Therapy

## 2020-11-25 DIAGNOSIS — R2681 Unsteadiness on feet: Secondary | ICD-10-CM

## 2020-11-25 DIAGNOSIS — M6281 Muscle weakness (generalized): Secondary | ICD-10-CM

## 2020-11-25 DIAGNOSIS — Z9181 History of falling: Secondary | ICD-10-CM

## 2020-11-25 DIAGNOSIS — M25611 Stiffness of right shoulder, not elsewhere classified: Secondary | ICD-10-CM | POA: Diagnosis not present

## 2020-11-25 DIAGNOSIS — M25511 Pain in right shoulder: Secondary | ICD-10-CM

## 2020-11-25 NOTE — Therapy (Signed)
[facility] [address]  [address] [place], [place], [address] Phone: [phone]   Fax:  [phone]  Physical Therapy Treatment  Patient Details  Name: [name] Soto MRN: [id number] Date of Birth: 11/18/1945 Referring Provider (PT): Francena Hanly, MD   Encounter Date: 11/25/2020   PT End of Session - 11/25/20 0846    Visit Number 3    Number of Visits 17    Date for PT Re-Evaluation 01/13/21    Authorization Type Medicare & BCBS    PT Start Time 0802    PT Stop Time 0842    PT Time Calculation (min) 40 min    Activity Tolerance Patient tolerated treatment well    Behavior During Therapy [facility] for tasks assessed/performed           Past Medical History:  Diagnosis Date  . Cerebral thrombosis with cerebral infarction (HCC)   . Diabetes mellitus without complication (HCC)   . GERD (gastroesophageal reflux disease)   . Humerus fracture    PROXIMAL RIGHT  . Hypertension   . Pancreatic lesion   . Stroke [facility])     Past Surgical History:  Procedure Laterality Date  . CATARACT EXTRACTION     RIGHT  . EUS N/A 07/17/2015   Procedure: ESOPHAGEAL ENDOSCOPIC ULTRASOUND (EUS) RADIAL;  Surgeon: Willis Modena, MD;  [facility];  Service: Endoscopy;  Laterality: N/A;  . FINE NEEDLE ASPIRATION N/A 07/17/2015   Procedure: FINE NEEDLE ASPIRATION (FNA) RADIAL;  Surgeon: Willis Modena, MD;  [facility]: [facility];  Service: Endoscopy;  Laterality: N/A;  . HEMATOMA DRAINED    . HERNIA REPAIR     inguinal right  . HYDROCELE EXCISION    . ORIF HUMERUS FRACTURE Right 09/26/2020   Procedure: Open reduction internal fixation right humeral periprosthetic fracture;  Surgeon: Francena Hanly, MD;  [facility];  Service: Orthopedics;  Laterality: Right;   . REVERSE SHOULDER ARTHROPLASTY Right 05/07/2016   Procedure: RIGHT REVERSE TOTAL SHOULDER ARTHROPLASTY;  Surgeon: Francena Hanly, MD;  Location: [facility];  Service:  Orthopedics;  Laterality: Right;    There were no vitals filed for this visit.   Subjective Assessment - 11/25/20 0803    Subjective Feeling fine. Exercises are going well- he is performing them. Still kind of tricky to walk with the [facility] in the L hand.    Pertinent History stroke, HTN, GERD, DM, R reverse TSA 2017    Diagnostic tests per wife- recent xrays on 11/06/20 showed good healing    Patient Stated Goals work on mobility and balance    Currently in Pain? No/denies              [facility] PT Assessment - 11/25/20 0001      AROM   AROM Assessment Site Ankle    Right/Left Ankle Right;Left    Right Ankle Dorsiflexion 10    Left Ankle Dorsiflexion 0      Strength   Strength Assessment Site Hip;Knee;Ankle    Right/Left Hip Right;Left    Right Hip Flexion 4/5    Right Hip ABduction 4+/5    Right Hip ADduction 4+/5    Left Hip Flexion 4+/5    Left Hip ABduction 4+/5    Left Hip ADduction 4+/5    Right/Left Knee Right;Left    Right Knee Flexion 4-/5    Right Knee Extension 4+/5    Left Knee Flexion 4-/5    Left Knee Extension 4+/5  Right/Left Ankle Right;Left    Right Ankle Dorsiflexion 4/5    Right Ankle Plantar Flexion 3+/5    Right Ankle Inversion 3+/5    Right Ankle Eversion 3+/5    Left Ankle Dorsiflexion 4/5    Left Ankle Plantar Flexion 3+/5    Left Ankle Inversion 3+/5    Left Ankle Eversion 3+/5                         [facility] Adult PT Treatment/Exercise - 11/25/20 0001      Ambulation/Gait   Ambulation Distance (Feet) 90 Feet    Assistive device Straight cane    Gait Pattern Step-to pattern;Decreased step length - right;Decreased step length - left;Trunk flexed;Lateral trunk lean to right;Lateral trunk lean to left;Poor foot clearance - left   increased L toe out   Ambulation Surface Level;Indoor    Gait Comments much improved SPC sequencing pattern with good ability to self-correct when losing proper pattern; CGA required for stability       Knee/Hip Exercises: Stretches   Gastroc Stretch Left;2 reps;30 seconds    Gastroc Stretch Limitations at [employer] rail    Other Knee/Hip Stretches B heel raise at TM rail 10x, B toe raise x10      Shoulder Exercises: Supine   Protraction Strengthening;Both;10 reps    Protraction Limitations serratus punch with wand   manual cues to matinain elbow straight     Shoulder Exercises: Seated   External Rotation AAROM;Right;10 reps    External Rotation Limitations with wand to tolerance; towel roll under arm   mirror and verbal feedback to maintain neutral shoulder   Flexion AAROM;Right;10 reps    Flexion Limitations with wand; to tolerance   R shoulder hike; mirror and verbal feedback to depress R shoulder   Abduction Strengthening;Right;10 reps    ABduction Limitations with wand; to tolerance   mirror and verbal feedback to depress R shoulder     Shoulder Exercises: Pulleys   Flexion 3 minutes    Flexion Limitations to tolerance    Scaption 3 minutes    Scaption Limitations to tolerance                  PT Education - 11/25/20 0845    Education Details update to HEP    Person(s) Educated Patient    Methods Explanation;Demonstration;Tactile cues;Verbal cues;Handout    Comprehension Returned demonstration;Verbalized understanding            PT Short Term Goals - 11/25/20 0846      PT SHORT TERM GOAL #1   Title Patient to be mod I with initial HEP.    Time 3    Period Weeks    Status Achieved    Target Date 12/09/20             PT Long Term Goals - 11/21/20 1020      PT LONG TERM GOAL #1   Title Patient to be mod I with advanced HEP.    Time 8    Period Weeks    Status On-going      PT LONG TERM GOAL #2   Title Patient to demonstrate R shoulder AROM WFL and pain-free.    Time 8    Period Weeks    Status On-going      PT LONG TERM GOAL #3   Title Patient to demonstrate R shoulder strength >/=4+/5.    Time 8    Period Weeks  Status On-going      PT  LONG TERM GOAL #4   Title Patient to report ability to put his hair into a ponytail without assistance.    Time 8    Period Weeks    Status On-going      PT LONG TERM GOAL #5   Title Patient to score >47/56 on Berg in order to decrease risk of falls.    Time 8    Period Weeks    Status On-going   11/21/20: 35/56                Plan - 11/25/20 0846    Clinical Impression Statement Patient without new complaints today. Noting some remaining difficulty ambulating with SPC in L hand, thus reviewed gait training. Patient demonstrated much improved SPC sequencing pattern with good ability to self-correct when losing proper pattern, however CGA required for stability. Patient demonstrates considerable L toe-out d/t limited ankle mobility during ambulation, thus introduced gentle calf strengthening. Strength testing revealed weakness in B HS and all planes of ankle motion. Proceeded with sitting R shoulder AAROM which patient tolerated well, however demonstrating considerable R shoulder hike- possibly present at PLOF. Initiated serratus punches with decreased ROM on R vs. L UE. Patient reported understanding of HEP and without complaints at end of session.    Comorbidities stroke 2012, HTN, GERD, DM, R reverse TSA 2017    PT Treatment/Interventions ADLs/Self Care Home Management;Cryotherapy;Electrical Stimulation;Iontophoresis 4mg /ml Dexamethasone;Moist Heat;Balance training;Therapeutic exercise;Therapeutic activities;Functional mobility training;Stair training;Gait training;DME Instruction;Ultrasound;Neuromuscular re-education;Patient/family education;Manual techniques;Vasopneumatic Device;Taping;Energy conservation;Dry needling;Passive range of motion;Scar mobilization    PT Next Visit Plan progress R shoulder PROM/AAROM/AROM to tolerance; balance training working towards SLS activities and turning; gait training with SPC in L hand    Consulted and Agree with Plan of Care Patient            Patient will benefit from skilled therapeutic intervention in order to improve the following deficits and impairments:  Abnormal gait,Decreased endurance,Hypomobility,Decreased scar mobility,Increased edema,Decreased knowledge of precautions,Decreased activity tolerance,Decreased strength,Increased fascial restricitons,Impaired UE functional use,Pain,Decreased balance,Decreased mobility,Difficulty walking,Increased muscle spasms,Improper body mechanics,Decreased range of motion,Impaired flexibility,Postural dysfunction  Visit Diagnosis: Stiffness of right shoulder, not elsewhere classified  History of falling  Unsteadiness on feet  Muscle weakness (generalized)  Acute pain of right shoulder     Problem List Patient Active Problem List   Diagnosis Date Noted  . S/P ORIF (open reduction internal fixation) fracture 09/26/2020  . 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

## 2020-11-27 ENCOUNTER — Ambulatory Visit: Payer: Medicare Other | Admitting: Physical Therapy

## 2020-11-27 ENCOUNTER — Other Ambulatory Visit: Payer: Self-pay

## 2020-11-27 ENCOUNTER — Encounter: Payer: Self-pay | Admitting: Physical Therapy

## 2020-11-27 DIAGNOSIS — M25611 Stiffness of right shoulder, not elsewhere classified: Secondary | ICD-10-CM | POA: Diagnosis not present

## 2020-11-27 DIAGNOSIS — R2681 Unsteadiness on feet: Secondary | ICD-10-CM

## 2020-11-27 DIAGNOSIS — M6281 Muscle weakness (generalized): Secondary | ICD-10-CM

## 2020-11-27 DIAGNOSIS — M25511 Pain in right shoulder: Secondary | ICD-10-CM

## 2020-11-27 DIAGNOSIS — Z9181 History of falling: Secondary | ICD-10-CM

## 2020-11-27 NOTE — Therapy (Signed)
[facility] Outpatient Rehabilitation [facility] [address]  [address] [place], [place], [address] Phone: [phone]   Fax:  [phone]  Physical Therapy Treatment  Patient Details  Name: [name] Soto MRN: [id number] Date of Birth: 01/05/1946 Referring Provider (PT): Francena Hanly, MD   Encounter Date: 11/27/2020   PT End of Session - 11/27/20 0845    Visit Number 4    Number of Visits 17    Date for PT Re-Evaluation 01/13/21    Authorization Type Medicare & BCBS    PT Start Time 0801    PT Stop Time 0841    PT Time Calculation (min) 40 min    Equipment Utilized During Treatment Gait belt    Activity Tolerance Patient tolerated treatment well    Behavior During Therapy [facility] for tasks assessed/performed           Past Medical History:  Diagnosis Date  . Cerebral thrombosis with cerebral infarction (HCC)   . Diabetes mellitus without complication (HCC)   . GERD (gastroesophageal reflux disease)   . Humerus fracture    PROXIMAL RIGHT  . Hypertension   . Pancreatic lesion   . Stroke [facility])     Past Surgical History:  Procedure Laterality Date  . CATARACT EXTRACTION     RIGHT  . EUS N/A 07/17/2015   Procedure: ESOPHAGEAL ENDOSCOPIC ULTRASOUND (EUS) RADIAL;  Surgeon: Willis Modena, MD;  Location: [facility];  Service: Endoscopy;  Laterality: N/A;  . FINE NEEDLE ASPIRATION N/A 07/17/2015   Procedure: FINE NEEDLE ASPIRATION (FNA) RADIAL;  Surgeon: Willis Modena, MD;  Location: [facility];  Service: Endoscopy;  Laterality: N/A;  . HEMATOMA DRAINED    . HERNIA REPAIR     inguinal right  . HYDROCELE EXCISION    . ORIF HUMERUS FRACTURE Right 09/26/2020   Procedure: Open reduction internal fixation right humeral periprosthetic fracture;  Surgeon: Francena Hanly, MD;  Location: [facility]S;  Service: Orthopedics;  Laterality: Right;   . REVERSE SHOULDER ARTHROPLASTY Right 05/07/2016   Procedure: RIGHT REVERSE TOTAL SHOULDER ARTHROPLASTY;  Surgeon:  Francena Hanly, MD;  Location: [facility] OR;  Service: Orthopedics;  Laterality: Right;    There were no vitals filed for this visit.   Subjective Assessment - 11/27/20 0802    Subjective Reports that his shoulder has been sore. "I imagine it's just tired."    Patient is accompained by: Family member   wife   Pertinent History stroke, HTN, GERD, DM, R reverse TSA 2017    Diagnostic tests per wife- recent xrays on 11/06/20 showed good healing    Patient Stated Goals work on mobility and balance    Currently in Pain? Yes    Pain Score 1     Pain Location Shoulder    Pain Orientation Right    Pain Descriptors / Indicators Sore    Pain Type Acute pain;Surgical pain                             [facility] Adult PT Treatment/Exercise - 11/27/20 0001      Ambulation/Gait   Ambulation Distance (Feet) 180 Feet    Assistive device Straight cane    Gait Pattern Step-to pattern;Decreased step length - right;Decreased step length - left;Trunk flexed;Lateral trunk lean to right;Lateral trunk lean to left;Poor foot clearance - left    Ambulation Surface Level;Indoor    Gait Comments gait training with SPC in R & L hand- unsteadiness  present with both versions, but slightly improved with cane in R hand      Shoulder Exercises: Standing   External Rotation Strengthening;Right;10 reps;Theraband    Theraband Level (Shoulder External Rotation) Level 1 (Yellow)    External Rotation Limitations isometric stepouts with towel roll under arm   difficulty maintaining neutral shoulder & demonstrating imbalance despite SPC use   Internal Rotation Strengthening;Right;10 reps;Theraband    Theraband Level (Shoulder Internal Rotation) Level 1 (Yellow)    Internal Rotation Limitations isometric stepouts with towel roll under arm   unsteadiness, even with SPC   Extension Strengthening;Both;10 reps;Theraband    Theraband Level (Shoulder Extension) Level 1 (Yellow)    Extension Limitations 2x10; good form     Row Strengthening;Both;10 reps;Theraband    Theraband Level (Shoulder Row) Level 2 (Red)    Row Limitations 2x10; manual and verbal cues for scap squeeze and avoiding extension past neutral      Shoulder Exercises: Pulleys   Flexion 3 minutes    Flexion Limitations to tolerance    Scaption 3 minutes    Scaption Limitations to tolerance               Balance Exercises - 11/27/20 0001      Balance Exercises: Standing   Standing Eyes Opened Narrow base of support (BOS);2 reps;30 secs   at TM rail   Standing Eyes Closed 30 secs;Narrow base of support (BOS);2 reps   intermittent UE support on TM rail required; CGA            PT Education - 11/27/20 0844    Education Details advised patient to return to using [facility] in R hand for max safety    Methods Explanation;Demonstration;Tactile cues;Verbal cues    Comprehension Verbalized understanding;Returned demonstration            PT Short Term Goals - 11/25/20 0846      PT SHORT TERM GOAL #1   Title Patient to be mod I with initial HEP.    Time 3    Period Weeks    Status Achieved    Target Date 12/09/20             PT Long Term Goals - 11/21/20 1020      PT LONG TERM GOAL #1   Title Patient to be mod I with advanced HEP.    Time 8    Period Weeks    Status On-going      PT LONG TERM GOAL #2   Title Patient to demonstrate R shoulder AROM WFL and pain-free.    Time 8    Period Weeks    Status On-going      PT LONG TERM GOAL #3   Title Patient to demonstrate R shoulder strength >/=4+/5.    Time 8    Period Weeks    Status On-going      PT LONG TERM GOAL #4   Title Patient to report ability to put his hair into a ponytail without assistance.    Time 8    Period Weeks    Status On-going      PT LONG TERM GOAL #5   Title Patient to score >47/56 on Berg in order to decrease risk of falls.    Time 8    Period Weeks    Status On-going   11/21/20: 35/56                Plan - 11/27/20 0845     Clinical  Impression Statement Patient arrived to session with report of increased but mild soreness in the R shoulder. Patient still demonstrating inconsistent SPC sequencing with ambulation with cane in the L hand, thus advised patient to switch back to using it in the R hand for max safety. Patient reported understanding. Initiated periscapular strengthening with consistent cueing required for proper form and alignment. Isometric RTC strengthening was also initiated with patient limited both by shoulder weakness and imbalance. Patient performed static balance exercises with increased sway demonstrated with EC vs. EO today. Patient without complaints at end of session.    Comorbidities stroke 2012, HTN, GERD, DM, R reverse TSA 2017    PT Treatment/Interventions ADLs/Self Care Home Management;Cryotherapy;Electrical Stimulation;Iontophoresis 4mg /ml Dexamethasone;Moist Heat;Balance training;Therapeutic exercise;Therapeutic activities;Functional mobility training;Stair training;Gait training;DME Instruction;Ultrasound;Neuromuscular re-education;Patient/family education;Manual techniques;Vasopneumatic Device;Taping;Energy conservation;Dry needling;Passive range of motion;Scar mobilization    PT Next Visit Plan progress R shoulder PROM/AAROM/AROM to tolerance; balance training working towards SLS activities and turning    Consulted and Agree with Plan of Care Patient           Patient will benefit from skilled therapeutic intervention in order to improve the following deficits and impairments:  Abnormal gait,Decreased endurance,Hypomobility,Decreased scar mobility,Increased edema,Decreased knowledge of precautions,Decreased activity tolerance,Decreased strength,Increased fascial restricitons,Impaired UE functional use,Pain,Decreased balance,Decreased mobility,Difficulty walking,Increased muscle spasms,Improper body mechanics,Decreased range of motion,Impaired flexibility,Postural dysfunction  Visit  Diagnosis: Stiffness of right shoulder, not elsewhere classified  History of falling  Unsteadiness on feet  Muscle weakness (generalized)  Acute pain of right shoulder     Problem List Patient Active Problem List   Diagnosis Date Noted  . S/P ORIF (open reduction internal fixation) fracture 09/26/2020  . 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

## 2020-12-04 ENCOUNTER — Ambulatory Visit: Payer: Medicare Other | Admitting: Physical Therapy

## 2020-12-04 ENCOUNTER — Other Ambulatory Visit: Payer: Self-pay

## 2020-12-04 ENCOUNTER — Encounter: Payer: Self-pay | Admitting: Physical Therapy

## 2020-12-04 DIAGNOSIS — M25611 Stiffness of right shoulder, not elsewhere classified: Secondary | ICD-10-CM

## 2020-12-04 DIAGNOSIS — Z9181 History of falling: Secondary | ICD-10-CM

## 2020-12-04 DIAGNOSIS — R2681 Unsteadiness on feet: Secondary | ICD-10-CM

## 2020-12-04 DIAGNOSIS — M6281 Muscle weakness (generalized): Secondary | ICD-10-CM

## 2020-12-04 DIAGNOSIS — M25511 Pain in right shoulder: Secondary | ICD-10-CM

## 2020-12-04 NOTE — Therapy (Signed)
[facility] Outpatient Rehabilitation [facility] [address]  [address] [place], [place], [address] Phone: [phone]   Fax:  [phone]  Physical Therapy Treatment  Patient Details  Name: [name] Soto MRN: [id number] Date of Birth: [birth year] Referring Provider (PT): Francena Hanly, MD   Encounter Date: 12/04/2020   PT End of Session - 12/04/20 1013    Visit Number 5    Number of Visits 17    Date for PT Re-Evaluation 01/13/21    Authorization Type Medicare & BCBS    PT Start Time 0930    PT Stop Time 1012    PT Time Calculation (min) 42 min    Equipment Utilized During Treatment Gait belt    Activity Tolerance Patient tolerated treatment well    Behavior During Therapy [facility] for tasks assessed/performed           Past Medical History:  Diagnosis Date  . Cerebral thrombosis with cerebral infarction (HCC)   . Diabetes mellitus without complication (HCC)   . GERD (gastroesophageal reflux disease)   . Humerus fracture    PROXIMAL RIGHT  . Hypertension   . Pancreatic lesion   . Stroke [facility])     Past Surgical History:  Procedure Laterality Date  . CATARACT EXTRACTION     RIGHT  . EUS N/A 07/17/2015   Procedure: ESOPHAGEAL ENDOSCOPIC ULTRASOUND (EUS) RADIAL;  Surgeon: Willis Modena, MD;  [facility];  Service: Endoscopy;  Laterality: N/A;  . FINE NEEDLE ASPIRATION N/A 07/17/2015   Procedure: FINE NEEDLE ASPIRATION (FNA) RADIAL;  Surgeon: Willis Modena, MD;  [facility];  Service: Endoscopy;  Laterality: N/A;  . HEMATOMA DRAINED    . HERNIA REPAIR     inguinal right  . HYDROCELE EXCISION    . ORIF HUMERUS FRACTURE Right 09/26/2020   Procedure: Open reduction internal fixation right humeral periprosthetic fracture;  Surgeon: Francena Hanly, MD;  [facility];  Service: Orthopedics;  Laterality: Right;   . REVERSE SHOULDER ARTHROPLASTY Right 05/07/2016   Procedure: RIGHT REVERSE TOTAL SHOULDER ARTHROPLASTY;  Surgeon:  Francena Hanly, MD;  [facility] OR;  Service: Orthopedics;  Laterality: Right;    There were no vitals filed for this visit.   Subjective Assessment - 12/04/20 0932    Subjective Still has some discomfort in the R shoulder.    Patient is accompained by: Family member    Pertinent History stroke, HTN, GERD, DM, R reverse TSA 2017    Diagnostic tests per wife- recent xrays on 11/06/20 showed good healing    Patient Stated Goals work on mobility and balance    Currently in Pain? Yes    Pain Score 1     Pain Location Shoulder    Pain Orientation Right    Pain Descriptors / Indicators Discomfort;Constant    Pain Type Acute pain;Surgical pain                             [facility] Adult PT Treatment/Exercise - 12/04/20 0001      Shoulder Exercises: Seated   External Rotation Right;10 reps;Strengthening;Theraband    External Rotation Limitations 2x10; yellow TB; shoulder in neutral    Internal Rotation Strengthening;Right;10 reps    Internal Rotation Limitations yellow TB;2x10;  shoulder in neutral    Flexion AAROM;Right;10 reps;AROM    Flexion Limitations 10x with wand, 10x without; to tolerance   good ROM, R hike still evident   Abduction Right;10  reps;AAROM;AROM    ABduction Limitations 10x with wand, 10x without; to tolerance   good ROM, R hike still evident     Shoulder Exercises: Pulleys   Flexion 3 minutes    Flexion Limitations to tolerance    Scaption 3 minutes    Scaption Limitations to tolerance               Balance Exercises - 12/04/20 0001      Balance Exercises: Standing   Standing Eyes Opened Narrow base of support (BOS);Foam/compliant surface;3 reps   good stability firm surface; mod sway on foam   Standing Eyes Closed 30 secs;Narrow base of support (BOS);2 reps;Limitations   supervision for safety   Other Standing Exercises Comments R/L wt shift + foot tap on 6" step 10x without UE support   good response to "shift and step"               PT Short Term Goals - 11/25/20 0846      PT SHORT TERM GOAL #1   Title Patient to be mod I with initial HEP.    Time 3    Period Weeks    Status Achieved    Target Date 12/09/20             PT Long Term Goals - 11/21/20 1020      PT LONG TERM GOAL #1   Title Patient to be mod I with advanced HEP.    Time 8    Period Weeks    Status On-going      PT LONG TERM GOAL #2   Title Patient to demonstrate R shoulder AROM WFL and pain-free.    Time 8    Period Weeks    Status On-going      PT LONG TERM GOAL #3   Title Patient to demonstrate R shoulder strength >/=4+/5.    Time 8    Period Weeks    Status On-going      PT LONG TERM GOAL #4   Title Patient to report ability to put his hair into a ponytail without assistance.    Time 8    Period Weeks    Status On-going      PT LONG TERM GOAL #5   Title Patient to score >47/56 on Berg in order to decrease risk of falls.    Time 8    Period Weeks    Status On-going   11/21/20: 35/56                Plan - 12/04/20 1013    Clinical Impression Statement Patient reporting continued mild discomfort in the R anterior shoulder. Ambulating with SPC in R hand, with improved sequencing and stability. Worked on narrow BOS and compliant surface balance training with patient demonstrating most instability with EC and when standing on foam- attributes this to issues with his toes. Attempted step ups onto foam, however patient very hesitant and demonstrated considerable imbalance. Shifted activity to firm step, with good response to cueing to "shift and step." Patient demonstrated good ROM with shoulder AAROM, however still with shoulder hike evident on R. Initiated shoulder AROM, again with shoulder hike evident, requiring cueing to reduce. Initiated sitting shoulder rotation strengthening instead of standing isometric stepouts with better success d/t decreased risk of falls. Patient reported no increase in pain at end of  session.    Comorbidities stroke 2012, HTN, GERD, DM, R reverse TSA 2017    PT Treatment/Interventions ADLs/Self Care Home  Management;Cryotherapy;Electrical Stimulation;Iontophoresis 4mg /ml Dexamethasone;Moist Heat;Balance training;Therapeutic exercise;Therapeutic activities;Functional mobility training;Stair training;Gait training;DME Instruction;Ultrasound;Neuromuscular re-education;Patient/family education;Manual techniques;Vasopneumatic Device;Taping;Energy conservation;Dry needling;Passive range of motion;Scar mobilization    PT Next Visit Plan progress R shoulder PROM/AAROM/AROM to tolerance; balance training working towards SLS activities and turning    Consulted and Agree with Plan of Care Patient    Family Member Consulted wife           Patient will benefit from skilled therapeutic intervention in order to improve the following deficits and impairments:  Abnormal gait,Decreased endurance,Hypomobility,Decreased scar mobility,Increased edema,Decreased knowledge of precautions,Decreased activity tolerance,Decreased strength,Increased fascial restricitons,Impaired UE functional use,Pain,Decreased balance,Decreased mobility,Difficulty walking,Increased muscle spasms,Improper body mechanics,Decreased range of motion,Impaired flexibility,Postural dysfunction  Visit Diagnosis: Stiffness of right shoulder, not elsewhere classified  History of falling  Unsteadiness on feet  Muscle weakness (generalized)  Acute pain of right shoulder     Problem List Patient Active Problem List   Diagnosis Date Noted  . S/P ORIF (open reduction internal fixation) fracture 09/26/2020  . 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  [address] [place], [place], [place] Phone: [phone]   Fax:  [phone]  Name: [name] Soto MRN: Wonda Olds Date of  Birth: April 24, 1946

## 2020-12-06 ENCOUNTER — Other Ambulatory Visit: Payer: Self-pay

## 2020-12-06 ENCOUNTER — Ambulatory Visit: Payer: Medicare Other

## 2020-12-06 DIAGNOSIS — M25611 Stiffness of right shoulder, not elsewhere classified: Secondary | ICD-10-CM

## 2020-12-06 DIAGNOSIS — M6281 Muscle weakness (generalized): Secondary | ICD-10-CM

## 2020-12-06 DIAGNOSIS — M25511 Pain in right shoulder: Secondary | ICD-10-CM

## 2020-12-06 DIAGNOSIS — R2681 Unsteadiness on feet: Secondary | ICD-10-CM

## 2020-12-06 DIAGNOSIS — Z9181 History of falling: Secondary | ICD-10-CM

## 2020-12-06 NOTE — Therapy (Signed)
[facility] Outpatient Rehabilitation [facility] [address]  [address] [place], [place], [address] Phone: [phone]   Fax:  [phone]  Physical Therapy Treatment  Patient Details  Name: [name] Soto MRN: [id number] Date of Birth: 1946/06/10 Referring Provider (PT): Francena Hanly, MD   Encounter Date: 12/06/2020   PT End of Session - 12/06/20 1155    Visit Number 6    Number of Visits 17    Date for PT Re-Evaluation 01/13/21    Authorization Type Medicare & BCBS    PT Start Time 0933    PT Stop Time 1011    PT Time Calculation (min) 38 min    Equipment Utilized During Treatment Gait belt    Activity Tolerance Patient tolerated treatment well    Behavior During Therapy [facility] for tasks assessed/performed           Past Medical History:  Diagnosis Date  . Cerebral thrombosis with cerebral infarction (HCC)   . Diabetes mellitus without complication (HCC)   . GERD (gastroesophageal reflux disease)   . Humerus fracture    PROXIMAL RIGHT  . Hypertension   . Pancreatic lesion   . Stroke [facility])     Past Surgical History:  Procedure Laterality Date  . CATARACT EXTRACTION     RIGHT  . EUS N/A 07/17/2015   Procedure: ESOPHAGEAL ENDOSCOPIC ULTRASOUND (EUS) RADIAL;  Surgeon: Willis Modena, MD;  [facility];  Service: Endoscopy;  Laterality: N/A;  . FINE NEEDLE ASPIRATION N/A 07/17/2015   Procedure: FINE NEEDLE ASPIRATION (FNA) RADIAL;  Surgeon: Willis Modena, MD;  [facility];  Service: Endoscopy;  Laterality: N/A;  . HEMATOMA DRAINED    . HERNIA REPAIR     inguinal right  . HYDROCELE EXCISION    . ORIF HUMERUS FRACTURE Right 09/26/2020   Procedure: Open reduction internal fixation right humeral periprosthetic fracture;  Surgeon: Francena Hanly, MD;  [facility];  Service: Orthopedics;  Laterality: Right;   . REVERSE SHOULDER ARTHROPLASTY Right 05/07/2016   Procedure: RIGHT REVERSE TOTAL SHOULDER ARTHROPLASTY;  Surgeon:  Francena Hanly, MD;  [facility];  Service: Orthopedics;  Laterality: Right;    There were no vitals filed for this visit.   Subjective Assessment - 12/06/20 0935    Subjective Pt reports shoulder is about the same, a little stiff.    Patient is accompained by: Family member    Pertinent History stroke, HTN, GERD, DM, R reverse TSA 2017    Diagnostic tests per wife- recent xrays on 11/06/20 showed good healing    Patient Stated Goals work on mobility and balance    Currently in Pain? --   discomfort             [facility] PT Assessment - 12/06/20 0001      AROM   Right Shoulder Flexion 96 Degrees    Right Shoulder ABduction 80 Degrees      Strength   Right Shoulder Flexion 4/5    Right Shoulder ABduction 3+/5                         [facility] Adult PT Treatment/Exercise - 12/06/20 0001      Shoulder Exercises: Seated   External Rotation Right;20 reps;Theraband    External Rotation Limitations Y tband 10 reps, R tband 10 reps; towel to keep elbow in    Flexion AAROM;Both;10 reps;Limitations    Flexion Limitations cane    Abduction Right;10 reps;AAROM;Limitations  ABduction Limitations cane      Shoulder Exercises: Pulleys   Flexion 3 minutes    Flexion Limitations to tolerance    Scaption 3 minutes    Scaption Limitations to tolerance               Balance Exercises - 12/06/20 0001      Balance Exercises: Standing   Standing Eyes Opened Foam/compliant surface;3 reps;10 secs    Other Standing Exercises toe taps onto airex 12 reps B: very unsteady, troublw with initiating movements    Other Standing Exercises Comments '               PT Short Term Goals - 11/25/20 0846      PT SHORT TERM GOAL #1   Title Patient to be mod I with initial HEP.    Time 3    Period Weeks    Status Achieved    Target Date 12/09/20             PT Long Term Goals - 12/06/20 0949      PT LONG TERM GOAL #1   Title Patient to be mod I with advanced HEP.     Time 8    Period Weeks    Status On-going      PT LONG TERM GOAL #2   Title Patient to demonstrate R shoulder AROM WFL and pain-free.    Time 8    Period Weeks    Status On-going      PT LONG TERM GOAL #3   Title Patient to demonstrate R shoulder strength >/=4+/5.    Time 8    Period Weeks    Status On-going      PT LONG TERM GOAL #4   Title Patient to report ability to put his hair into a ponytail without assistance.    Time 8    Period Weeks    Status On-going      PT LONG TERM GOAL #5   Title Patient to score >47/56 on Berg in order to decrease risk of falls.    Time 8    Period Weeks    Status On-going   11/21/20: 35/56                Plan - 12/06/20 1156    Clinical Impression Statement Pt had no reports of pain during session. Shoulder ROM has increased with abd and flex, pt notes that he does not have pain with these motions but it is uncomfortable into the end ranges. Strength in his R shld was measured 3+/5 abduction and 4/5 flex. He has trouble keeping his shoulder depressed with OH movements needed lots of cues to prevent shoulder hike. Tried some balance with him on airex, some unsteadiness shown, had trouble initiating the step up onto the airex. Min assist given during balance activities for stability, he did have some occasional LOB but was able to recover with some assistance. Good overall response.    Personal Factors and Comorbidities Age;Behavior Pattern;Comorbidity 3+;Fitness;Past/Current Experience;Time since onset of injury/illness/exacerbation;Transportation    Comorbidities stroke 2012, HTN, GERD, DM, R reverse TSA 2017    PT Frequency 2x / week    PT Duration 8 weeks    PT Treatment/Interventions ADLs/Self Care Home Management;Cryotherapy;Electrical Stimulation;Iontophoresis 4mg /ml Dexamethasone;Moist Heat;Balance training;Therapeutic exercise;Therapeutic activities;Functional mobility training;Stair training;Gait training;DME  Instruction;Ultrasound;Neuromuscular re-education;Patient/family education;Manual techniques;Vasopneumatic Device;Taping;Energy conservation;Dry needling;Passive range of motion;Scar mobilization    PT Next Visit Plan progress R shoulder PROM/AAROM/AROM to tolerance; balance training  working towards [employer] activities and turning    Financial planner with Plan of Care Patient    Family Member Consulted wife           Patient will benefit from skilled therapeutic intervention in order to improve the following deficits and impairments:  Abnormal gait,Decreased endurance,Hypomobility,Decreased scar mobility,Increased edema,Decreased knowledge of precautions,Decreased activity tolerance,Decreased strength,Increased fascial restricitons,Impaired UE functional use,Pain,Decreased balance,Decreased mobility,Difficulty walking,Increased muscle spasms,Improper body mechanics,Decreased range of motion,Impaired flexibility,Postural dysfunction  Visit Diagnosis: Stiffness of right shoulder, not elsewhere classified  History of falling  Unsteadiness on feet  Muscle weakness (generalized)  Acute pain of right shoulder     Problem List Patient Active Problem List   Diagnosis Date Noted  . S/P ORIF (open reduction internal fixation) fracture 09/26/2020  . 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

## 2020-12-09 ENCOUNTER — Ambulatory Visit: Payer: Medicare Other | Admitting: Physical Therapy

## 2020-12-12 ENCOUNTER — Other Ambulatory Visit: Payer: Self-pay

## 2020-12-12 ENCOUNTER — Ambulatory Visit: Payer: Medicare Other

## 2020-12-12 DIAGNOSIS — Z9181 History of falling: Secondary | ICD-10-CM

## 2020-12-12 DIAGNOSIS — M6281 Muscle weakness (generalized): Secondary | ICD-10-CM

## 2020-12-12 DIAGNOSIS — R2681 Unsteadiness on feet: Secondary | ICD-10-CM

## 2020-12-12 DIAGNOSIS — M25611 Stiffness of right shoulder, not elsewhere classified: Secondary | ICD-10-CM

## 2020-12-12 DIAGNOSIS — M25511 Pain in right shoulder: Secondary | ICD-10-CM

## 2020-12-12 NOTE — Therapy (Signed)
[facility] [address]  [address] [place], [place], [address] Phone: [phone]   Fax:  [phone]  Physical Therapy Treatment  Patient Details  Name: [name] Soto MRN: [id number] Date of Birth: [birth year] Referring Provider (PT): Francena Hanly, MD   Encounter Date: 12/12/2020   PT End of Session - 12/12/20 1013    Visit Number 7    Number of Visits 17    Date for PT Re-Evaluation 01/13/21    Authorization Type Medicare & BCBS    PT Start Time 0933    PT Stop Time 1011    PT Time Calculation (min) 38 min    Equipment Utilized During Treatment Gait belt    Activity Tolerance Patient tolerated treatment well    Behavior During Therapy [facility] for tasks assessed/performed           Past Medical History:  Diagnosis Date  . Cerebral thrombosis with cerebral infarction (HCC)   . Diabetes mellitus without complication (HCC)   . GERD (gastroesophageal reflux disease)   . Humerus fracture    PROXIMAL RIGHT  . Hypertension   . Pancreatic lesion   . Stroke [facility])     Past Surgical History:  Procedure Laterality Date  . CATARACT EXTRACTION     RIGHT  . EUS N/A 07/17/2015   Procedure: ESOPHAGEAL ENDOSCOPIC ULTRASOUND (EUS) RADIAL;  Surgeon: Willis Modena, MD;  Location: [facility];  Service: Endoscopy;  Laterality: N/A;  . FINE NEEDLE ASPIRATION N/A 07/17/2015   Procedure: FINE NEEDLE ASPIRATION (FNA) RADIAL;  Surgeon: Willis Modena, MD;  Location: [facility];  Service: Endoscopy;  Laterality: N/A;  . HEMATOMA DRAINED    . HERNIA REPAIR     inguinal right  . HYDROCELE EXCISION    . ORIF HUMERUS FRACTURE Right 09/26/2020   Procedure: Open reduction internal fixation right humeral periprosthetic fracture;  Surgeon: Francena Hanly, MD;  Location: [facility];  Service: Orthopedics;  Laterality: Right;   . REVERSE SHOULDER ARTHROPLASTY Right 05/07/2016   Procedure: RIGHT REVERSE TOTAL SHOULDER ARTHROPLASTY;  Surgeon:  Francena Hanly, MD;  Location: [facility] OR;  Service: Orthopedics;  Laterality: Right;    There were no vitals filed for this visit.   Subjective Assessment - 12/12/20 0934    Subjective Shoulder is still sore and stiff.    Pertinent History stroke, HTN, GERD, DM, R reverse TSA 2017    Diagnostic tests per wife- recent xrays on 11/06/20 showed good healing    Patient Stated Goals work on mobility and balance    Currently in Pain? No/denies                             [facility] Adult PT Treatment/Exercise - 12/12/20 0001      Shoulder Exercises: Seated   Flexion AAROM;Both;Limitations;20 reps    Flexion Limitations cane    Abduction Right;10 reps;AAROM;Limitations    ABduction Limitations cane      Shoulder Exercises: Standing   Flexion AAROM;Both;10 reps;Limitations    Flexion Limitations L hand over R hand assist      Shoulder Exercises: Pulleys   Flexion 3 minutes    Flexion Limitations to tolerance    Scaption 3 minutes    Scaption Limitations to tolerance      Shoulder Exercises: Therapy Ball   Flexion Both;10 reps;Limitations    Flexion Limitations blue beach ball  Balance Exercises - 12/12/20 0001      Balance Exercises: Standing   Tandem Gait Forward;Upper extremity support;2 reps   along the mat table   Other Standing Exercises toe taps onto airex and step downs from airex 10 reps each               PT Short Term Goals - 11/25/20 0846      PT SHORT TERM GOAL #1   Title Patient to be mod I with initial HEP.    Time 3    Period Weeks    Status Achieved    Target Date 12/09/20             PT Long Term Goals - 12/06/20 0949      PT LONG TERM GOAL #1   Title Patient to be mod I with advanced HEP.    Time 8    Period Weeks    Status On-going      PT LONG TERM GOAL #2   Title Patient to demonstrate R shoulder AROM WFL and pain-free.    Time 8    Period Weeks    Status On-going      PT LONG TERM GOAL #3   Title  Patient to demonstrate R shoulder strength >/=4+/5.    Time 8    Period Weeks    Status On-going      PT LONG TERM GOAL #4   Title Patient to report ability to put his hair into a ponytail without assistance.    Time 8    Period Weeks    Status On-going      PT LONG TERM GOAL #5   Title Patient to score >47/56 on Berg in order to decrease risk of falls.    Time 8    Period Weeks    Status On-going   11/21/20: 35/56                Plan - 12/12/20 1013    Clinical Impression Statement Pt responded well, still alot of stiffness in the R shoulder with OH movements. Focused on shoulder AROM/AAROM with cueing given to avoid scapular compensation. Pt had occasional LOB during balance activities, cues needed to reset body positiong and LEs before trying to initiate another movement. Will try some PROM to help increase shoulder motion and cont balance activities to increase stability and decrease risk for falls.    Personal Factors and Comorbidities Age;Behavior Pattern;Comorbidity 3+;Fitness;Past/Current Experience;Time since onset of injury/illness/exacerbation;Transportation    Comorbidities stroke 2012, HTN, GERD, DM, R reverse TSA 2017    PT Frequency 2x / week    PT Duration 8 weeks    PT Treatment/Interventions ADLs/Self Care Home Management;Cryotherapy;Electrical Stimulation;Iontophoresis 4mg /ml Dexamethasone;Moist Heat;Balance training;Therapeutic exercise;Therapeutic activities;Functional mobility training;Stair training;Gait training;DME Instruction;Ultrasound;Neuromuscular re-education;Patient/family education;Manual techniques;Vasopneumatic Device;Taping;Energy conservation;Dry needling;Passive range of motion;Scar mobilization    PT Next Visit Plan progress R shoulder PROM/AAROM/AROM to tolerance; balance training working towards SLS activities and turning    Consulted and Agree with Plan of Care Patient           Patient will benefit from skilled therapeutic intervention  in order to improve the following deficits and impairments:  Abnormal gait,Decreased endurance,Hypomobility,Decreased scar mobility,Increased edema,Decreased knowledge of precautions,Decreased activity tolerance,Decreased strength,Increased fascial restricitons,Impaired UE functional use,Pain,Decreased balance,Decreased mobility,Difficulty walking,Increased muscle spasms,Improper body mechanics,Decreased range of motion,Impaired flexibility,Postural dysfunction  Visit Diagnosis: Stiffness of right shoulder, not elsewhere classified  History of falling  Unsteadiness on feet  Muscle weakness (generalized)  Acute  pain of right shoulder     Problem List Patient Active Problem List   Diagnosis Date Noted  . S/P ORIF (open reduction internal fixation) fracture 09/26/2020  . 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

## 2020-12-16 ENCOUNTER — Encounter: Payer: Self-pay | Admitting: Physical Therapy

## 2020-12-16 ENCOUNTER — Other Ambulatory Visit: Payer: Self-pay

## 2020-12-16 ENCOUNTER — Ambulatory Visit: Payer: Medicare Other | Attending: Orthopedic Surgery | Admitting: Physical Therapy

## 2020-12-16 DIAGNOSIS — Z9181 History of falling: Secondary | ICD-10-CM

## 2020-12-16 DIAGNOSIS — M6281 Muscle weakness (generalized): Secondary | ICD-10-CM | POA: Diagnosis present

## 2020-12-16 DIAGNOSIS — R2681 Unsteadiness on feet: Secondary | ICD-10-CM

## 2020-12-16 DIAGNOSIS — M25611 Stiffness of right shoulder, not elsewhere classified: Secondary | ICD-10-CM

## 2020-12-16 DIAGNOSIS — M25511 Pain in right shoulder: Secondary | ICD-10-CM | POA: Diagnosis present

## 2020-12-16 NOTE — Therapy (Signed)
[facility] [address]  [place], [place], [address] Phone: [phone]   Fax:  [phone]  Physical Therapy Progress Note  Patient Details  Name: [name] Soto MRN: [id number] Date of Birth: 1946-03-04 Referring Provider (PT): Justice Britain, MD  Progress Note Reporting Period 11/18/20 to 12/16/20  See note below for Objective Data and Assessment of Progress/Goals.     Encounter Date: 12/16/2020   PT End of Session - 12/16/20 1104    Visit Number 8    Number of Visits 17    Date for PT Re-Evaluation 01/13/21    Authorization Type Medicare & BCBS    PT Start Time 0935    PT Stop Time 1016    PT Time Calculation (min) 41 min    Equipment Utilized During Treatment Gait belt    Activity Tolerance Patient tolerated treatment well    Behavior During Therapy WFL for tasks assessed/performed           Past Medical History:  Diagnosis Date  . Cerebral thrombosis with cerebral infarction ([place])   . Diabetes mellitus without complication ([place])   . GERD (gastroesophageal reflux disease)   . Humerus fracture    PROXIMAL RIGHT  . Hypertension   . Pancreatic lesion   . Stroke [facility])     Past Surgical History:  Procedure Laterality Date  . CATARACT EXTRACTION     RIGHT  . EUS N/A 07/17/2015   Procedure: ESOPHAGEAL ENDOSCOPIC ULTRASOUND (EUS) RADIAL;  Surgeon: Arta Silence, MD;  Location: [facility];  Service: Endoscopy;  Laterality: N/A;  . FINE NEEDLE ASPIRATION N/A 07/17/2015   Procedure: FINE NEEDLE ASPIRATION (FNA) RADIAL;  Surgeon: Arta Silence, MD;  [facility]: [facility];  Service: Endoscopy;  Laterality: N/A;  . HEMATOMA DRAINED    . HERNIA REPAIR     inguinal right  . HYDROCELE EXCISION    . ORIF HUMERUS FRACTURE Right 09/26/2020   Procedure: Open reduction internal fixation right humeral periprosthetic fracture;  Surgeon: Justice Britain, MD;  Location: [facility];  Service: Orthopedics;  Laterality:  Right;  146mn  . REVERSE SHOULDER ARTHROPLASTY Right 05/07/2016   Procedure: RIGHT REVERSE TOTAL SHOULDER ARTHROPLASTY;  Surgeon: KJustice Britain MD;  [place] [facility]  Service: Orthopedics;  Laterality: Right;    There were no vitals filed for this visit.   Subjective Assessment - 12/16/20 0938    Subjective Sinuses have been bothering him this AM. Shoulder feels fine, about the same.    Pertinent History stroke, HTN, GERD, DM, R reverse TSA 2017    Diagnostic tests per wife- recent xrays on 11/06/20 showed good healing    Patient Stated Goals work on mobility and balance    Currently in Pain? No/denies              [facility] - 12/16/20 0001      Assessment   Medical Diagnosis Encounter for other orthopedic aftercare; R humeral periprosthetic fx of R reverse TSA    Referring Provider (PT) KJustice Britain MD    Onset Date/Surgical Date 09/26/20      AROM   Right Shoulder Flexion 111 Degrees    Right Shoulder ABduction 74 Degrees    Right Shoulder Internal Rotation --   FIR to PSIS   Right Shoulder External Rotation --   FER lateral to T1     Strength   Right Shoulder Flexion 4+/5    Right Shoulder ABduction 4-/5    Right  Shoulder Internal Rotation 4+/5    Right Shoulder External Rotation 4+/5      Berg Balance Test   Sit to Stand Able to stand without using hands and stabilize independently    Standing Unsupported Able to stand safely 2 minutes    Sitting with Back Unsupported but Feet Supported on Floor or Stool Able to sit safely and securely 2 minutes    Stand to Sit Sits safely with minimal use of hands    Transfers Able to transfer safely, minor use of hands    Standing Unsupported with Eyes Closed Able to stand 10 seconds safely    Standing Unsupported with Feet Together Able to place feet together independently and stand 1 minute safely    From Standing, Reach Forward with Outstretched Arm Can reach forward >12 cm safely (5")    From Standing Position, Pick  up Object from Floor Able to pick up shoe safely and easily    From Standing Position, Turn to Look Behind Over each Shoulder Turn sideways only but maintains balance    Turn 360 Degrees Able to turn 360 degrees safely but slowly    Standing Unsupported, Alternately Place Feet on Step/Stool Needs assistance to keep from falling or unable to try    Standing Unsupported, One Foot in [employer] balance while stepping or standing    Standing on One Leg Unable to try or needs assist to prevent fall    Total Score 39                         [facility] Adult PT Treatment/Exercise - 12/16/20 0001      Shoulder Exercises: ROM/Strengthening   UBE (Upper Arm Bike) L1.0 x 3 min forward, 3 min back      Manual Therapy   Manual Therapy Passive ROM;Joint mobilization;Soft tissue mobilization;Myofascial release    Manual therapy comments supine    Joint Mobilization R shoulder AP, PA, distraction mobs grade III to tolerance    Soft tissue mobilization STM to R proximal biceps and biceps muscle belly, anterior delt    Myofascial Release manual TPR to R proximal biceps and biceps muscle belly, anterior delt    Passive ROM R shoulder PROM in all directions to tolerance with prolonged holds   excessive muscle guarding                   PT Short Term Goals - 12/16/20 0954      PT SHORT TERM GOAL #1   Title Patient to be mod I with initial HEP.    Time 3    Period Weeks    Status Achieved    Target Date 12/09/20             PT Long Term Goals - 12/16/20 0954      PT LONG TERM GOAL #1   Title Patient to be mod I with advanced HEP.    Time 8    Period Weeks    Status On-going   reports "not consistent" d/t being "lazy I guess"     PT LONG TERM GOAL #2   Title Patient to demonstrate R shoulder AROM WFL and pain-free.    Time 8    Period Weeks    Status Partially Met   improved in all planes except abduction     PT LONG TERM GOAL #3   Title Patient to demonstrate R  shoulder strength >/=4+/5.    Time  8    Period Weeks    Status Partially Met   improved in all planes     PT LONG TERM GOAL #4   Title Patient to report ability to put his hair into a ponytail without assistance.    Time 8    Period Weeks    Status Achieved      PT LONG TERM GOAL #5   Title Patient to score >47/56 on Berg in order to decrease risk of falls.    Time 8    Period Weeks    Status On-going   12/16/20: 39/56                Plan - 12/16/20 1103    Clinical Impression Statement Patient arrived to session without new concerns. Reports limited compliance with HEP d/t "laziness." Patient does however report ability to put his hair in a ponytail without assistance for the past 4-5 days, now reaching this goal. R shoulder AROM has improved in flexion, IR, and ER. Small decline seen in abduction AROM today. R shoulder strength has improved in all planes. Berg balance score improved to 39/56, still indicating increased risk of falls but improved from last measurement. Patient tolerated R shoulder PROM, joint mobs, and STM to address considerable soft tissue restriction in R biceps and anterior deltoid. Guarding very apparent with PROM today, particularly in IR. Educated patient on benefits of HEP compliance for max benefit from PT- he reported understanding. Patient overall is demonstrating good progress towards goals. Would benefit from continued skilled PT services to address remaining goals.    Personal Factors and Comorbidities Age;Behavior Pattern;Comorbidity 3+;Fitness;Past/Current Experience;Time since onset of injury/illness/exacerbation;Transportation    Comorbidities stroke 2012, HTN, GERD, DM, R reverse TSA 2017    PT Frequency 2x / week    PT Duration 8 weeks    PT Treatment/Interventions ADLs/Self Care Home Management;Cryotherapy;Electrical Stimulation;Iontophoresis 42m/ml Dexamethasone;Moist Heat;Balance training;Therapeutic exercise;Therapeutic activities;Functional  mobility training;Stair training;Gait training;DME Instruction;Ultrasound;Neuromuscular re-education;Patient/family education;Manual techniques;Vasopneumatic Device;Taping;Energy conservation;Dry needling;Passive range of motion;Scar mobilization    PT Next Visit Plan progress R shoulder PROM/AAROM/AROM to tolerance; balance training working towards SLS activities and turning    Consulted and Agree with Plan of Care Patient           Patient will benefit from skilled therapeutic intervention in order to improve the following deficits and impairments:  Abnormal gait,Decreased endurance,Hypomobility,Decreased scar mobility,Increased edema,Decreased knowledge of precautions,Decreased activity tolerance,Decreased strength,Increased fascial restricitons,Impaired UE functional use,Pain,Decreased balance,Decreased mobility,Difficulty walking,Increased muscle spasms,Improper body mechanics,Decreased range of motion,Impaired flexibility,Postural dysfunction  Visit Diagnosis: Stiffness of right shoulder, not elsewhere classified  History of falling  Unsteadiness on feet  Muscle weakness (generalized)  Acute pain of right shoulder     Problem List Patient Active Problem List   Diagnosis Date Noted  . S/P ORIF (open reduction internal fixation) fracture 09/26/2020  . 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

## 2020-12-19 ENCOUNTER — Other Ambulatory Visit: Payer: Self-pay

## 2020-12-19 ENCOUNTER — Ambulatory Visit: Payer: Medicare Other

## 2020-12-19 DIAGNOSIS — M25511 Pain in right shoulder: Secondary | ICD-10-CM

## 2020-12-19 DIAGNOSIS — Z9181 History of falling: Secondary | ICD-10-CM

## 2020-12-19 DIAGNOSIS — M25611 Stiffness of right shoulder, not elsewhere classified: Secondary | ICD-10-CM | POA: Diagnosis not present

## 2020-12-19 DIAGNOSIS — R2681 Unsteadiness on feet: Secondary | ICD-10-CM

## 2020-12-19 DIAGNOSIS — M6281 Muscle weakness (generalized): Secondary | ICD-10-CM

## 2020-12-19 NOTE — Therapy (Signed)
[facility] [address]  [place], [place], [address] Phone: [phone]   Fax:  [phone]  Physical Therapy Treatment  Patient Details  Name: [name] Soto MRN: [id number] Date of Birth: September 22, 1945 Referring Provider (PT): Justice Britain, MD   Encounter Date: 12/19/2020   PT End of Session - 12/19/20 0913    Visit Number 9    Number of Visits 17    Date for PT Re-Evaluation 01/13/21    Authorization Type Medicare & BCBS    PT Start Time 1740    PT Stop Time 0929    PT Time Calculation (min) 42 min    Equipment Utilized During Treatment Gait belt   SPC to ambulate   Activity Tolerance Patient tolerated treatment well    Behavior During Therapy [facility] for tasks assessed/performed           Past Medical History:  Diagnosis Date  . Cerebral thrombosis with cerebral infarction ([place])   . Diabetes mellitus without complication ([place])   . GERD (gastroesophageal reflux disease)   . Humerus fracture    PROXIMAL RIGHT  . Hypertension   . Pancreatic lesion   . Stroke [facility])     Past Surgical History:  Procedure Laterality Date  . CATARACT EXTRACTION     RIGHT  . EUS N/A 07/17/2015   Procedure: ESOPHAGEAL ENDOSCOPIC ULTRASOUND (EUS) RADIAL;  Surgeon: Arta Silence, MD;  [facility];  Service: Endoscopy;  Laterality: N/A;  . FINE NEEDLE ASPIRATION N/A 07/17/2015   Procedure: FINE NEEDLE ASPIRATION (FNA) RADIAL;  Surgeon: Arta Silence, MD;  [facility];  Service: Endoscopy;  Laterality: N/A;  . HEMATOMA DRAINED    . HERNIA REPAIR     inguinal right  . HYDROCELE EXCISION    . ORIF HUMERUS FRACTURE Right 09/26/2020   Procedure: Open reduction internal fixation right humeral periprosthetic fracture;  Surgeon: Justice Britain, MD;  [facility]: [facility];  Service: Orthopedics;  Laterality: Right;  113mn  . REVERSE SHOULDER ARTHROPLASTY Right 05/07/2016   Procedure: RIGHT REVERSE TOTAL SHOULDER  ARTHROPLASTY;  Surgeon: KJustice Britain MD;  [facility]  Service: Orthopedics;  Laterality: Right;    There were no vitals filed for this visit.   Subjective Assessment - 12/19/20 0849    Subjective Pt reports his allergies are really bothering him. He usually coughs all night long.    Pertinent History stroke, HTN, GERD, DM, R reverse TSA 2017    Diagnostic tests per wife- recent xrays on 11/06/20 showed good healing    Patient Stated Goals work on mobility and balance    Currently in Pain? Yes    Pain Score 2     Pain Location Shoulder    Pain Orientation Right;Anterior;Distal    Pain Descriptors / Indicators Sore              [facility] PT Assessment - 12/19/20 0001      AROM   Right Shoulder Flexion 140 Degrees   in supine   Right Shoulder ABduction 80 Degrees   in supine                        [facility] Adult PT Treatment/Exercise - 12/19/20 0001      Neuro Re-ed    Neuro Re-ed Details  fwd/lat step ups and partial lunge holds performed as neuro re-ed to address B LE weakness, postural control and awareness, and static/dynamic balance for decreased fall  risk      Knee/Hip Exercises: Standing   Forward Lunges Limitations partial hold for balance x30" ea with close Supervision    Lateral Step Up Both;1 set;10 reps;Hand Hold: 1;Step Height: 6"    Lateral Step Up Limitations CGA + HHA for R step ups d/t difficulty initiating power to step up    Forward Step Up Both;1 set;10 reps;Hand Hold: 1;Step Height: 6"    Forward Step Up Limitations Close supervision/CGA   incr difficulty with L LE     Shoulder Exercises: ROM/Strengthening   UBE (Upper Arm Bike) L1.0 x 3 min forward, 3 min back      Manual Therapy   Manual Therapy Passive ROM;Joint mobilization;Soft tissue mobilization;Myofascial release    Manual therapy comments supine    Joint Mobilization R shoulder AP, PA, distraction mobs grade III to tolerance    Soft tissue mobilization STM to R proximal biceps  and biceps muscle belly, anterior delt    Myofascial Release manual TPR to R proximal biceps and biceps muscle belly, anterior delt    Passive ROM R shoulder PROM in all directions to tolerance with prolonged holds   excessive muscle guarding              Balance Exercises - 12/19/20 0001      Balance Exercises: Standing   Step Ups Forward;Lateral;6 inch;4 inch;UE support 1    Sidestepping 3 reps    Other Standing Exercises partial lunge at sink 3x20" B   external perturbations during last set            PT Education - 12/19/20 1437    Education Details Posture, balance    Person(s) Educated Patient    Methods Explanation;Demonstration;Verbal cues;Tactile cues    Comprehension Verbalized understanding;Returned demonstration;Verbal cues required;Tactile cues required            PT Short Term Goals - 12/16/20 0954      PT SHORT TERM GOAL #1   Title Patient to be mod I with initial HEP.    Time 3    Period Weeks    Status Achieved    Target Date 12/09/20             PT Long Term Goals - 12/16/20 0954      PT LONG TERM GOAL #1   Title Patient to be mod I with advanced HEP.    Time 8    Period Weeks    Status On-going   reports "not consistent" d/t being "lazy I guess"     PT LONG TERM GOAL #2   Title Patient to demonstrate R shoulder AROM WFL and pain-free.    Time 8    Period Weeks    Status Partially Met   improved in all planes except abduction     PT LONG TERM GOAL #3   Title Patient to demonstrate R shoulder strength >/=4+/5.    Time 8    Period Weeks    Status Partially Met   improved in all planes     PT LONG TERM GOAL #4   Title Patient to report ability to put his hair into a ponytail without assistance.    Time 8    Period Weeks    Status Achieved      PT LONG TERM GOAL #5   Title Patient to score >47/56 on Berg in order to decrease risk of falls.    Time 8    Period Weeks    Status  On-going   12/16/20: 39/56                 Plan - 12/19/20 0933    Clinical Impression Statement Pt continues to report balance is his biggest problem. Significant muscle guarding present with manual therapy and PROM to R shoulder, but no pain reported. He ambulates with SPC, flexed knees, decreased heel strike and push off, decreased weight shift and trunk rotation. Ale to complete all exercises and balance challenges with CGA provided as needed to steady patient.    Personal Factors and Comorbidities Age;Behavior Pattern;Comorbidity 3+;Fitness;Past/Current Experience;Time since onset of injury/illness/exacerbation;Transportation    Comorbidities stroke 2012, HTN, GERD, DM, R reverse TSA 2017    PT Frequency 2x / week    PT Duration 8 weeks    PT Treatment/Interventions ADLs/Self Care Home Management;Cryotherapy;Electrical Stimulation;Iontophoresis 90m/ml Dexamethasone;Moist Heat;Balance training;Therapeutic exercise;Therapeutic activities;Functional mobility training;Stair training;Gait training;DME Instruction;Ultrasound;Neuromuscular re-education;Patient/family education;Manual techniques;Vasopneumatic Device;Taping;Energy conservation;Dry needling;Passive range of motion;Scar mobilization    PT Next Visit Plan progress R shoulder PROM/AAROM/AROM to tolerance; balance training working towards SLS activities and turning    Consulted and Agree with Plan of Care Patient           Patient will benefit from skilled therapeutic intervention in order to improve the following deficits and impairments:  Abnormal gait,Decreased endurance,Hypomobility,Decreased scar mobility,Increased edema,Decreased knowledge of precautions,Decreased activity tolerance,Decreased strength,Increased fascial restricitons,Impaired UE functional use,Pain,Decreased balance,Decreased mobility,Difficulty walking,Increased muscle spasms,Improper body mechanics,Decreased range of motion,Impaired flexibility,Postural dysfunction  Visit Diagnosis: Stiffness of right shoulder,  not elsewhere classified  History of falling  Unsteadiness on feet  Muscle weakness (generalized)  Acute pain of right shoulder     Problem List Patient Active Problem List   Diagnosis Date Noted  . S/P ORIF (open reduction internal fixation) fracture 09/26/2020  . 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    [facility]ell [facility] PT, DPT 12/19/2020, 2:38 PM  [facility] [place] [address]Phone: [phone]  Fax:  [phone] Name: [name] CARDOSAMRN: [id number] of Birth: 215-Feb-1947

## 2020-12-23 ENCOUNTER — Other Ambulatory Visit: Payer: Self-pay

## 2020-12-23 ENCOUNTER — Ambulatory Visit: Payer: Medicare Other

## 2020-12-23 DIAGNOSIS — M25611 Stiffness of right shoulder, not elsewhere classified: Secondary | ICD-10-CM

## 2020-12-23 DIAGNOSIS — Z9181 History of falling: Secondary | ICD-10-CM

## 2020-12-23 DIAGNOSIS — M6281 Muscle weakness (generalized): Secondary | ICD-10-CM

## 2020-12-23 DIAGNOSIS — R2681 Unsteadiness on feet: Secondary | ICD-10-CM

## 2020-12-23 DIAGNOSIS — M25511 Pain in right shoulder: Secondary | ICD-10-CM

## 2020-12-23 NOTE — Therapy (Signed)
[facility] [address]  [place], [place], [address] Phone: [phone]   Fax:  [phone]  Physical Therapy Treatment  Patient Details  Name: [name] Soto MRN: [id number] Date of Birth: [birth year] Referring Provider (PT): Justice Britain, MD   Encounter Date: 12/23/2020   PT End of Session - 12/23/20 0931    Visit Number 10    Number of Visits 17    Date for PT Re-Evaluation 01/13/21    Authorization Type Medicare & BCBS    PT Start Time [phone]    PT Stop Time 0930    PT Time Calculation (min) 38 min    Equipment Utilized During Treatment Gait belt    Activity Tolerance Patient tolerated treatment well    Behavior During Therapy [facility] for tasks assessed/performed           Past Medical History:  Diagnosis Date  . Cerebral thrombosis with cerebral infarction ([place])   . Diabetes mellitus without complication ([place])   . GERD (gastroesophageal reflux disease)   . Humerus fracture    PROXIMAL RIGHT  . Hypertension   . Pancreatic lesion   . Stroke [facility])     Past Surgical History:  Procedure Laterality Date  . CATARACT EXTRACTION     RIGHT  . EUS N/A 07/17/2015   Procedure: ESOPHAGEAL ENDOSCOPIC ULTRASOUND (EUS) RADIAL;  Surgeon: Arta Silence, MD;  [facility];  Service: Endoscopy;  Laterality: N/A;  . FINE NEEDLE ASPIRATION N/A 07/17/2015   Procedure: FINE NEEDLE ASPIRATION (FNA) RADIAL;  Surgeon: Arta Silence, MD;  [facility];  Service: Endoscopy;  Laterality: N/A;  . HEMATOMA DRAINED    . HERNIA REPAIR     inguinal right  . HYDROCELE EXCISION    . ORIF HUMERUS FRACTURE Right 09/26/2020   Procedure: Open reduction internal fixation right humeral periprosthetic fracture;  Surgeon: Justice Britain, MD;  [facility];  Service: Orthopedics;  Laterality: Right;  165mn  . REVERSE SHOULDER ARTHROPLASTY Right 05/07/2016   Procedure: RIGHT REVERSE TOTAL SHOULDER ARTHROPLASTY;  Surgeon:  KJustice Britain MD;  [facility]  Service: Orthopedics;  Laterality: Right;    There were no vitals filed for this visit.   Subjective Assessment - 12/23/20 0854    Subjective Last session went ok.    Patient is accompained by: Family member    Pertinent History stroke, HTN, GERD, DM, R reverse TSA 2017    Diagnostic tests per wife- recent xrays on 11/06/20 showed good healing    Patient Stated Goals work on mobility and balance    Currently in Pain? Yes    Pain Score 2     Pain Location Shoulder    Pain Orientation Right;Anterior    Pain Descriptors / Indicators Sore    Pain Type Acute pain;Surgical pain                             [facility] Adult PT Treatment/Exercise - 12/23/20 0001      Exercises   Exercises Shoulder;Knee/Hip      Knee/Hip Exercises: Standing   Lateral Step Up Both;1 set;10 reps;Hand Hold: 2;Step Height: 4"    Lateral Step Up Limitations min assist    Forward Step Up Both;1 set;Hand Hold: 1;Step Height: 4";5 reps    Forward Step Up Limitations min assist      Shoulder Exercises: Supine   Flexion AAROM;Both;10 reps    Flexion Limitations  with cane 10x10"      Shoulder Exercises: Pulleys   Flexion 3 minutes    Flexion Limitations to tolerance    Scaption 3 minutes    Scaption Limitations to tolerance      Manual Therapy   Manual Therapy Passive ROM;Soft tissue mobilization;Myofascial release    Manual therapy comments supine    Soft tissue mobilization STM to R biceps muscle belly, anterior delt    Passive ROM R shoulder PROM in all directions to tolerance with prolonged holds                    PT Short Term Goals - 12/16/20 0954      PT SHORT TERM GOAL #1   Title Patient to be mod I with initial HEP.    Time 3    Period Weeks    Status Achieved    Target Date 12/09/20             PT Long Term Goals - 12/16/20 0954      PT LONG TERM GOAL #1   Title Patient to be mod I with advanced HEP.    Time 8     Period Weeks    Status On-going   reports "not consistent" d/t being "lazy I guess"     PT LONG TERM GOAL #2   Title Patient to demonstrate R shoulder AROM WFL and pain-free.    Time 8    Period Weeks    Status Partially Met   improved in all planes except abduction     PT LONG TERM GOAL #3   Title Patient to demonstrate R shoulder strength >/=4+/5.    Time 8    Period Weeks    Status Partially Met   improved in all planes     PT LONG TERM GOAL #4   Title Patient to report ability to put his hair into a ponytail without assistance.    Time 8    Period Weeks    Status Achieved      PT LONG TERM GOAL #5   Title Patient to score >47/56 on Berg in order to decrease risk of falls.    Time 8    Period Weeks    Status On-going   12/16/20: 39/56                Plan - 12/23/20 1022    Clinical Impression Statement Worked on R shoulder ROM with patient along with stair stepping to increase LE strength and stability. Lots of muscle guarding during MT, with constant cues to relax R shoulder, tender points found in biceps muscle belly and ant deltoid, pt reported no pain during MT. Tried 4' steps with him, he responded well but requiring cues for equal weight shift and for controlled movement during step ups. No complaints at the end of session.    Personal Factors and Comorbidities Age;Behavior Pattern;Comorbidity 3+;Fitness;Past/Current Experience;Time since onset of injury/illness/exacerbation;Transportation    Comorbidities stroke 2012, HTN, GERD, DM, R reverse TSA 2017    Examination-Activity Limitations Bathing;Bed Mobility;Bend;Squat;Stairs;Carry;Stand;Dressing;Transfers;Hygiene/Grooming;Lift;Locomotion Level;Reach Overhead    PT Frequency 2x / week    PT Duration 8 weeks    PT Treatment/Interventions ADLs/Self Care Home Management;Cryotherapy;Electrical Stimulation;Iontophoresis 6m/ml Dexamethasone;Moist Heat;Balance training;Therapeutic exercise;Therapeutic  activities;Functional mobility training;Stair training;Gait training;DME Instruction;Ultrasound;Neuromuscular re-education;Patient/family education;Manual techniques;Vasopneumatic Device;Taping;Energy conservation;Dry needling;Passive range of motion;Scar mobilization    PT Next Visit Plan progress R shoulder PROM/AAROM/AROM to tolerance; balance training working towards SLS activities and turning  Consulted and Agree with Plan of Care Patient    Family Member Consulted wife           Patient will benefit from skilled therapeutic intervention in order to improve the following deficits and impairments:  Abnormal gait,Decreased endurance,Hypomobility,Decreased scar mobility,Increased edema,Decreased knowledge of precautions,Decreased activity tolerance,Decreased strength,Increased fascial restricitons,Impaired UE functional use,Pain,Decreased balance,Decreased mobility,Difficulty walking,Increased muscle spasms,Improper body mechanics,Decreased range of motion,Impaired flexibility,Postural dysfunction  Visit Diagnosis: Stiffness of right shoulder, not elsewhere classified  History of falling  Unsteadiness on feet  Muscle weakness (generalized)  Acute pain of right shoulder     Problem List Patient Active Problem List   Diagnosis Date Noted  . S/P ORIF (open reduction internal fixation) fracture 09/26/2020  . 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  [place], [place], [address] Phone: [phone]   Fax:  [phone]  Name: [name] Soto MRN: [id number] Date of Birth: 12-Mar-1946

## 2020-12-26 ENCOUNTER — Other Ambulatory Visit: Payer: Self-pay

## 2020-12-26 ENCOUNTER — Encounter: Payer: Self-pay | Admitting: Physical Therapy

## 2020-12-26 ENCOUNTER — Ambulatory Visit: Payer: Medicare Other | Admitting: Physical Therapy

## 2020-12-26 DIAGNOSIS — R2681 Unsteadiness on feet: Secondary | ICD-10-CM

## 2020-12-26 DIAGNOSIS — M6281 Muscle weakness (generalized): Secondary | ICD-10-CM

## 2020-12-26 DIAGNOSIS — M25611 Stiffness of right shoulder, not elsewhere classified: Secondary | ICD-10-CM

## 2020-12-26 DIAGNOSIS — M25511 Pain in right shoulder: Secondary | ICD-10-CM

## 2020-12-26 DIAGNOSIS — Z9181 History of falling: Secondary | ICD-10-CM

## 2020-12-26 NOTE — Therapy (Signed)
[facility] [address]  [place], [place], [address] Phone: [phone]   Fax:  [phone]  Physical Therapy Treatment  Patient Details  Name: [name] Soto MRN: [id number] Date of Birth: [birth year] Referring Provider (PT): Justice Britain, MD   Encounter Date: 12/26/2020   PT End of Session - 12/26/20 0927    Visit Number 11    Number of Visits 17    Date for PT Re-Evaluation 01/13/21    Authorization Type Medicare & BCBS    PT Start Time 0845    PT Stop Time 0926    PT Time Calculation (min) 41 min    Equipment Utilized During Treatment Gait belt    Activity Tolerance Patient tolerated treatment well    Behavior During Therapy [facility] for tasks assessed/performed           Past Medical History:  Diagnosis Date  . Cerebral thrombosis with cerebral infarction ([place])   . Diabetes mellitus without complication ([place])   . GERD (gastroesophageal reflux disease)   . Humerus fracture    PROXIMAL RIGHT  . Hypertension   . Pancreatic lesion   . Stroke [facility])     Past Surgical History:  Procedure Laterality Date  . CATARACT EXTRACTION     RIGHT  . EUS N/A 07/17/2015   Procedure: ESOPHAGEAL ENDOSCOPIC ULTRASOUND (EUS) RADIAL;  Surgeon: Arta Silence, MD;  [facility];  Service: Endoscopy;  Laterality: N/A;  . FINE NEEDLE ASPIRATION N/A 07/17/2015   Procedure: FINE NEEDLE ASPIRATION (FNA) RADIAL;  Surgeon: Arta Silence, MD;  [facility];  Service: Endoscopy;  Laterality: N/A;  . HEMATOMA DRAINED    . HERNIA REPAIR     inguinal right  . HYDROCELE EXCISION    . ORIF HUMERUS FRACTURE Right 09/26/2020   Procedure: Open reduction internal fixation right humeral periprosthetic fracture;  Surgeon: Justice Britain, MD;  [facility];  Service: Orthopedics;  Laterality: Right;  188mn  . REVERSE SHOULDER ARTHROPLASTY Right 05/07/2016   Procedure: RIGHT REVERSE TOTAL SHOULDER ARTHROPLASTY;  Surgeon:  KJustice Britain MD;  [place] [facility]  Service: Orthopedics;  Laterality: Right;    There were no vitals filed for this visit.   Subjective Assessment - 12/26/20 0846    Subjective The shoulder felt really good yesterday- feeling okay today. PA said that the shoulder was doing pretty good but would like him to continue with PT.    Pertinent History stroke, HTN, GERD, DM, R reverse TSA 2017    Diagnostic tests per wife- recent xrays on 11/06/20 showed good healing    Patient Stated Goals work on mobility and balance    Currently in Pain? No/denies                             [facility] Adult PT Treatment/Exercise - 12/26/20 0001      Neuro Re-ed    Neuro Re-ed Details  gait training around cones with SPC x4 min   excessive L foot toe out, causing 1 episoide of catching toes; gait training + R/L turns x8 min     Shoulder Exercises: Supine   External Rotation Strengthening;Right;5 reps;Theraband    External Rotation Weight (lbs) 1,2    External Rotation Limitations shoulder in 45 deg; focusing on max stretch to tolerance    Internal Rotation Strengthening;Right;5 reps    Internal Rotation Weight (lbs) 1,2    Internal Rotation Limitations shoulder  in 45 deg; focusing on max stretch to tolerance      Shoulder Exercises: Sidelying   External Rotation Strengthening;Right;10 reps    External Rotation Weight (lbs) 1    External Rotation Limitations limited ROM    ABduction Strengthening;Right;10 reps    ABduction Weight (lbs) 1,2    ABduction Limitations 10x 1#, 5x 2#; limited ROM; thumb up      Shoulder Exercises: Pulleys   Flexion 3 minutes    Flexion Limitations to tolerance    Scaption 3 minutes    Scaption Limitations to tolerance               Balance Exercises - 12/26/20 0001      Balance Exercises: Standing   Other Standing Exercises Comments R/L weight shift + step on 4" step 5x each, 6" step 5x each; 6" step alt feet 10x with CGA and no UE support    cueing to shift weight            PT Education - 12/26/20 0926    Education Details discussion on benefits of improved HEP compliance and ways to find time to perform HEP; discussion on patient's current progress    Person(s) Educated Patient    Methods Explanation;Demonstration;Tactile cues;Verbal cues    Comprehension Verbalized understanding            PT Short Term Goals - 12/16/20 0954      PT SHORT TERM GOAL #1   Title Patient to be mod I with initial HEP.    Time 3    Period Weeks    Status Achieved    Target Date 12/09/20             PT Long Term Goals - 12/16/20 0954      PT LONG TERM GOAL #1   Title Patient to be mod I with advanced HEP.    Time 8    Period Weeks    Status On-going   reports "not consistent" d/t being "lazy I guess"     PT LONG TERM GOAL #2   Title Patient to demonstrate R shoulder AROM WFL and pain-free.    Time 8    Period Weeks    Status Partially Met   improved in all planes except abduction     PT LONG TERM GOAL #3   Title Patient to demonstrate R shoulder strength >/=4+/5.    Time 8    Period Weeks    Status Partially Met   improved in all planes     PT LONG TERM GOAL #4   Title Patient to report ability to put his hair into a ponytail without assistance.    Time 8    Period Weeks    Status Achieved      PT LONG TERM GOAL #5   Title Patient to score >47/56 on Berg in order to decrease risk of falls.    Time 8    Period Weeks    Status On-going   12/16/20: 39/56                Plan - 12/26/20 0927    Clinical Impression Statement Patient without new complaints today. Worked on sidelying R shoulder strengthening with patient demonstrating limited ROM in shoulder ABD; better ROM demonstrated with slightly heavier weight. Cueing provided to encourage muscle relaxation to address guarding. Also with limited ROM in shoulder ER, which improved with supine stretching to tolerance. Patient still admitting to poor HEP  tolerance, thus discussed benefits of HEP compliance for max benefit. Continued working on weight shifting to improve dynamic balance when tapping foot on step. Patient performed well at short step and able to transfer these skilled to taller weight quite well today. Patient performed gait training with practice turning with tendency to perform wide and slow turns. Patient quickly fatigued, thus provided sitting rest break. Patient without complaints at end of session. Progressing well towards goals.    Personal Factors and Comorbidities Age;Behavior Pattern;Comorbidity 3+;Fitness;Past/Current Experience;Time since onset of injury/illness/exacerbation;Transportation    Comorbidities stroke 2012, HTN, GERD, DM, R reverse TSA 2017    Examination-Activity Limitations Bathing;Bed Mobility;Bend;Squat;Stairs;Carry;Stand;Dressing;Transfers;Hygiene/Grooming;Lift;Locomotion Level;Reach Overhead    PT Frequency 2x / week    PT Duration 8 weeks    PT Treatment/Interventions ADLs/Self Care Home Management;Cryotherapy;Electrical Stimulation;Iontophoresis 10m/ml Dexamethasone;Moist Heat;Balance training;Therapeutic exercise;Therapeutic activities;Functional mobility training;Stair training;Gait training;DME Instruction;Ultrasound;Neuromuscular re-education;Patient/family education;Manual techniques;Vasopneumatic Device;Taping;Energy conservation;Dry needling;Passive range of motion;Scar mobilization    PT Next Visit Plan progress R shoulder PROM/AAROM/AROM to tolerance; balance training working towards SLS activities and turning    Consulted and Agree with Plan of Care Patient    Family Member Consulted wife           Patient will benefit from skilled therapeutic intervention in order to improve the following deficits and impairments:  Abnormal gait,Decreased endurance,Hypomobility,Decreased scar mobility,Increased edema,Decreased knowledge of precautions,Decreased activity tolerance,Decreased strength,Increased  fascial restricitons,Impaired UE functional use,Pain,Decreased balance,Decreased mobility,Difficulty walking,Increased muscle spasms,Improper body mechanics,Decreased range of motion,Impaired flexibility,Postural dysfunction  Visit Diagnosis: Stiffness of right shoulder, not elsewhere classified  History of falling  Unsteadiness on feet  Muscle weakness (generalized)  Acute pain of right shoulder     Problem List Patient Active Problem List   Diagnosis Date Noted  . S/P ORIF (open reduction internal fixation) fracture 09/26/2020  . 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

## 2020-12-30 ENCOUNTER — Other Ambulatory Visit: Payer: Self-pay

## 2020-12-30 ENCOUNTER — Ambulatory Visit: Payer: Medicare Other

## 2020-12-30 DIAGNOSIS — M25611 Stiffness of right shoulder, not elsewhere classified: Secondary | ICD-10-CM | POA: Diagnosis not present

## 2020-12-30 DIAGNOSIS — Z9181 History of falling: Secondary | ICD-10-CM

## 2020-12-30 DIAGNOSIS — M6281 Muscle weakness (generalized): Secondary | ICD-10-CM

## 2020-12-30 DIAGNOSIS — R2681 Unsteadiness on feet: Secondary | ICD-10-CM

## 2020-12-30 DIAGNOSIS — M25511 Pain in right shoulder: Secondary | ICD-10-CM

## 2020-12-30 NOTE — Therapy (Signed)
[facility] [address]  [place], [place], [address] Phone: [phone]   Fax:  [phone]  Physical Therapy Treatment  Patient Details  Name: [name] Soto MRN: [id number] Date of Birth: 31-May-1946 Referring Provider (PT): Justice Britain, MD   Encounter Date: 12/30/2020   PT End of Session - 12/30/20 0937    Visit Number 12    Number of Visits 17    Date for PT Re-Evaluation 01/13/21    Authorization Type Medicare & BCBS    PT Start Time [phone]    PT Stop Time 0930    PT Time Calculation (min) 41 min    Equipment Utilized During Treatment Gait belt    Activity Tolerance Patient tolerated treatment well    Behavior During Therapy [facility] for tasks assessed/performed           Past Medical History:  Diagnosis Date  . Cerebral thrombosis with cerebral infarction ([place])   . Diabetes mellitus without complication ([place])   . GERD (gastroesophageal reflux disease)   . Humerus fracture    PROXIMAL RIGHT  . Hypertension   . Pancreatic lesion   . Stroke [facility])     Past Surgical History:  Procedure Laterality Date  . CATARACT EXTRACTION     RIGHT  . EUS N/A 07/17/2015   Procedure: ESOPHAGEAL ENDOSCOPIC ULTRASOUND (EUS) RADIAL;  Surgeon: Arta Silence, MD;  [facility];  Service: Endoscopy;  Laterality: N/A;  . FINE NEEDLE ASPIRATION N/A 07/17/2015   Procedure: FINE NEEDLE ASPIRATION (FNA) RADIAL;  Surgeon: Arta Silence, MD;  [facility];  Service: Endoscopy;  Laterality: N/A;  . HEMATOMA DRAINED    . HERNIA REPAIR     inguinal right  . HYDROCELE EXCISION    . ORIF HUMERUS FRACTURE Right 09/26/2020   Procedure: Open reduction internal fixation right humeral periprosthetic fracture;  Surgeon: Justice Britain, MD;  [facility]: [facility];  Service: Orthopedics;  Laterality: Right;  111mn  . REVERSE SHOULDER ARTHROPLASTY Right 05/07/2016   Procedure: RIGHT REVERSE TOTAL SHOULDER ARTHROPLASTY;  Surgeon:  KJustice Britain MD;  [place] [place]  Service: Orthopedics;  Laterality: Right;    There were no vitals filed for this visit.   Subjective Assessment - 12/30/20 0851    Subjective Pt reports that the shoulder is about the same.    Pertinent History stroke, HTN, GERD, DM, R reverse TSA 2017    Diagnostic tests per wife- recent xrays on 11/06/20 showed good healing    Patient Stated Goals work on mobility and balance    Currently in Pain? No/denies                             [facility] - 12/30/20 0001      Ambulation/Gait   Ambulation/Gait Yes    Ambulation Distance (Feet) 200 Feet    Assistive device Straight cane    Gait Pattern Step-to pattern;Decreased step length - right;Decreased step length - left;Trunk flexed;Lateral trunk lean to right;Lateral trunk lean to left;Poor foot clearance - left      Shoulder Exercises: Supine   External Rotation AAROM;10 reps;Right    External Rotation Limitations therapist assist    Internal Rotation AAROM;Right;10 reps    Internal Rotation Limitations therapist assist      Shoulder Exercises: Seated   Flexion AAROM;Both;10 reps    Flexion Limitations cane    Abduction AAROM;Right;10 reps  ABduction Limitations cane      Shoulder Exercises: Pulleys   Flexion 3 minutes    Flexion Limitations to tolerance    Scaption 3 minutes    Scaption Limitations to tolerance      Manual Therapy   Manual Therapy Passive ROM;Soft tissue mobilization;Myofascial release    Manual therapy comments supine    Soft tissue mobilization STM to R biceps muscle belly, anterior delt    Myofascial Release manual TPR to R proximal biceps and biceps muscle belly, anterior delt    Passive ROM R shoulder PROM in all directions to tolerance with prolonged holds                    PT Short Term Goals - 12/16/20 0954      PT SHORT TERM GOAL #1   Title Patient to be mod I with initial HEP.    Time 3    Period  Weeks    Status Achieved    Target Date 12/09/20             PT Long Term Goals - 12/16/20 0954      PT LONG TERM GOAL #1   Title Patient to be mod I with advanced HEP.    Time 8    Period Weeks    Status On-going   reports "not consistent" d/t being "lazy I guess"     PT LONG TERM GOAL #2   Title Patient to demonstrate R shoulder AROM WFL and pain-free.    Time 8    Period Weeks    Status Partially Met   improved in all planes except abduction     PT LONG TERM GOAL #3   Title Patient to demonstrate R shoulder strength >/=4+/5.    Time 8    Period Weeks    Status Partially Met   improved in all planes     PT LONG TERM GOAL #4   Title Patient to report ability to put his hair into a ponytail without assistance.    Time 8    Period Weeks    Status Achieved      PT LONG TERM GOAL #5   Title Patient to score >47/56 on Berg in order to decrease risk of falls.    Time 8    Period Weeks    Status On-going   12/16/20: 39/56                Plan - 12/30/20 0938    Clinical Impression Statement Pt responded well to treatment, he continues to have TTP in the ant deltoid and biceps muscle belly. Pt limited a lot in IR during MT.  Observed shoulder ROM flex and abd showing some improvements even though formal measurements not taken. Worked on gait training introducing changes in direction, he showed unsteadiness when quickly changing directions and when turning in a complete circle, also required cues to pace while walking to reset gait pattern. Good response from treatment.    Personal Factors and Comorbidities Age;Behavior Pattern;Comorbidity 3+;Fitness;Past/Current Experience;Time since onset of injury/illness/exacerbation;Transportation    Comorbidities stroke 2012, HTN, GERD, DM, R reverse TSA 2017    PT Frequency 2x / week    PT Duration 8 weeks    PT Treatment/Interventions ADLs/Self Care Home Management;Cryotherapy;Electrical Stimulation;Iontophoresis 85m/ml  Dexamethasone;Moist Heat;Balance training;Therapeutic exercise;Therapeutic activities;Functional mobility training;Stair training;Gait training;DME Instruction;Ultrasound;Neuromuscular re-education;Patient/family education;Manual techniques;Vasopneumatic Device;Taping;Energy conservation;Dry needling;Passive range of motion;Scar mobilization    PT Next Visit Plan progress R shoulder  PROM/AAROM/AROM to tolerance; balance training working towards SLS activities and turning    Consulted and Agree with Plan of Care Patient           Patient will benefit from skilled therapeutic intervention in order to improve the following deficits and impairments:  Abnormal gait,Decreased endurance,Hypomobility,Decreased scar mobility,Increased edema,Decreased knowledge of precautions,Decreased activity tolerance,Decreased strength,Increased fascial restricitons,Impaired UE functional use,Pain,Decreased balance,Decreased mobility,Difficulty walking,Increased muscle spasms,Improper body mechanics,Decreased range of motion,Impaired flexibility,Postural dysfunction  Visit Diagnosis: Stiffness of right shoulder, not elsewhere classified  History of falling  Unsteadiness on feet  Muscle weakness (generalized)  Acute pain of right shoulder     Problem List Patient Active Problem List   Diagnosis Date Noted  . S/P ORIF (open reduction internal fixation) fracture 09/26/2020  . 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  [place], [place], [address] Phone: [phone]   Fax:  [phone]  Name: [name] Soto MRN: [id number] Date of Birth: 07/10/1946

## 2021-01-02 ENCOUNTER — Ambulatory Visit: Payer: Medicare Other | Admitting: Physical Therapy

## 2021-01-02 ENCOUNTER — Encounter: Payer: Self-pay | Admitting: Physical Therapy

## 2021-01-02 ENCOUNTER — Other Ambulatory Visit: Payer: Self-pay

## 2021-01-02 DIAGNOSIS — M6281 Muscle weakness (generalized): Secondary | ICD-10-CM

## 2021-01-02 DIAGNOSIS — M25611 Stiffness of right shoulder, not elsewhere classified: Secondary | ICD-10-CM

## 2021-01-02 DIAGNOSIS — M25511 Pain in right shoulder: Secondary | ICD-10-CM

## 2021-01-02 DIAGNOSIS — R2681 Unsteadiness on feet: Secondary | ICD-10-CM

## 2021-01-02 DIAGNOSIS — Z9181 History of falling: Secondary | ICD-10-CM

## 2021-01-02 NOTE — Therapy (Signed)
[facility] [address]  [place], [place], [address] Phone: [phone]   Fax:  [phone]  Physical Therapy Treatment  Patient Details  Name: [name] Soto MRN: [id number] Date of Birth: [birth year] Referring Provider (PT): Justice Britain, MD   Encounter Date: 01/02/2021   PT End of Session - 01/02/21 0932    Visit Number 13    Number of Visits 17    Date for PT Re-Evaluation 01/13/21    Authorization Type Medicare & BCBS    PT Start Time [phone]    PT Stop Time 0930    PT Time Calculation (min) 41 min    Equipment Utilized During Treatment Gait belt    Activity Tolerance Patient tolerated treatment well    Behavior During Therapy [facility] for tasks assessed/performed           Past Medical History:  Diagnosis Date  . Cerebral thrombosis with cerebral infarction ([place])   . Diabetes mellitus without complication ([place])   . GERD (gastroesophageal reflux disease)   . Humerus fracture    PROXIMAL RIGHT  . Hypertension   . Pancreatic lesion   . Stroke [facility]     Past Surgical History:  Procedure Laterality Date  . CATARACT EXTRACTION     RIGHT  . EUS N/A 07/17/2015   Procedure: ESOPHAGEAL ENDOSCOPIC ULTRASOUND (EUS) RADIAL;  Surgeon: Arta Silence, MD;  [facility];  Service: Endoscopy;  Laterality: N/A;  . FINE NEEDLE ASPIRATION N/A 07/17/2015   Procedure: FINE NEEDLE ASPIRATION (FNA) RADIAL;  Surgeon: Arta Silence, MD;  [facility];  Service: Endoscopy;  Laterality: N/A;  . HEMATOMA DRAINED    . HERNIA REPAIR     inguinal right  . HYDROCELE EXCISION    . ORIF HUMERUS FRACTURE Right 09/26/2020   Procedure: Open reduction internal fixation right humeral periprosthetic fracture;  Surgeon: Justice Britain, MD;  [facility];  Service: Orthopedics;  Laterality: Right;  15mn  . REVERSE SHOULDER ARTHROPLASTY Right 05/07/2016   Procedure: RIGHT REVERSE TOTAL SHOULDER ARTHROPLASTY;  Surgeon:  KJustice Britain MD;  [place]  Service: Orthopedics;  Laterality: Right;    There were no vitals filed for this visit.   Subjective Assessment - 01/02/21 0849    Subjective Has been "a little" better about doing his HEP. Shoulder is feeling "a whole lot better." Still having toruble reaching behind his back, however this was evident at PLOF.    Patient is accompained by: Family member    Pertinent History stroke, HTN, GERD, DM, R reverse TSA 2017    Diagnostic tests per wife- recent xrays on 11/06/20 showed good healing    Patient Stated Goals work on mobility and balance    Currently in Pain? No/denies                             [facility] - 01/02/21 0001      Shoulder Exercises: Seated   Flexion Strengthening;Right;10 reps;Theraband    Theraband Level (Shoulder Flexion) Level 1 (Yellow)    Flexion Limitations thumb up   cues to maintain elbow straight and depress shoulder   Abduction Strengthening;Right;10 reps;Theraband    Theraband Level (Shoulder ABduction) Level 1 (Yellow)    ABduction Limitations thumb up    Other Seated Exercises R shoulder scaption with yellow TB x10      Shoulder Exercises: Standing   Other Standing Exercises R  shoulder IR AAROM with wand 10x3"; R shoulder extension AAROM with wand 10x3"   cueing to avoid anterior trunk lean and trunnk rotation, respectively     Shoulder Exercises: Pulleys   Flexion 3 minutes    Flexion Limitations to tolerance    Scaption 3 minutes    Scaption Limitations to tolerance               Balance Exercises - 01/02/21 0001      Balance Exercises: Standing   Turning Right;Left   standing on foam + R/L trunk rotation with fence post at chest 10x each w/ CGA   Other Standing Exercises gait training + R/L 360 deg turns   difficulty completing full turns, better ability to perform narrow turn   Other Standing Exercises Comments R/L step up/back to airex x4 each with CGA    heavy cues to shift to standing LE before stepping foot; pt with tendency to demonstrate short step length and excessive B toe out              PT Short Term Goals - 12/16/20 0954      PT SHORT TERM GOAL #1   Title Patient to be mod I with initial HEP.    Time 3    Period Weeks    Status Achieved    Target Date 12/09/20             PT Long Term Goals - 12/16/20 0954      PT LONG TERM GOAL #1   Title Patient to be mod I with advanced HEP.    Time 8    Period Weeks    Status On-going   reports "not consistent" d/t being "lazy I guess"     PT LONG TERM GOAL #2   Title Patient to demonstrate R shoulder AROM WFL and pain-free.    Time 8    Period Weeks    Status Partially Met   improved in all planes except abduction     PT LONG TERM GOAL #3   Title Patient to demonstrate R shoulder strength >/=4+/5.    Time 8    Period Weeks    Status Partially Met   improved in all planes     PT LONG TERM GOAL #4   Title Patient to report ability to put his hair into a ponytail without assistance.    Time 8    Period Weeks    Status Achieved      PT LONG TERM GOAL #5   Title Patient to score >47/56 on Berg in order to decrease risk of falls.    Time 8    Period Weeks    Status On-going   12/16/20: 39/56                Plan - 01/02/21 0933    Clinical Impression Statement Patient without complaints today. Notes that his R shoulder is feeling "a whole lot better." Notes that he is still having trouble reaching behind his back, however this was evident at PLOF. Worked on resisted shoulder flexion, scaption, and abduction with cueing to maintain elbow straight and depress shoulder. ROM most limited in frontal plane with these activities. Patient tolerated gentle shoulder IR and extension AAROM but with compensations and limited ROM evident. Discussed patient's difficulty with maintaining balance, with patient reporting improvement in steadiness but still reporting some  unsteadiness with ambulation. Worked on maintaining balance with torso rotation, which patient performed with mild unsteadiness.  More challenge demonstrated when stepping on/off airex pad, thus worked on this activity. Patient required heavy cues to shift to standing LE before stepping foot with tendency to demonstrate short step length and excessive B toe out. Patient was encouraged to increase HEP compliance at home. Patient reported understanding and without complaints at end of session.    Personal Factors and Comorbidities Age;Behavior Pattern;Comorbidity 3+;Fitness;Past/Current Experience;Time since onset of injury/illness/exacerbation;Transportation    Comorbidities stroke 2012, HTN, GERD, DM, R reverse TSA 2017    PT Frequency 2x / week    PT Duration 8 weeks    PT Treatment/Interventions ADLs/Self Care Home Management;Cryotherapy;Electrical Stimulation;Iontophoresis 66m/ml Dexamethasone;Moist Heat;Balance training;Therapeutic exercise;Therapeutic activities;Functional mobility training;Stair training;Gait training;DME Instruction;Ultrasound;Neuromuscular re-education;Patient/family education;Manual techniques;Vasopneumatic Device;Taping;Energy conservation;Dry needling;Passive range of motion;Scar mobilization    PT Next Visit Plan progress R shoulder PROM/AAROM/AROM to tolerance; balance training working towards SLS activities and turning    Consulted and Agree with Plan of Care Patient           Patient will benefit from skilled therapeutic intervention in order to improve the following deficits and impairments:  Abnormal gait,Decreased endurance,Hypomobility,Decreased scar mobility,Increased edema,Decreased knowledge of precautions,Decreased activity tolerance,Decreased strength,Increased fascial restricitons,Impaired UE functional use,Pain,Decreased balance,Decreased mobility,Difficulty walking,Increased muscle spasms,Improper body mechanics,Decreased range of motion,Impaired  flexibility,Postural dysfunction  Visit Diagnosis: Stiffness of right shoulder, not elsewhere classified  History of falling  Unsteadiness on feet  Muscle weakness (generalized)  Acute pain of right shoulder     Problem List Patient Active Problem List   Diagnosis Date Noted  . S/P ORIF (open reduction internal fixation) fracture 09/26/2020  . 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 [place] [address]Phone: [phone]  Fax:  [phone] Name: JJAQUARIUS SEDERMRN: [id number] of Birth: 201/18/1947

## 2021-01-06 ENCOUNTER — Other Ambulatory Visit: Payer: Self-pay

## 2021-01-06 ENCOUNTER — Ambulatory Visit: Payer: Medicare Other

## 2021-01-06 DIAGNOSIS — M25511 Pain in right shoulder: Secondary | ICD-10-CM

## 2021-01-06 DIAGNOSIS — Z9181 History of falling: Secondary | ICD-10-CM

## 2021-01-06 DIAGNOSIS — M25611 Stiffness of right shoulder, not elsewhere classified: Secondary | ICD-10-CM | POA: Diagnosis not present

## 2021-01-06 DIAGNOSIS — M6281 Muscle weakness (generalized): Secondary | ICD-10-CM

## 2021-01-06 DIAGNOSIS — R2681 Unsteadiness on feet: Secondary | ICD-10-CM

## 2021-01-06 NOTE — Therapy (Signed)
[facility] [address]  [place], [place], [address] Phone: [phone]   Fax:  [phone]  Physical Therapy Treatment  Patient Details  Name: [name] Soto MRN: [id number] Date of Birth: April 22, 1946 Referring Provider (PT): Justice Britain, MD   Encounter Date: 01/06/2021   PT End of Session - 01/06/21 0950    Visit Number 14    Number of Visits 17    Date for PT Re-Evaluation 01/13/21    Authorization Type Medicare & BCBS    PT Start Time [phone]    PT Stop Time 0932    PT Time Calculation (min) 38 min    Equipment Utilized During Treatment Gait belt    Activity Tolerance Patient tolerated treatment well    Behavior During Therapy [facility] for tasks assessed/performed           Past Medical History:  Diagnosis Date  . Cerebral thrombosis with cerebral infarction ([place])   . Diabetes mellitus without complication ([place])   . GERD (gastroesophageal reflux disease)   . Humerus fracture    PROXIMAL RIGHT  . Hypertension   . Pancreatic lesion   . Stroke [facility])     Past Surgical History:  Procedure Laterality Date  . CATARACT EXTRACTION     RIGHT  . EUS N/A 07/17/2015   Procedure: ESOPHAGEAL ENDOSCOPIC ULTRASOUND (EUS) RADIAL;  Surgeon: Arta Silence, MD;  [facility];  Service: Endoscopy;  Laterality: N/A;  . FINE NEEDLE ASPIRATION N/A 07/17/2015   Procedure: FINE NEEDLE ASPIRATION (FNA) RADIAL;  Surgeon: Arta Silence, MD;  [facility];  Service: Endoscopy;  Laterality: N/A;  . HEMATOMA DRAINED    . HERNIA REPAIR     inguinal right  . HYDROCELE EXCISION    . ORIF HUMERUS FRACTURE Right 09/26/2020   Procedure: Open reduction internal fixation right humeral periprosthetic fracture;  Surgeon: Justice Britain, MD;  [facility];  Service: Orthopedics;  Laterality: Right;  164mn  . REVERSE SHOULDER ARTHROPLASTY Right 05/07/2016   Procedure: RIGHT REVERSE TOTAL SHOULDER ARTHROPLASTY;  Surgeon:  KJustice Britain MD;  [place]  Service: Orthopedics;  Laterality: Right;    There were no vitals filed for this visit.   Subjective Assessment - 01/06/21 0855    Subjective Shoulder is doing good. Notices a little more motion with less soreness.    Pertinent History stroke, HTN, GERD, DM, R reverse TSA 2017    Diagnostic tests per wife- recent xrays on 11/06/20 showed good healing    Patient Stated Goals work on mobility and balance    Currently in Pain? No/denies                             [facility] - 01/06/21 0001      Ambulation/Gait   Ambulation/Gait Yes    Gait Pattern Step-to pattern;Decreased step length - right;Decreased step length - left;Trunk flexed;Lateral trunk lean to right;Lateral trunk lean to left;Poor foot clearance - left    Gait Comments walking in and out of cones 4x 10 feet; side stepping in and out of cones 4x10 feet; gait with head turns, 360 deg turning 50 ft 3x      Shoulder Exercises: Seated   Flexion Strengthening;Right;10 reps;Theraband    Theraband Level (Shoulder Flexion) Level 1 (Yellow)    Flexion Limitations thumb up   cues to keep shoulders depressed and elbow extended   Abduction Strengthening;Right;10  reps;Theraband    Theraband Level (Shoulder ABduction) Level 1 (Yellow)    ABduction Limitations thumb up   cues to keep shoulders depressed and elbow extended   Other Seated Exercises R shld flex and abd AAROM with cane 10 reps each      Shoulder Exercises: Pulleys   Flexion 2 minutes    Flexion Limitations to tolerance    Scaption 2 minutes    Scaption Limitations to tolerance      Manual Therapy   Manual Therapy Passive ROM    Manual therapy comments supine    Passive ROM R shoulder PROM in all directions to tolerance with prolonged holds                    PT Short Term Goals - 12/16/20 0954      PT SHORT TERM GOAL #1   Title Patient to be mod I with initial HEP.    Time 3     Period Weeks    Status Achieved    Target Date 12/09/20             PT Long Term Goals - 12/16/20 0954      PT LONG TERM GOAL #1   Title Patient to be mod I with advanced HEP.    Time 8    Period Weeks    Status On-going   reports "not consistent" d/t being "lazy I guess"     PT LONG TERM GOAL #2   Title Patient to demonstrate R shoulder AROM WFL and pain-free.    Time 8    Period Weeks    Status Partially Met   improved in all planes except abduction     PT LONG TERM GOAL #3   Title Patient to demonstrate R shoulder strength >/=4+/5.    Time 8    Period Weeks    Status Partially Met   improved in all planes     PT LONG TERM GOAL #4   Title Patient to report ability to put his hair into a ponytail without assistance.    Time 8    Period Weeks    Status Achieved      PT LONG TERM GOAL #5   Title Patient to score >47/56 on Berg in order to decrease risk of falls.    Time 8    Period Weeks    Status On-going   12/16/20: 39/56                Plan - 01/06/21 0951    Clinical Impression Statement Pt reports feeling like he has increased motion at his shoulder, also noting less discomfort in his ant deltoid/ biceps area. He needed a lot of cues to keep shoulders depressed during the shld flex and abd with theraband. Good response during MT reporting no pain with any of the motions. Pt still showing difficulty turning head and turning around while walking w/o having some LOB or coming to a complete stop. Required cues to pace himself to avoid running into cones. Also required min assist with ait belt for safety and stability.    Personal Factors and Comorbidities Age;Behavior Pattern;Comorbidity 3+;Fitness;Past/Current Experience;Time since onset of injury/illness/exacerbation;Transportation    Comorbidities stroke 2012, HTN, GERD, DM, R reverse TSA 2017    PT Frequency 2x / week    PT Duration 8 weeks    PT Treatment/Interventions ADLs/Self Care Home  Management;Cryotherapy;Electrical Stimulation;Iontophoresis 70m/ml Dexamethasone;Moist Heat;Balance training;Therapeutic exercise;Therapeutic activities;Functional mobility training;Stair training;Gait training;DME  Instruction;Ultrasound;Neuromuscular re-education;Patient/family education;Manual techniques;Vasopneumatic Device;Taping;Energy conservation;Dry needling;Passive range of motion;Scar mobilization    PT Next Visit Plan progress R shoulder PROM/AAROM/AROM to tolerance; balance training working towards SLS activities and turning    Consulted and Agree with Plan of Care Patient           Patient will benefit from skilled therapeutic intervention in order to improve the following deficits and impairments:  Abnormal gait,Decreased endurance,Hypomobility,Decreased scar mobility,Increased edema,Decreased knowledge of precautions,Decreased activity tolerance,Decreased strength,Increased fascial restricitons,Impaired UE functional use,Pain,Decreased balance,Decreased mobility,Difficulty walking,Increased muscle spasms,Improper body mechanics,Decreased range of motion,Impaired flexibility,Postural dysfunction  Visit Diagnosis: Stiffness of right shoulder, not elsewhere classified  History of falling  Unsteadiness on feet  Muscle weakness (generalized)  Acute pain of right shoulder     Problem List Patient Active Problem List   Diagnosis Date Noted  . S/P ORIF (open reduction internal fixation) fracture 09/26/2020  . 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    Artist Pais, PTA 01/06/2021, 12:20 PM  [facility] [address]  [place], [place], [address] Phone: [phone]   Fax:  [phone]  Name: [name] Soto MRN: [id number] Date of Birth: 11/15/1945

## 2021-01-09 ENCOUNTER — Encounter: Payer: Self-pay | Admitting: Physical Therapy

## 2021-01-09 ENCOUNTER — Ambulatory Visit: Payer: Medicare Other | Admitting: Physical Therapy

## 2021-01-09 ENCOUNTER — Other Ambulatory Visit: Payer: Self-pay

## 2021-01-09 DIAGNOSIS — M25611 Stiffness of right shoulder, not elsewhere classified: Secondary | ICD-10-CM | POA: Diagnosis not present

## 2021-01-09 DIAGNOSIS — R2681 Unsteadiness on feet: Secondary | ICD-10-CM

## 2021-01-09 DIAGNOSIS — M6281 Muscle weakness (generalized): Secondary | ICD-10-CM

## 2021-01-09 DIAGNOSIS — M25511 Pain in right shoulder: Secondary | ICD-10-CM

## 2021-01-09 DIAGNOSIS — Z9181 History of falling: Secondary | ICD-10-CM

## 2021-01-09 NOTE — Therapy (Signed)
[facility] [address]  [place], [place], [address] Phone: [phone]   Fax:  [phone]  Physical Therapy Discharge Summary  Patient Details  Name: [name] Soto MRN: [id number] Date of Birth: 06-04-1946 Referring Provider (PT): Justice Britain, MD   Progress Note Reporting Period 12/19/20 to 01/09/21  See note below for Objective Data and Assessment of Progress/Goals.     Encounter Date: 01/09/2021   PT End of Session - 01/09/21 0936    Visit Number 15    Number of Visits 17    Date for PT Re-Evaluation 01/13/21    Authorization Type Medicare & BCBS    PT Start Time 0847    PT Stop Time 0927    PT Time Calculation (min) 40 min    Equipment Utilized During Treatment Gait belt    Activity Tolerance Patient tolerated treatment well    Behavior During Therapy WFL for tasks assessed/performed           Past Medical History:  Diagnosis Date  . Cerebral thrombosis with cerebral infarction ([place])   . Diabetes mellitus without complication ([place])   . GERD (gastroesophageal reflux disease)   . Humerus fracture    PROXIMAL RIGHT  . Hypertension   . Pancreatic lesion   . Stroke [facility])     Past Surgical History:  Procedure Laterality Date  . CATARACT EXTRACTION     RIGHT  . EUS N/A 07/17/2015   Procedure: ESOPHAGEAL ENDOSCOPIC ULTRASOUND (EUS) RADIAL;  Surgeon: Arta Silence, MD;  Location: [facility];  Service: Endoscopy;  Laterality: N/A;  . FINE NEEDLE ASPIRATION N/A 07/17/2015   Procedure: FINE NEEDLE ASPIRATION (FNA) RADIAL;  Surgeon: Arta Silence, MD;  Location: [facility];  Service: Endoscopy;  Laterality: N/A;  . HEMATOMA DRAINED    . HERNIA REPAIR     inguinal right  . HYDROCELE EXCISION    . ORIF HUMERUS FRACTURE Right 09/26/2020   Procedure: Open reduction internal fixation right humeral periprosthetic fracture;  Surgeon: Justice Britain, MD;  Location: [facility];  Service: Orthopedics;   Laterality: Right;  120mn  . REVERSE SHOULDER ARTHROPLASTY Right 05/07/2016   Procedure: RIGHT REVERSE TOTAL SHOULDER ARTHROPLASTY;  Surgeon: KJustice Britain MD;  [facility]  Service: Orthopedics;  Laterality: Right;    There were no vitals filed for this visit.   Subjective Assessment - 01/09/21 0848    Subjective Feels like he is almost where he needs to be. Denies any issues with his arm now. Feels that his balance is a little better. Has not done his HEP all week.    Pertinent History stroke, HTN, GERD, DM, R reverse TSA 2017    Diagnostic tests per wife- recent xrays on 11/06/20 showed good healing    Patient Stated Goals work on mobility and balance    Currently in Pain? No/denies              [facility] - 01[facility] 0851      Assessment   Medical Diagnosis Encounter for other orthopedic aftercare; R humeral periprosthetic fx of R reverse TSA    Referring Provider (PT) KJustice Britain MD    Onset Date/Surgical Date 09/26/20      AROM   Right Shoulder Flexion 133 Degrees    Right Shoulder ABduction 98 Degrees    Right Shoulder Internal Rotation --   FIR iliac crest   Right Shoulder External Rotation --   FER C7  Strength   Right Shoulder Flexion 4+/5    Right Shoulder ABduction 4-/5    Right Shoulder Internal Rotation 4+/5    Right Shoulder External Rotation 4+/5      Berg Balance Test   Sit to Stand Able to stand without using hands and stabilize independently    Standing Unsupported Able to stand safely 2 minutes    Sitting with Back Unsupported but Feet Supported on Floor or Stool Able to sit safely and securely 2 minutes    Stand to Sit Sits safely with minimal use of hands    Transfers Able to transfer safely, minor use of hands    Standing Unsupported with Eyes Closed Able to stand 10 seconds safely    Standing Unsupported with Feet Together Able to place feet together independently and stand 1 minute safely    From Standing, Reach Forward with  Outstretched Arm Can reach forward >12 cm safely (5")    From Standing Position, Pick up Object from Floor Able to pick up shoe safely and easily    From Standing Position, Turn to Look Behind Over each Shoulder Looks behind from both sides and weight shifts well    Turn 360 Degrees Able to turn 360 degrees safely but slowly    Standing Unsupported, Alternately Place Feet on Step/Stool Able to complete 4 steps without aid or supervision    Standing Unsupported, One Foot in Front Needs help to step but can hold 15 seconds    Standing on One Leg Tries to lift leg/unable to hold 3 seconds but remains standing independently    Total Score 45                         [facility] Adult PT Treatment/Exercise - 01/09/21 0001      Shoulder Exercises: Pulleys   Flexion 3 minutes    Flexion Limitations to tolerance    Scaption 3 minutes    Scaption Limitations to tolerance                  PT Education - 01/09/21 0935    Education Details discussion on objective progress and remaining deficits; update to HEP- edu on use of over the Soil scientist) Educated Patient    Methods Explanation;Demonstration;Tactile cues;Verbal cues;Handout    Comprehension Verbalized understanding;Returned demonstration            PT Short Term Goals - 01/09/21 0936      PT SHORT TERM GOAL #1   Title Patient to be mod I with initial HEP.    Time 3    Period Weeks    Status Achieved    Target Date 12/09/20             PT Long Term Goals - 01/09/21 0956      PT LONG TERM GOAL #1   Title Patient to be mod I with advanced HEP.    Time 8    Period Weeks    Status Achieved   denies questions but admits to noncompliance     PT LONG TERM GOAL #2   Title Patient to demonstrate R shoulder AROM WFL and pain-free.    Time 8    Period Weeks    Status Partially Met   pain-free, but still limited     PT LONG TERM GOAL #3   Title Patient to demonstrate R shoulder strength >/=4+/5.     Time 8  Period Weeks    Status Achieved      PT LONG TERM GOAL #4   Title Patient to report ability to put his hair into a ponytail without assistance.    Time 8    Period Weeks    Status Achieved      PT LONG TERM GOAL #5   Title Patient to score >47/56 on Berg in order to decrease risk of falls.    Time 8    Period Weeks    Status Partially Met   01/09/21: 45/56                Plan - 01/09/21 0936    Clinical Impression Statement Patient arrived to session with report of improvement in balance and no considerable deficits remaining in hi R shoulder. Admits to HEP noncompliance. Strength goal met in R shoulder. AROM has improved in shoulder abduction and IR, but still with considerable limitations in shoulder ROM which may have been present at PLOF. Berg balance has improved to 45/56 today, indicating a decreased risk of falls and nearly meeting this goal. Updated HEP with minimal exercises to address remaining deficits and educated patient on importance of HEP compliance for max benefit. Patient reported understanding and requested DC at this time.    Personal Factors and Comorbidities Age;Behavior Pattern;Comorbidity 3+;Fitness;Past/Current Experience;Time since onset of injury/illness/exacerbation;Transportation    Comorbidities stroke 2012, HTN, GERD, DM, R reverse TSA 2017    PT Frequency 2x / week    PT Duration 8 weeks    PT Treatment/Interventions ADLs/Self Care Home Management;Cryotherapy;Electrical Stimulation;Iontophoresis 4m/ml Dexamethasone;Moist Heat;Balance training;Therapeutic exercise;Therapeutic activities;Functional mobility training;Stair training;Gait training;DME Instruction;Ultrasound;Neuromuscular re-education;Patient/family education;Manual techniques;Vasopneumatic Device;Taping;Energy conservation;Dry needling;Passive range of motion;Scar mobilization    PT Next Visit Plan DC at this time    Consulted and Agree with Plan of Care Patient            Patient will benefit from skilled therapeutic intervention in order to improve the following deficits and impairments:  Abnormal gait,Decreased endurance,Hypomobility,Decreased scar mobility,Increased edema,Decreased knowledge of precautions,Decreased activity tolerance,Decreased strength,Increased fascial restricitons,Impaired UE functional use,Pain,Decreased balance,Decreased mobility,Difficulty walking,Increased muscle spasms,Improper body mechanics,Decreased range of motion,Impaired flexibility,Postural dysfunction  Visit Diagnosis: Stiffness of right shoulder, not elsewhere classified  History of falling  Unsteadiness on feet  Muscle weakness (generalized)  Acute pain of right shoulder     Problem List Patient Active Problem List   Diagnosis Date Noted  . S/P ORIF (open reduction internal fixation) fracture 09/26/2020  . 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     PHYSICAL THERAPY DISCHARGE SUMMARY  Visits from Start of Care: 15  Current functional level related to goals / functional outcomes: See above clinical impression    Remaining deficits: Decreased R shoulder ROM, remaining imbalance    Education / Equipment: HEP  Plan: Patient agrees to discharge.  Patient goals were partially met. Patient is being discharged due to being pleased with the current functional level.  ?????     YJanene Harvey PT, DPT 01/09/21 11:58 AM     [facility] [place] [address]Phone: [phone]  Fax:  [phone] Name: [name] THURMANMRN: [id number] of Birth: [birth year]

## 2021-01-14 ENCOUNTER — Ambulatory Visit: Payer: Medicare Other

## 2021-01-23 ENCOUNTER — Encounter: Payer: Medicare Other | Admitting: Physical Therapy

## 2021-01-26 ENCOUNTER — Emergency Department (HOSPITAL_BASED_OUTPATIENT_CLINIC_OR_DEPARTMENT_OTHER)
Admission: EM | Admit: 2021-01-26 | Discharge: 2021-01-26 | Disposition: A | Discharge status: Home / Self Care | Payer: Medicare Other | Attending: Emergency Medicine | Admitting: Emergency Medicine

## 2021-01-26 ENCOUNTER — Encounter (HOSPITAL_BASED_OUTPATIENT_CLINIC_OR_DEPARTMENT_OTHER): Payer: Self-pay | Admitting: Emergency Medicine

## 2021-01-26 ENCOUNTER — Emergency Department (HOSPITAL_BASED_OUTPATIENT_CLINIC_OR_DEPARTMENT_OTHER): Payer: Medicare Other

## 2021-01-26 ENCOUNTER — Other Ambulatory Visit: Payer: Self-pay

## 2021-01-26 DIAGNOSIS — Z96611 Presence of right artificial shoulder joint: Secondary | ICD-10-CM | POA: Insufficient documentation

## 2021-01-26 DIAGNOSIS — I10 Essential (primary) hypertension: Secondary | ICD-10-CM | POA: Insufficient documentation

## 2021-01-26 DIAGNOSIS — R3915 Urgency of urination: Secondary | ICD-10-CM | POA: Diagnosis not present

## 2021-01-26 DIAGNOSIS — Z7984 Long term (current) use of oral hypoglycemic drugs: Secondary | ICD-10-CM | POA: Diagnosis not present

## 2021-01-26 DIAGNOSIS — N200 Calculus of kidney: Secondary | ICD-10-CM

## 2021-01-26 DIAGNOSIS — R1030 Lower abdominal pain, unspecified: Secondary | ICD-10-CM | POA: Diagnosis not present

## 2021-01-26 DIAGNOSIS — E119 Type 2 diabetes mellitus without complications: Secondary | ICD-10-CM | POA: Diagnosis not present

## 2021-01-26 DIAGNOSIS — R0602 Shortness of breath: Secondary | ICD-10-CM | POA: Insufficient documentation

## 2021-01-26 DIAGNOSIS — Z79899 Other long term (current) drug therapy: Secondary | ICD-10-CM | POA: Insufficient documentation

## 2021-01-26 DIAGNOSIS — R112 Nausea with vomiting, unspecified: Secondary | ICD-10-CM | POA: Diagnosis not present

## 2021-01-26 DIAGNOSIS — Z7982 Long term (current) use of aspirin: Secondary | ICD-10-CM | POA: Diagnosis not present

## 2021-01-26 DIAGNOSIS — K5900 Constipation, unspecified: Secondary | ICD-10-CM | POA: Insufficient documentation

## 2021-01-26 DIAGNOSIS — K219 Gastro-esophageal reflux disease without esophagitis: Secondary | ICD-10-CM | POA: Insufficient documentation

## 2021-01-26 DIAGNOSIS — Z87891 Personal history of nicotine dependence: Secondary | ICD-10-CM | POA: Insufficient documentation

## 2021-01-26 DIAGNOSIS — K862 Cyst of pancreas: Secondary | ICD-10-CM

## 2021-01-26 LAB — CBC WITH DIFFERENTIAL/PLATELET
Abs Immature Granulocytes: 0.04 10*3/uL (ref 0.00–0.07)
Basophils Absolute: 0 10*3/uL (ref 0.0–0.1)
Basophils Relative: 0 %
Eosinophils Absolute: 0 10*3/uL (ref 0.0–0.5)
Eosinophils Relative: 0 %
HCT: 42.3 % (ref 39.0–52.0)
Hemoglobin: 14.1 g/dL (ref 13.0–17.0)
Immature Granulocytes: 0 %
Lymphocytes Relative: 4 %
Lymphs Abs: 0.5 10*3/uL — ABNORMAL LOW (ref 0.7–4.0)
MCH: 28.1 pg (ref 26.0–34.0)
MCHC: 33.3 g/dL (ref 30.0–36.0)
MCV: 84.4 fL (ref 80.0–100.0)
Monocytes Absolute: 0.4 10*3/uL (ref 0.1–1.0)
Monocytes Relative: 3 %
Neutro Abs: 11.6 10*3/uL — ABNORMAL HIGH (ref 1.7–7.7)
Neutrophils Relative %: 93 %
Platelets: 182 10*3/uL (ref 150–400)
RBC: 5.01 MIL/uL (ref 4.22–5.81)
RDW: 16.1 % — ABNORMAL HIGH (ref 11.5–15.5)
WBC: 12.5 10*3/uL — ABNORMAL HIGH (ref 4.0–10.5)
nRBC: 0 % (ref 0.0–0.2)

## 2021-01-26 LAB — URINALYSIS, ROUTINE W REFLEX MICROSCOPIC
Bilirubin Urine: NEGATIVE
Glucose, UA: >=500 mg/dL — AB
Ketones, ur: 15 mg/dL — AB
Leukocytes,Ua: NEGATIVE
Nitrite: NEGATIVE
Protein, ur: NEGATIVE mg/dL
Specific Gravity, Urine: 1.01 (ref 1.005–1.030)
pH: 7 (ref 5.0–8.0)

## 2021-01-26 LAB — COMPREHENSIVE METABOLIC PANEL
ALT: 23 U/L (ref 0–44)
AST: 23 U/L (ref 15–41)
Albumin: 4.1 g/dL (ref 3.5–5.0)
Alkaline Phosphatase: 67 U/L (ref 38–126)
Anion gap: 12 (ref 5–15)
BUN: 18 mg/dL (ref 8–23)
CO2: 24 mmol/L (ref 22–32)
Calcium: 9.5 mg/dL (ref 8.9–10.3)
Chloride: 102 mmol/L (ref 98–111)
Creatinine, Ser: 1.12 mg/dL (ref 0.61–1.24)
GFR, Estimated: >60 mL/min (ref >60)
Glucose, Bld: 304 mg/dL — ABNORMAL HIGH (ref 70–99)
Potassium: 4 mmol/L (ref 3.5–5.1)
Sodium: 138 mmol/L (ref 135–145)
Total Bilirubin: 0.8 mg/dL (ref 0.3–1.2)
Total Protein: 7.9 g/dL (ref 6.5–8.1)

## 2021-01-26 LAB — URINALYSIS, MICROSCOPIC (REFLEX)

## 2021-01-26 MED ORDER — TAMSULOSIN HCL 0.4 MG PO CAPS: 0.4000 mg | ORAL_CAPSULE | Freq: Every day | ORAL | 0 refills | Status: DC

## 2021-01-26 MED ORDER — FENTANYL CITRATE (PF) 100 MCG/2ML IJ SOLN
100.0000 ug | Freq: Once | INTRAMUSCULAR | Status: AC
  Administered 2021-01-26: 100 ug via INTRAVENOUS
  Filled 2021-01-26: qty 2

## 2021-01-26 MED ORDER — HYDROCODONE-ACETAMINOPHEN 5-325 MG PO TABS: 1.0000 | ORAL_TABLET | ORAL | 0 refills | Status: DC | PRN

## 2021-01-26 MED ORDER — KETOROLAC TROMETHAMINE 30 MG/ML IJ SOLN
15.0000 mg | Freq: Once | INTRAMUSCULAR | Status: AC
  Administered 2021-01-26: 15 mg via INTRAVENOUS
  Filled 2021-01-26: qty 1

## 2021-01-26 MED ORDER — ONDANSETRON HCL 4 MG/2ML IJ SOLN
4.0000 mg | Freq: Once | INTRAMUSCULAR | Status: AC
  Administered 2021-01-26: 4 mg via INTRAVENOUS
  Filled 2021-01-26: qty 2

## 2021-01-26 NOTE — ED Notes (Signed)
Patient transported to CT via stretcher, sr x 2 up  

## 2021-01-26 NOTE — ED Notes (Signed)
Pt is now a HIGH FALL RISK due to mediation received, pt also noted to have a poor gait. SR x 2 up, fall risk bracelet placed on client, pt instructed not to get up without assistance from staff, wife reminded patient is being considered a fall risk pt

## 2021-01-26 NOTE — ED Provider Notes (Signed)
[facility] EMERGENCY DEPARTMENT Provider Note   CSN: [id number] Arrival date & time: 01/26/21  1353     History Chief Complaint  Patient presents with  . Groin Pain    [name] Soto is a 75 y.o. male.  75 year old male with past medical history below including CVA, type 2 diabetes mellitus, GERD, hypertension who presents with back pain and vomiting.  Approximately 1 hour prior to arrival, patient had sudden onset of pain across his lower abdomen/groin that has now moved into his right flank.  Pain is severe and has been associated with nausea and vomiting.  He reports mild constipation, no diarrhea.  He has had some urinary urgency but denies any dysuria or hematuria.  He has never had symptoms like this before and denies any history of kidney stones.  He reports some shortness of breath because of the severe pain, denies any chest pain.  No fevers or recent illness.  He took Tylenol earlier without relief. No testicular pain/swelling.  The history is provided by the patient and the spouse.  Groin Pain       Past Medical History:  Diagnosis Date  . Cerebral thrombosis with cerebral infarction ([facility])   . Diabetes mellitus without complication ([facility])   . GERD (gastroesophageal reflux disease)   . Humerus fracture    PROXIMAL RIGHT  . Hypertension   . Pancreatic lesion   . Stroke [facility])     Patient Active Problem List   Diagnosis Date Noted  . S/P ORIF (open reduction internal fixation) fracture 09/26/2020  . 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    Past Surgical History:  Procedure Laterality Date  . CATARACT EXTRACTION     RIGHT  . EUS N/A 07/17/2015   Procedure: ESOPHAGEAL ENDOSCOPIC ULTRASOUND (EUS) RADIAL;  Surgeon: Willis Modena, MD;  [facility];  Service: Endoscopy;  Laterality: N/A;  . FINE NEEDLE ASPIRATION N/A 07/17/2015   Procedure: FINE NEEDLE ASPIRATION (FNA) RADIAL;  Surgeon: Willis Modena, MD;  [facility];   Service: Endoscopy;  Laterality: N/A;  . HEMATOMA DRAINED    . HERNIA REPAIR     inguinal right  . HYDROCELE EXCISION    . ORIF HUMERUS FRACTURE Right 09/26/2020   Procedure: Open reduction internal fixation right humeral periprosthetic fracture;  Surgeon: Francena Hanly, MD;  [facility]: [facility];  Service: Orthopedics;  Laterality: Right;   . REVERSE SHOULDER ARTHROPLASTY Right 05/07/2016   Procedure: RIGHT REVERSE TOTAL SHOULDER ARTHROPLASTY;  Surgeon: Francena Hanly, MD;  Location: [facility];  Service: Orthopedics;  Laterality: Right;       No family history on file.  Social History   Tobacco Use  . Smoking status: Former Smoker    Types: Cigarettes  . Smokeless tobacco: Never Used  . Tobacco comment: QUIT SMOKING IN THE 70'S  Vaping Use  . Vaping Use: Never used  Substance Use Topics  . Alcohol use: No  . Drug use: No    Home Medications Prior to Admission medications   Medication Sig Start Date End Date Taking? Authorizing Provider  amLODipine (NORVASC) 5 MG tablet Take 5 mg by mouth every morning.    [provider]  aspirin 81 MG tablet Take 81 mg by mouth daily.    [provider]  atorvastatin (LIPITOR) 40 MG tablet Take 40 mg by mouth daily.    [provider]  doxycycline (MONODOX) 100 MG capsule Take 100 mg by mouth 2 (two) times daily. Patient not taking: Reported  on 11/18/2020    [provider]  HYDROcodone-acetaminophen (NORCO) 5-325 MG tablet Take 1 tablet by mouth every 4 (four) hours as needed. Patient not taking: Reported on 11/18/2020 09/27/20   Shuford, French Ana, PA-C  lisinopril (PRINIVIL,ZESTRIL) 40 MG tablet Take 40 mg by mouth daily.    [provider]  metFORMIN (GLUCOPHAGE) 500 MG tablet Take 500 mg by mouth 2 (two) times daily with a meal. 07/31/20   [provider]  Multiple Vitamin (MULTI-VITAMINS) TABS Take 1 tablet by mouth daily.    [provider]  zinc gluconate 50 MG tablet Take 50 mg by  mouth daily.    [provider]    Allergies    Metformin  Review of Systems   Review of Systems All other systems reviewed and are negative except that which was mentioned in HPI  Physical Exam Updated Vital Signs BP 136/72 (BP Location: Left Arm)   Pulse 85   Temp 98.4 F (36.9 C) (Oral)   Resp (!) 22   Ht 5\' 10"  (1.778 m)   Wt 94.3 kg   SpO2 97%   BMI 29.84 kg/m   Physical Exam Vitals and nursing note reviewed.  Constitutional:      General: He is not in acute distress.    Appearance: Normal appearance.     Comments: Uncomfortable, eyes closed  HENT:     Head: Normocephalic and atraumatic.     Mouth/Throat:     Mouth: Mucous membranes are dry.  Eyes:     Conjunctiva/sclera: Conjunctivae normal.  Cardiovascular:     Rate and Rhythm: Normal rate and regular rhythm.     Heart sounds: Normal heart sounds. No murmur heard.   Pulmonary:     Effort: Pulmonary effort is normal.     Breath sounds: Normal breath sounds.  Abdominal:     General: Abdomen is flat. Bowel sounds are normal. There is no distension.     Palpations: Abdomen is soft.     Tenderness: There is no abdominal tenderness. There is right CVA tenderness.  Musculoskeletal:     Right lower leg: No edema.     Left lower leg: No edema.  Skin:    General: Skin is warm and dry.  Neurological:     Mental Status: He is alert and oriented to person, place, and time.     Comments: fluent  Psychiatric:        Mood and Affect: Mood normal.        Behavior: Behavior normal.     ED Results / Procedures / Treatments   Labs (all labs ordered are listed, but only abnormal results are displayed) Labs Reviewed  URINE CULTURE  COMPREHENSIVE METABOLIC PANEL  URINALYSIS, ROUTINE W REFLEX MICROSCOPIC  CBC WITH DIFFERENTIAL/PLATELET    EKG None  Radiology No results found.  Procedures Procedures   Medications Ordered in ED Medications  fentaNYL (SUBLIMAZE) injection 100 mcg (has no  administration in time range)  ondansetron (ZOFRAN) injection 4 mg (has no administration in time range)    ED Course  I have reviewed the triage vital signs and the nursing notes.  Pertinent labs & imaging results that were available during my care of the patient were reviewed by me and considered in my medical decision making (see chart for details).    MDM Rules/Calculators/A&P                          Uncomfortable but  nontoxic on exam, afebrile, reassuring vital signs.  No focal abdominal tenderness but he did have CVA tenderness.  Gave Zofran and fentanyl and obtained lab work as well as CT to evaluate for kidney stone.  Differential includes ureteral stone, pyelonephritis, musculoskeletal back pain, pathologic fracture.  I am signing the patient out to the oncoming provider pending completion of work-up and reassessment. Final Clinical Impression(s) / ED Diagnoses Final diagnoses:  None    Rx / DC Orders ED Discharge Orders    None       Delissa Silba, Ambrose Finland, MD 01/26/21 1438

## 2021-01-26 NOTE — ED Notes (Signed)
Patient transported to CT 

## 2021-01-26 NOTE — ED Triage Notes (Signed)
Pt c/o groin pain and right sided back pain with nausea and vomiting x 1 hour.

## 2021-01-26 NOTE — ED Notes (Signed)
Pt awake, alert, talking, oxygen via [place] removed.

## 2021-01-26 NOTE — ED Provider Notes (Signed)
Patient is a 75 year old male who presents with flank pain.  CT scan reveals 7 mm distal right ureteral stone.  His urine does not appear to be infected.  His creatinine is normal.  His pain is well controlled in the ED.  He did have an episode where he got a little hypoxic and sedated to the fentanyl he had received.  He was monitored for several hours following this and as he metabolized he is maintaining normal oxygen saturations without supplemental oxygen.  He is fully alert and oriented.  He was discharged home in good condition.  He and his wife were counseled on following up with urology as an outpatient.  It is a large stone so I did advise him of the importance of urology follow-up and if it has not passed, he may need a procedure to remove it.  He was given a prescription for Vicodin and Flomax.  He has Zofran at home to use.  Return precautions were given.  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  He was given radiologic instructions to have it monitored every 2 years to assess for change.   Rolan Bucco, MD 01/26/21 [phone]

## 2021-01-26 NOTE — Discharge Instructions (Signed)
Follow-up with a urologist as discussed.  Return to the emergency room if you have any worsening pain, vomiting, fevers, difficulty urinating or other worsening symptoms.  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  Recommendations are to have repeat imaging every 2 years to assess for change.  This can be followed by her primary care doctor.

## 2021-01-26 NOTE — ED Notes (Signed)
AVS provided to pt and wife, informed of the two Rx sent electronically to the pharmacy listed on the pts EMR. Also provided a urine strainer and spec cup with instructions. Pt also instructed to call [facility] in am to schedule an appt for further evaluation and care of his kidney stone. Opportunity for questions provided prior to DC to home, Pt escorted to POV via wc.

## 2021-01-26 NOTE — ED Notes (Signed)
Presents with rt groin pain, onset earlier today, now radiates to back, has had some nausea as well. Denies fever, denies hx of previous kidney stones. ED MD at bedside, orders have been rec and implemented

## 2021-01-26 NOTE — ED Notes (Signed)
POX DECREASED TO 88% AFTER IV FENTANYL ADMINISTRATION. PT RESPONDS TO LIGHT VERBAL STIMULI IS ABLE TO FOLLOW COMMANDS EASILY TO TAKE DEEP BREATHS. PLACED ON 2LPM VIA [place], POX IMMEDIATELY INCREASED TO 96%, WILL MONITOR AND OBSERVE

## 2021-01-28 ENCOUNTER — Other Ambulatory Visit: Payer: Self-pay | Admitting: Urology

## 2021-01-28 LAB — URINE CULTURE: Culture: NO GROWTH

## 2021-01-29 NOTE — Progress Notes (Addendum)
COVID Vaccine Completed: No Date COVID Vaccine completed:N/A Has received booster:N/A COVID vaccine manufacturer: N/A Date of COVID positive in last 90 days: No  PCP - Cheron Schaumann, MD Cardiologist - N/A  Chest x-ray - N/A EKG - 09/20/20 in epic Stress Test - N/A ECHO - N/A Cardiac Cath - N/A Pacemaker/ICD device last checked: N/A  Sleep Study - N/A CPAP - N/A  Fasting Blood Sugar -  130's Checks Blood Sugar once weekly  Blood Thinner Instructions: N/A Aspirin Instructions: N/A Last Dose: N/A  Activity level:  Can go up a flight of stairs without chest pain but may develop shortness of breath however, activities of daily living without stopping and without symptoms    Anesthesia review: History of stroke, HTN, DM  Patient denies shortness of breath, fever, cough and chest pain at PAT appointment   Patient verbalized understanding of instructions that were given to them at the PAT appointment. Patient was also instructed that they will need to review over the PAT instructions again at home before surgery.

## 2021-01-30 ENCOUNTER — Other Ambulatory Visit: Payer: Self-pay

## 2021-01-30 ENCOUNTER — Encounter: Payer: Medicare Other | Admitting: Physical Therapy

## 2021-01-30 ENCOUNTER — Inpatient Hospital Stay (HOSPITAL_COMMUNITY)
Admission: EM | Admit: 2021-01-30 | Discharge: 2021-02-01 | DRG: 661 | Disposition: A | Discharge status: Home / Self Care | Payer: Medicare Other | Attending: Internal Medicine | Admitting: Internal Medicine

## 2021-01-30 ENCOUNTER — Encounter (HOSPITAL_COMMUNITY): Payer: Self-pay | Admitting: Urology

## 2021-01-30 ENCOUNTER — Other Ambulatory Visit: Payer: Self-pay | Admitting: Urology

## 2021-01-30 DIAGNOSIS — Z888 Allergy status to other drugs, medicaments and biological substances status: Secondary | ICD-10-CM

## 2021-01-30 DIAGNOSIS — Z8673 Personal history of transient ischemic attack (TIA), and cerebral infarction without residual deficits: Secondary | ICD-10-CM

## 2021-01-30 DIAGNOSIS — I10 Essential (primary) hypertension: Secondary | ICD-10-CM | POA: Diagnosis present

## 2021-01-30 DIAGNOSIS — N400 Enlarged prostate without lower urinary tract symptoms: Secondary | ICD-10-CM | POA: Diagnosis present

## 2021-01-30 DIAGNOSIS — E118 Type 2 diabetes mellitus with unspecified complications: Secondary | ICD-10-CM | POA: Diagnosis present

## 2021-01-30 DIAGNOSIS — Z7984 Long term (current) use of oral hypoglycemic drugs: Secondary | ICD-10-CM

## 2021-01-30 DIAGNOSIS — Z20822 Contact with and (suspected) exposure to covid-19: Secondary | ICD-10-CM | POA: Diagnosis present

## 2021-01-30 DIAGNOSIS — N179 Acute kidney failure, unspecified: Secondary | ICD-10-CM | POA: Diagnosis not present

## 2021-01-30 DIAGNOSIS — N132 Hydronephrosis with renal and ureteral calculous obstruction: Secondary | ICD-10-CM | POA: Diagnosis present

## 2021-01-30 DIAGNOSIS — N201 Calculus of ureter: Secondary | ICD-10-CM | POA: Diagnosis present

## 2021-01-30 DIAGNOSIS — E860 Dehydration: Secondary | ICD-10-CM | POA: Diagnosis present

## 2021-01-30 DIAGNOSIS — Z7982 Long term (current) use of aspirin: Secondary | ICD-10-CM

## 2021-01-30 DIAGNOSIS — E785 Hyperlipidemia, unspecified: Secondary | ICD-10-CM | POA: Diagnosis present

## 2021-01-30 DIAGNOSIS — K219 Gastro-esophageal reflux disease without esophagitis: Secondary | ICD-10-CM | POA: Diagnosis present

## 2021-01-30 DIAGNOSIS — E1169 Type 2 diabetes mellitus with other specified complication: Secondary | ICD-10-CM | POA: Diagnosis present

## 2021-01-30 DIAGNOSIS — E1165 Type 2 diabetes mellitus with hyperglycemia: Secondary | ICD-10-CM | POA: Diagnosis not present

## 2021-01-30 DIAGNOSIS — R1111 Vomiting without nausea: Secondary | ICD-10-CM

## 2021-01-30 DIAGNOSIS — E782 Mixed hyperlipidemia: Secondary | ICD-10-CM | POA: Diagnosis present

## 2021-01-30 DIAGNOSIS — Z87891 Personal history of nicotine dependence: Secondary | ICD-10-CM

## 2021-01-30 DIAGNOSIS — Z79899 Other long term (current) drug therapy: Secondary | ICD-10-CM

## 2021-01-30 DIAGNOSIS — N200 Calculus of kidney: Secondary | ICD-10-CM

## 2021-01-30 DIAGNOSIS — R1031 Right lower quadrant pain: Secondary | ICD-10-CM | POA: Diagnosis not present

## 2021-01-30 DIAGNOSIS — E876 Hypokalemia: Secondary | ICD-10-CM | POA: Diagnosis present

## 2021-01-30 MED ORDER — SODIUM CHLORIDE 0.9 % IV BOLUS
1000.0000 mL | Freq: Once | INTRAVENOUS | Status: AC
  Administered 2021-01-31: 1000 mL via INTRAVENOUS

## 2021-01-30 MED ORDER — ONDANSETRON HCL 4 MG/2ML IJ SOLN
4.0000 mg | Freq: Once | INTRAMUSCULAR | Status: AC
  Administered 2021-01-31: 4 mg via INTRAVENOUS
  Filled 2021-01-30 (×2): qty 2

## 2021-01-30 MED ORDER — MORPHINE SULFATE (PF) 4 MG/ML IV SOLN
4.0000 mg | Freq: Once | INTRAVENOUS | Status: AC
Start: 2021-01-30 — End: 2021-01-31
  Administered 2021-01-31: 4 mg via INTRAVENOUS
  Filled 2021-01-30 (×2): qty 1

## 2021-01-30 MED ORDER — SODIUM CHLORIDE 0.9 % IV SOLN
1.0000 g | INTRAVENOUS | Status: AC
  Administered 2021-01-31: 2 g via INTRAVENOUS
  Filled 2021-01-30: qty 1

## 2021-01-30 NOTE — ED Provider Notes (Signed)
Emergency Medicine Provider Triage Evaluation Note  ELENA COTHERN , a 75 y.o. male  was evaluated in triage.  Pt complains of abd pain.  Review of Systems  Positive: R flank pain, abd pain, nausea Negative: Fever, dysuria, cp, sob  Physical Exam  BP 113/65 (BP Location: Right Arm)   Pulse 93   Temp 98.5 F (36.9 C) (Oral)   Resp 16   SpO2 95%  Gen:   Awake, no distress   Resp:  Normal effort  MSK:   Moves extremities without difficulty  Other:    Medical Decision Making  Medically screening exam initiated at 11:14 PM.  Appropriate orders placed.  Wonda Olds was informed that the remainder of the evaluation will be completed by another provider, this initial triage assessment does not replace that evaluation, and the importance of remaining in the ED until their evaluation is complete.  Patient report he has right-sided kidney stone who is scheduled to have a cystoscopy with right retrograde ureteroscopy and stent placement by Dr. Berneice Heinrich tomorrow.  He is here due to feeling dehydrated, having pain, and have not been eating much for the past 5 days.   Fayrene Helper, PA-C 01/30/21 2328    Horton, Clabe Seal, DO 01/30/21 2330

## 2021-01-30 NOTE — Anesthesia Preprocedure Evaluation (Addendum)
Anesthesia Evaluation  Patient identified by MRN, date of birth, ID band Patient awake    Reviewed: Allergy & Precautions, NPO status , Patient's Chart, lab work & pertinent test results  History of Anesthesia Complications (+) PONV  Airway Mallampati: III  TM Distance: >3 FB Neck ROM: Full    Dental  (+) Missing, Chipped, Dental Advisory Given,    Pulmonary former smoker,    Pulmonary exam normal breath sounds clear to auscultation       Cardiovascular hypertension, Pt. on medications Normal cardiovascular exam Rhythm:Regular Rate:Normal     Neuro/Psych CVA (issues with balance), Residual Symptoms    GI/Hepatic Neg liver ROS, GERD  Medicated and Controlled,  Endo/Other  diabetes, Well Controlled, Type 2, Oral Hypoglycemic Agents  Renal/GU Renal InsufficiencyRenal diseaseCr 2.48, K 3.9     Musculoskeletal   Abdominal   Peds  Hematology   Anesthesia Other Findings   Reproductive/Obstetrics                          Anesthesia Physical Anesthesia Plan  ASA: III  Anesthesia Plan: General   Post-op Pain Management:    Induction: Intravenous  PONV Risk Score and Plan: 3 and Ondansetron, Dexamethasone and Treatment may vary due to age or medical condition  Airway Management Planned: LMA  Additional Equipment:   Intra-op Plan:   Post-operative Plan: Extubation in OR  Informed Consent: I have reviewed the patients History and Physical, chart, labs and discussed the procedure including the risks, benefits and alternatives for the proposed anesthesia with the patient or authorized representative who has indicated his/her understanding and acceptance.     Dental advisory given  Plan Discussed with: CRNA  Anesthesia Plan Comments:         Anesthesia Quick Evaluation

## 2021-01-30 NOTE — ED Triage Notes (Signed)
Abdominal pain and n/v since Sunday.  Denies fevers.

## 2021-01-31 ENCOUNTER — Observation Stay (HOSPITAL_COMMUNITY): Payer: Medicare Other | Admitting: Physician Assistant

## 2021-01-31 ENCOUNTER — Encounter (HOSPITAL_COMMUNITY): Payer: Self-pay | Admitting: Internal Medicine

## 2021-01-31 ENCOUNTER — Ambulatory Visit (HOSPITAL_COMMUNITY): Admission: RE | Admit: 2021-01-31 | Payer: Medicare Other | Source: Ambulatory Visit | Admitting: Urology

## 2021-01-31 ENCOUNTER — Inpatient Hospital Stay (HOSPITAL_COMMUNITY): Payer: Medicare Other

## 2021-01-31 ENCOUNTER — Observation Stay (HOSPITAL_COMMUNITY): Payer: Medicare Other

## 2021-01-31 ENCOUNTER — Encounter (HOSPITAL_COMMUNITY)
Admission: EM | Disposition: A | Discharge status: Home / Self Care | Payer: Self-pay | Source: Home / Self Care | Attending: Internal Medicine

## 2021-01-31 DIAGNOSIS — R1031 Right lower quadrant pain: Secondary | ICD-10-CM | POA: Diagnosis present

## 2021-01-31 DIAGNOSIS — N400 Enlarged prostate without lower urinary tract symptoms: Secondary | ICD-10-CM | POA: Diagnosis present

## 2021-01-31 DIAGNOSIS — N201 Calculus of ureter: Secondary | ICD-10-CM | POA: Diagnosis present

## 2021-01-31 DIAGNOSIS — Z8673 Personal history of transient ischemic attack (TIA), and cerebral infarction without residual deficits: Secondary | ICD-10-CM | POA: Diagnosis not present

## 2021-01-31 DIAGNOSIS — E860 Dehydration: Secondary | ICD-10-CM | POA: Diagnosis present

## 2021-01-31 DIAGNOSIS — Z7984 Long term (current) use of oral hypoglycemic drugs: Secondary | ICD-10-CM | POA: Diagnosis not present

## 2021-01-31 DIAGNOSIS — Z7982 Long term (current) use of aspirin: Secondary | ICD-10-CM | POA: Diagnosis not present

## 2021-01-31 DIAGNOSIS — E1169 Type 2 diabetes mellitus with other specified complication: Secondary | ICD-10-CM | POA: Diagnosis present

## 2021-01-31 DIAGNOSIS — Z79899 Other long term (current) drug therapy: Secondary | ICD-10-CM | POA: Diagnosis not present

## 2021-01-31 DIAGNOSIS — E876 Hypokalemia: Secondary | ICD-10-CM | POA: Diagnosis present

## 2021-01-31 DIAGNOSIS — E785 Hyperlipidemia, unspecified: Secondary | ICD-10-CM | POA: Diagnosis present

## 2021-01-31 DIAGNOSIS — N179 Acute kidney failure, unspecified: Secondary | ICD-10-CM | POA: Diagnosis present

## 2021-01-31 DIAGNOSIS — I10 Essential (primary) hypertension: Secondary | ICD-10-CM | POA: Diagnosis present

## 2021-01-31 DIAGNOSIS — E782 Mixed hyperlipidemia: Secondary | ICD-10-CM

## 2021-01-31 DIAGNOSIS — K219 Gastro-esophageal reflux disease without esophagitis: Secondary | ICD-10-CM | POA: Diagnosis present

## 2021-01-31 DIAGNOSIS — Z20822 Contact with and (suspected) exposure to covid-19: Secondary | ICD-10-CM | POA: Diagnosis present

## 2021-01-31 DIAGNOSIS — E118 Type 2 diabetes mellitus with unspecified complications: Secondary | ICD-10-CM | POA: Diagnosis not present

## 2021-01-31 DIAGNOSIS — Z87891 Personal history of nicotine dependence: Secondary | ICD-10-CM | POA: Diagnosis not present

## 2021-01-31 DIAGNOSIS — E1165 Type 2 diabetes mellitus with hyperglycemia: Secondary | ICD-10-CM | POA: Diagnosis not present

## 2021-01-31 DIAGNOSIS — N132 Hydronephrosis with renal and ureteral calculous obstruction: Secondary | ICD-10-CM | POA: Diagnosis present

## 2021-01-31 DIAGNOSIS — Z888 Allergy status to other drugs, medicaments and biological substances status: Secondary | ICD-10-CM | POA: Diagnosis not present

## 2021-01-31 HISTORY — DX: Personal history of urinary calculi: Z87.442

## 2021-01-31 HISTORY — DX: Nausea with vomiting, unspecified: R11.2

## 2021-01-31 HISTORY — DX: Other specified postprocedural states: Z98.890

## 2021-01-31 HISTORY — DX: History of falling: Z91.81

## 2021-01-31 HISTORY — PX: CYSTOSCOPY WITH RETROGRADE PYELOGRAM, URETEROSCOPY AND STENT PLACEMENT: SHX5789

## 2021-01-31 HISTORY — DX: Other abnormalities of gait and mobility: R26.89

## 2021-01-31 LAB — COMPREHENSIVE METABOLIC PANEL
ALT: 21 U/L (ref 0–44)
AST: 16 U/L (ref 15–41)
Albumin: 3.9 g/dL (ref 3.5–5.0)
Alkaline Phosphatase: 62 U/L (ref 38–126)
Anion gap: 12 (ref 5–15)
BUN: 46 mg/dL — ABNORMAL HIGH (ref 8–23)
CO2: 24 mmol/L (ref 22–32)
Calcium: 10.2 mg/dL (ref 8.9–10.3)
Chloride: 98 mmol/L (ref 98–111)
Creatinine, Ser: 2.48 mg/dL — ABNORMAL HIGH (ref 0.61–1.24)
GFR, Estimated: 26 mL/min — ABNORMAL LOW (ref >60)
Glucose, Bld: 213 mg/dL — ABNORMAL HIGH (ref 70–99)
Potassium: 3.9 mmol/L (ref 3.5–5.1)
Sodium: 134 mmol/L — ABNORMAL LOW (ref 135–145)
Total Bilirubin: 1.1 mg/dL (ref 0.3–1.2)
Total Protein: 7.5 g/dL (ref 6.5–8.1)

## 2021-01-31 LAB — HEMOGLOBIN A1C
Hgb A1c MFr Bld: 8.6 % — ABNORMAL HIGH (ref 4.8–5.6)
Mean Plasma Glucose: 200.12 mg/dL

## 2021-01-31 LAB — URINALYSIS, ROUTINE W REFLEX MICROSCOPIC
Bilirubin Urine: NEGATIVE
Glucose, UA: >=500 mg/dL — AB
Ketones, ur: 5 mg/dL — AB
Leukocytes,Ua: NEGATIVE
Nitrite: NEGATIVE
Protein, ur: NEGATIVE mg/dL
Specific Gravity, Urine: 1.017 (ref 1.005–1.030)
pH: 5 (ref 5.0–8.0)

## 2021-01-31 LAB — LIPASE, BLOOD: Lipase: 47 U/L (ref 11–51)

## 2021-01-31 LAB — CBC WITH DIFFERENTIAL/PLATELET
Abs Immature Granulocytes: 0.05 10*3/uL (ref 0.00–0.07)
Basophils Absolute: 0 10*3/uL (ref 0.0–0.1)
Basophils Relative: 0 %
Eosinophils Absolute: 0 10*3/uL (ref 0.0–0.5)
Eosinophils Relative: 0 %
HCT: 43.5 % (ref 39.0–52.0)
Hemoglobin: 14.5 g/dL (ref 13.0–17.0)
Immature Granulocytes: 0 %
Lymphocytes Relative: 4 %
Lymphs Abs: 0.5 10*3/uL — ABNORMAL LOW (ref 0.7–4.0)
MCH: 28 pg (ref 26.0–34.0)
MCHC: 33.3 g/dL (ref 30.0–36.0)
MCV: 84.1 fL (ref 80.0–100.0)
Monocytes Absolute: 0.9 10*3/uL (ref 0.1–1.0)
Monocytes Relative: 7 %
Neutro Abs: 10.8 10*3/uL — ABNORMAL HIGH (ref 1.7–7.7)
Neutrophils Relative %: 89 %
Platelets: 186 10*3/uL (ref 150–400)
RBC: 5.17 MIL/uL (ref 4.22–5.81)
RDW: 16.1 % — ABNORMAL HIGH (ref 11.5–15.5)
WBC: 12.3 10*3/uL — ABNORMAL HIGH (ref 4.0–10.5)
nRBC: 0 % (ref 0.0–0.2)

## 2021-01-31 LAB — GLUCOSE, CAPILLARY
Glucose-Capillary: 180 mg/dL — ABNORMAL HIGH (ref 70–99)
Glucose-Capillary: 124 mg/dL — ABNORMAL HIGH (ref 70–99)
Glucose-Capillary: 112 mg/dL — ABNORMAL HIGH (ref 70–99)
Glucose-Capillary: 176 mg/dL — ABNORMAL HIGH (ref 70–99)
Glucose-Capillary: 220 mg/dL — ABNORMAL HIGH (ref 70–99)

## 2021-01-31 LAB — RESP PANEL BY RT-PCR (FLU A&B, COVID) ARPGX2
Influenza A by PCR: NEGATIVE
Influenza B by PCR: NEGATIVE
SARS Coronavirus 2 by RT PCR: NEGATIVE

## 2021-01-31 SURGERY — CYSTOURETEROSCOPY, WITH RETROGRADE PYELOGRAM AND STENT INSERTION
Anesthesia: General | Laterality: Right

## 2021-01-31 MED ORDER — LIDOCAINE 2% (20 MG/ML) 5 ML SYRINGE
INTRAMUSCULAR | Status: DC | PRN
  Administered 2021-01-31: 60 mg via INTRAVENOUS

## 2021-01-31 MED ORDER — FENTANYL CITRATE (PF) 100 MCG/2ML IJ SOLN
INTRAMUSCULAR | Status: AC
  Filled 2021-01-31: qty 2

## 2021-01-31 MED ORDER — MORPHINE SULFATE (PF) 2 MG/ML IV SOLN
2.0000 mg | Freq: Once | INTRAVENOUS | Status: AC
  Administered 2021-01-31: 2 mg via INTRAVENOUS
  Filled 2021-01-31: qty 1

## 2021-01-31 MED ORDER — SODIUM CHLORIDE 0.9 % IR SOLN
Status: DC | PRN
  Administered 2021-01-31: 1000 mL

## 2021-01-31 MED ORDER — DEXAMETHASONE SODIUM PHOSPHATE 10 MG/ML IJ SOLN
INTRAMUSCULAR | Status: AC
  Filled 2021-01-31: qty 1

## 2021-01-31 MED ORDER — ONDANSETRON HCL 4 MG/2ML IJ SOLN
INTRAMUSCULAR | Status: DC | PRN
  Administered 2021-01-31: 4 mg via INTRAVENOUS

## 2021-01-31 MED ORDER — ONDANSETRON HCL 4 MG/2ML IJ SOLN: 4.0000 mg | Freq: Four times a day (QID) | INTRAMUSCULAR | Status: DC | PRN

## 2021-01-31 MED ORDER — TAMSULOSIN HCL 0.4 MG PO CAPS
0.4000 mg | ORAL_CAPSULE | Freq: Every day | ORAL | Status: DC
  Filled 2021-01-31 (×2): qty 1

## 2021-01-31 MED ORDER — ATORVASTATIN CALCIUM 40 MG PO TABS
40.0000 mg | ORAL_TABLET | Freq: Every day | ORAL | Status: DC
  Filled 2021-01-31: qty 1

## 2021-01-31 MED ORDER — EPHEDRINE 5 MG/ML INJ
INTRAVENOUS | Status: AC
  Filled 2021-01-31: qty 10

## 2021-01-31 MED ORDER — ONDANSETRON HCL 4 MG/2ML IJ SOLN
INTRAMUSCULAR | Status: AC
  Filled 2021-01-31: qty 2

## 2021-01-31 MED ORDER — PROPOFOL 10 MG/ML IV BOLUS
INTRAVENOUS | Status: DC | PRN
  Administered 2021-01-31: 80 mg via INTRAVENOUS

## 2021-01-31 MED ORDER — LACTATED RINGERS IV SOLN: INTRAVENOUS | Status: AC

## 2021-01-31 MED ORDER — POLYETHYLENE GLYCOL 3350 17 G PO PACK: 17.0000 g | PACK | Freq: Every day | ORAL | Status: DC | PRN

## 2021-01-31 MED ORDER — DEXAMETHASONE SODIUM PHOSPHATE 4 MG/ML IJ SOLN
INTRAMUSCULAR | Status: DC | PRN
  Administered 2021-01-31: 5 mg via INTRAVENOUS

## 2021-01-31 MED ORDER — ORAL CARE MOUTH RINSE: 15.0000 mL | Freq: Once | OROMUCOSAL | Status: AC

## 2021-01-31 MED ORDER — ONDANSETRON HCL 4 MG PO TABS: 4.0000 mg | ORAL_TABLET | Freq: Four times a day (QID) | ORAL | Status: DC | PRN

## 2021-01-31 MED ORDER — SODIUM CHLORIDE 0.9 % IV SOLN
INTRAVENOUS | Status: AC
  Filled 2021-01-31: qty 20

## 2021-01-31 MED ORDER — ACETAMINOPHEN 500 MG PO TABS: 1000.0000 mg | ORAL_TABLET | Freq: Once | ORAL | Status: DC

## 2021-01-31 MED ORDER — LACTATED RINGERS IV BOLUS
1000.0000 mL | Freq: Once | INTRAVENOUS | Status: AC
  Administered 2021-01-31: 1000 mL via INTRAVENOUS

## 2021-01-31 MED ORDER — MORPHINE SULFATE (PF) 2 MG/ML IV SOLN: 2.0000 mg | INTRAVENOUS | Status: DC | PRN

## 2021-01-31 MED ORDER — PHENYLEPHRINE 40 MCG/ML (10ML) SYRINGE FOR IV PUSH (FOR BLOOD PRESSURE SUPPORT)
PREFILLED_SYRINGE | INTRAVENOUS | Status: DC | PRN
  Administered 2021-01-31: 200 ug via INTRAVENOUS

## 2021-01-31 MED ORDER — IOHEXOL 300 MG/ML  SOLN
INTRAMUSCULAR | Status: DC | PRN
  Administered 2021-01-31: 10 mL

## 2021-01-31 MED ORDER — ONDANSETRON HCL 4 MG/2ML IJ SOLN
4.0000 mg | Freq: Once | INTRAMUSCULAR | Status: AC
  Administered 2021-01-31: 4 mg via INTRAVENOUS
  Filled 2021-01-31: qty 2

## 2021-01-31 MED ORDER — PROPOFOL 500 MG/50ML IV EMUL
INTRAVENOUS | Status: AC
  Filled 2021-01-31: qty 50

## 2021-01-31 MED ORDER — MORPHINE SULFATE (PF) 4 MG/ML IV SOLN: 4.0000 mg | INTRAVENOUS | Status: DC | PRN

## 2021-01-31 MED ORDER — AMLODIPINE BESYLATE 10 MG PO TABS
10.0000 mg | ORAL_TABLET | Freq: Every morning | ORAL | Status: DC
  Filled 2021-01-31: qty 1

## 2021-01-31 MED ORDER — SODIUM CHLORIDE 0.9 % IV SOLN: 2.0000 g | Freq: Once | INTRAVENOUS | Status: DC

## 2021-01-31 MED ORDER — LACTATED RINGERS IV SOLN: INTRAVENOUS | Status: DC

## 2021-01-31 MED ORDER — FENTANYL CITRATE (PF) 100 MCG/2ML IJ SOLN: 25.0000 ug | INTRAMUSCULAR | Status: DC | PRN

## 2021-01-31 MED ORDER — INSULIN ASPART 100 UNIT/ML IJ SOLN
0.0000 [IU] | Freq: Three times a day (TID) | INTRAMUSCULAR | Status: DC
  Administered 2021-01-31: 3 [IU] via SUBCUTANEOUS
  Administered 2021-01-31: 5 [IU] via SUBCUTANEOUS
  Administered 2021-01-31: 3 [IU] via SUBCUTANEOUS

## 2021-01-31 MED ORDER — ACETAMINOPHEN 325 MG PO TABS: 650.0000 mg | ORAL_TABLET | Freq: Four times a day (QID) | ORAL | Status: DC | PRN

## 2021-01-31 MED ORDER — ASPIRIN EC 81 MG PO TBEC
81.0000 mg | DELAYED_RELEASE_TABLET | Freq: Every day | ORAL | Status: DC
  Filled 2021-01-31 (×2): qty 1

## 2021-01-31 MED ORDER — FENTANYL CITRATE (PF) 100 MCG/2ML IJ SOLN
INTRAMUSCULAR | Status: DC | PRN
  Administered 2021-01-31 (×2): 50 ug via INTRAVENOUS

## 2021-01-31 MED ORDER — EPHEDRINE SULFATE-NACL 50-0.9 MG/10ML-% IV SOSY
PREFILLED_SYRINGE | INTRAVENOUS | Status: DC | PRN
  Administered 2021-01-31: 7.5 mg via INTRAVENOUS

## 2021-01-31 MED ORDER — CHLORHEXIDINE GLUCONATE 0.12 % MT SOLN
15.0000 mL | Freq: Once | OROMUCOSAL | Status: AC
  Administered 2021-01-31: 15 mL via OROMUCOSAL

## 2021-01-31 MED ORDER — POLYVINYL ALCOHOL 1.4 % OP SOLN
1.0000 [drp] | Freq: Every day | OPHTHALMIC | Status: DC | PRN
  Filled 2021-01-31: qty 15

## 2021-01-31 MED ORDER — ACETAMINOPHEN 650 MG RE SUPP: 650.0000 mg | Freq: Four times a day (QID) | RECTAL | Status: DC | PRN

## 2021-01-31 SURGICAL SUPPLY — 21 items
BAG URO CATCHER STRL LF (MISCELLANEOUS) ×2 IMPLANT
BASKET LASER NITINOL 1.9FR (BASKET) ×4 IMPLANT
CATH INTERMIT  6FR 70CM (CATHETERS) ×2 IMPLANT
CLOTH BEACON ORANGE TIMEOUT ST (SAFETY) ×2 IMPLANT
EXTRACTOR STONE 1.7FRX115CM (UROLOGICAL SUPPLIES) IMPLANT
GLOVE SURG ENC TEXT LTX SZ7.5 (GLOVE) ×2 IMPLANT
GOWN STRL REUS W/TWL LRG LVL3 (GOWN DISPOSABLE) ×2 IMPLANT
GUIDEWIRE ANG ZIPWIRE 038X150 (WIRE) ×2 IMPLANT
GUIDEWIRE STR DUAL SENSOR (WIRE) ×2 IMPLANT
KIT TURNOVER KIT A (KITS) ×2 IMPLANT
LASER FIB FLEXIVA PULSE ID 365 (Laser) IMPLANT
MANIFOLD NEPTUNE II (INSTRUMENTS) ×2 IMPLANT
PACK CYSTO (CUSTOM PROCEDURE TRAY) ×2 IMPLANT
SHEATH URETERAL 12FRX28CM (UROLOGICAL SUPPLIES) IMPLANT
SHEATH URETERAL 12FRX35CM (MISCELLANEOUS) IMPLANT
STENT POLARIS 5FRX26 (STENTS) ×2 IMPLANT
TRACTIP FLEXIVA PULS ID 200XHI (Laser) ×1 IMPLANT
TRACTIP FLEXIVA PULSE ID 200 (Laser) ×2
TUBE FEEDING 8FR 16IN STR KANG (MISCELLANEOUS) ×2 IMPLANT
TUBING CONNECTING 10 (TUBING) ×2 IMPLANT
TUBING UROLOGY SET (TUBING) IMPLANT

## 2021-01-31 NOTE — ED Notes (Signed)
ED TO INPATIENT HANDOFF REPORT  Name/Age/Gender [name] Soto 75 y.o. male  Code Status Code Status History    Date Active Date Inactive Code Status Order ID Comments User Context   09/26/2020 1822 09/27/2020 1633 Full Code [id number]  Yevonne Pax Inpatient   05/07/2016 2016 05/08/2016 1721 Full Code [id number]  Ralene Bathe, PA-C Inpatient   Advance Care Planning Activity    Questions for Most Recent Historical Code Status (Order [id number])       Home/SNF/Other Home  Chief Complaint Acute kidney injury (HCC) [N17.9]  Level of Care/Admitting Diagnosis ED Disposition    ED Disposition Condition Comment   Admit  Hospital Area: [facility] [[id number]]  Level of Care: Telemetry [5]  Admit to tele based on following criteria: Monitor for Ischemic changes  Covid Evaluation: Asymptomatic Screening Protocol (No Symptoms)  Diagnosis: Acute kidney injury [facility]) [[id number]]  Admitting Physician: Marinda Elk [[id number]]  Attending Physician: Marinda Elk [[id number]]       Medical History Past Medical History:  Diagnosis Date  . At risk for falls   . Balance problem   . Cerebral thrombosis with cerebral infarction (HCC)   . Diabetes mellitus without complication (HCC)   . GERD (gastroesophageal reflux disease)   . History of kidney stones   . Humerus fracture    PROXIMAL RIGHT  . Hypertension   . Nausea and vomiting   . Pancreatic lesion   . PONV (postoperative nausea and vomiting)   . Stroke [facility]) 2012    Allergies Allergies  Allergen Reactions  . Metformin Nausea And Vomiting    METFORMIN ER only, not immediate release    IV Location/Drains/Wounds Patient Lines/Drains/Airways Status    Active Line/Drains/Airways    Name Placement date Placement time Site Days   Peripheral IV 09/26/20 Left;Posterior Hand 09/26/20  1047  Hand  127   Peripheral IV 01/31/21 Right Hand 01/31/21  0121  Hand  less than 1   Incision (Closed) 05/07/16  Shoulder Right 05/07/16  1813  -- 1730   Incision (Closed) 09/26/20 Shoulder Right 09/26/20  1511  -- 127          Labs/Imaging Results for orders placed or performed during the hospital encounter of 01/30/21 (from the past 48 hour(s))  CBC with Differential     Status: Abnormal   Collection Time: 01/31/21 12:15 AM  Result Value Ref Range   WBC 12.3 (H) 4.0 - 10.5 K/uL   RBC 5.17 4.22 - 5.81 MIL/uL   Hemoglobin 14.5 13.0 - 17.0 g/dL   HCT 36.6 29.4 - 76.5 %   MCV 84.1 80.0 - 100.0 fL   MCH 28.0 26.0 - 34.0 pg   MCHC 33.3 30.0 - 36.0 g/dL   RDW 46.5 (H) 03.5 - 46.5 %   Platelets 186 150 - 400 K/uL   nRBC 0.0 0.0 - 0.2 %   Neutrophils Relative % 89 %   Neutro Abs 10.8 (H) 1.7 - 7.7 K/uL   Lymphocytes Relative 4 %   Lymphs Abs 0.5 (L) 0.7 - 4.0 K/uL   Monocytes Relative 7 %   Monocytes Absolute 0.9 0.1 - 1.0 K/uL   Eosinophils Relative 0 %   Eosinophils Absolute 0.0 0.0 - 0.5 K/uL   Basophils Relative 0 %   Basophils Absolute 0.0 0.0 - 0.1 K/uL   Immature Granulocytes 0 %   Abs Immature Granulocytes 0.05 0.00 - 0.07 K/uL    Comment: Performed at [facility], [address] [place], [place] [address]  Comprehensive metabolic panel     Status: Abnormal   Collection Time: 01/31/21 12:15 AM  Result Value Ref Range   Sodium 134 (L) 135 - 145 mmol/L   Potassium 3.9 3.5 - 5.1 mmol/L   Chloride 98 98 - 111 mmol/L   CO2 24 22 - 32 mmol/L   Glucose, Bld 213 (H) 70 - 99 mg/dL    Comment: Glucose reference range applies only to samples taken after fasting for at least 8 hours.   BUN 46 (H) 8 - 23 mg/dL   Creatinine, Ser 2.99 (H) 0.61 - 1.24 mg/dL   Calcium 24.2 8.9 - 68.3 mg/dL   Total Protein 7.5 6.5 - 8.1 g/dL   Albumin 3.9 3.5 - 5.0 g/dL   AST 16 15 - 41 U/L   ALT 21 0 - 44 U/L   Alkaline Phosphatase 62 38 - 126 U/L   Total Bilirubin 1.1 0.3 - 1.2 mg/dL   GFR, Estimated 26 (L) >60 mL/min    Comment: (NOTE) Calculated using the CKD-EPI Creatinine  Equation (2021)    Anion gap 12 5 - 15    Comment: Performed at [facility], [address] [place], [place] [address]  Lipase, blood     Status: None   Collection Time: 01/31/21 12:15 AM  Result Value Ref Range   Lipase 47 11 - 51 U/L    Comment: Performed at [facility], [address], [place], [place] [address]  Resp Panel by RT-PCR (Flu A&B, Covid) Nasopharyngeal Swab     Status: None   Collection Time: 01/31/21  3:29 AM   Specimen: Nasopharyngeal Swab; Nasopharyngeal(NP) swabs in vial transport medium  Result Value Ref Range   SARS Coronavirus 2 by RT PCR NEGATIVE NEGATIVE    Comment: (NOTE) SARS-CoV-2 target nucleic acids are NOT DETECTED.  The SARS-CoV-2 RNA is generally detectable in upper respiratory specimens during the acute phase of infection. The lowest concentration of SARS-CoV-2 viral copies this assay can detect is 138 copies/mL. A negative result does not preclude SARS-Cov-2 infection and should not be used as the sole basis for treatment or other patient management decisions. A negative result may occur with  improper specimen collection/handling, submission of specimen other than nasopharyngeal swab, presence of viral mutation(s) within the areas targeted by this assay, and inadequate number of viral copies(<138 copies/mL). A negative result must be combined with clinical observations, patient history, and epidemiological information. The expected result is Negative.  Fact Sheet for Patients:  BloggerCourse.com  Fact Sheet for Healthcare Providers:  SeriousBroker.it  This test is no t yet approved or cleared by the [place] FDA and  has been authorized for detection and/or diagnosis of SARS-CoV-2 by FDA under an Emergency Use Authorization (EUA). This EUA will remain  in effect (meaning this test can be used) for the duration of the COVID-19 declaration under  Section 564(b)(1) of the Act, 21 U.S.C.section 360bbb-3(b)(1), unless the authorization is terminated  or revoked sooner.       Influenza A by PCR NEGATIVE NEGATIVE   Influenza B by PCR NEGATIVE NEGATIVE    Comment: (NOTE) The Xpert Xpress SARS-CoV-2/FLU/RSV plus assay is intended as an aid in the diagnosis of influenza from Nasopharyngeal swab specimens and should not be used as a sole basis for treatment. Nasal washings and aspirates are unacceptable for Xpert Xpress SARS-CoV-2/FLU/RSV testing.  Fact Sheet for Patients: BloggerCourse.com  Fact Sheet for Healthcare Providers: SeriousBroker.it  This test is not yet approved or cleared by the [place] and has been authorized for detection and/or diagnosis of SARS-CoV-2 by FDA under an Emergency Use Authorization (EUA). This EUA will remain in effect (meaning this test can be used) for the duration of the COVID-19 declaration under Section 564(b)(1) of the Act, 21 U.S.C. section 360bbb-3(b)(1), unless the authorization is terminated or revoked.  Performed at [facility], [address], [place], [place] [address]    No results found.  Pending Labs Unresulted Labs (From admission, onward)          Start     Ordered   01/30/21 2315  Urinalysis, Routine w reflex microscopic Urine, Clean Catch  (ED Abdominal Pain)  ONCE - STAT,   STAT        01/30/21 2314          Vitals/Pain Today's Vitals   01/31/21 0400 01/31/21 0415 01/31/21 0430 01/31/21 0445  BP: (!) 158/84 (!) 156/83 (!) 158/86 (!) 149/81  Pulse: (!) 102 98 100 98  Resp: 18 (!) 23 17 16   Temp:      TempSrc:      SpO2: 93% 94% 90% 90%  PainSc:        Isolation Precautions Airborne and Contact precautions  Medications Medications  morphine 4 MG/ML injection 4 mg (4 mg Intravenous Given 01/31/21 0131)  ondansetron (ZOFRAN) injection 4 mg (4 mg Intravenous Given 01/31/21 0126)   sodium chloride 0.9 % bolus 1,000 mL (0 mLs Intravenous Stopped 01/31/21 0247)  morphine 2 MG/ML injection 2 mg (2 mg Intravenous Given 01/31/21 0248)  ondansetron (ZOFRAN) injection 4 mg (4 mg Intravenous Given 01/31/21 0248)  lactated ringers bolus 1,000 mL (1,000 mLs Intravenous New Bag/Given 01/31/21 0247)    Mobility walks

## 2021-01-31 NOTE — ED Notes (Signed)
Attempted IV insertion, unsuccessful attempt x1.

## 2021-01-31 NOTE — Discharge Instructions (Signed)
1 - You may have urinary urgency (bladder spasms) and bloody urine on / off with stent in place. This is normal. ° °2 - Call MD or go to ER for fever >102, severe pain / nausea / vomiting not relieved by medications, or acute change in medical status ° °

## 2021-01-31 NOTE — ED Notes (Signed)
Pt aware of need for urine sample.  

## 2021-01-31 NOTE — Brief Op Note (Signed)
01/30/2021 - 01/31/2021  1:26 PM  PATIENT:  [name] Soto  75 y.o. male  PRE-OPERATIVE DIAGNOSIS:  RIGHT URETERAL STONE  POST-OPERATIVE DIAGNOSIS:  RIGHT URETERAL STONE  PROCEDURE:  Procedure(s): CYSTOSCOPY WITH RIGHT  RETROGRADE URETEROSCOPY HOLMIUM LASER AND STENT PLACEMENT (Right)  SURGEON:  Surgeon(s) and Role:    * [name], MD - Primary  PHYSICIAN ASSISTANT:   ASSISTANTS: none   ANESTHESIA:   general  EBL:  0 mL   BLOOD ADMINISTERED:none  DRAINS: none   LOCAL MEDICATIONS USED:  NONE  SPECIMEN:  Source of Specimen:  Rt ureteral stone fragments  DISPOSITION OF SPECIMEN:  [facility] for compositional analysis  COUNTS:  YES  TOURNIQUET:  * No tourniquets in log *  DICTATION: .Other Dictation: Dictation Number [id number]  PLAN OF CARE: Admit to inpatient   PATIENT DISPOSITION:  PACU - hemodynamically stable.   Delay start of Pharmacological VTE agent (>24hrs) due to surgical blood loss or risk of bleeding: yes

## 2021-01-31 NOTE — Anesthesia Procedure Notes (Signed)
Procedure Name: LMA Insertion Date/Time: 01/31/2021 12:38 PM Performed by: Vanessa Malvern, CRNA Pre-anesthesia Checklist: Emergency Drugs available, Patient identified, Suction available and Patient being monitored Patient Re-evaluated:Patient Re-evaluated prior to induction Oxygen Delivery Method: Circle system utilized Preoxygenation: Pre-oxygenation with 100% oxygen Induction Type: IV induction Ventilation: Mask ventilation without difficulty LMA: LMA inserted LMA Size: 4.0 Number of attempts: 1 Placement Confirmation: positive ETCO2 and breath sounds checked- equal and bilateral Tube secured with: Tape Dental Injury: Teeth and Oropharynx as per pre-operative assessment

## 2021-01-31 NOTE — OR Nursing (Signed)
Stone taken by Dr. Manny 

## 2021-01-31 NOTE — Progress Notes (Signed)
PROGRESS NOTE    [name] Soto  GLO:[id number] DOB: June 12, 1946 DOA: 01/30/2021 PCP: Cheron Schaumann., MD    Brief Narrative:  [name] Soto was admitted to the hospital with working diagnosis of acute kidney injury in the setting of right ureteral stone.  75 year old male past medical history for type 2 diabetes mellitus, GERD, CVA 2013, hypertension, dyslipidemia who presented with abdominal pain, flank pain, nausea and vomiting.  Patient was diagnosed with a 7 mm right ureteral stone 5/15 in the ED, instructions to follow-up with urology.  At home patient had recurrent abdominal pain, more severe in intensity, sharp pain nature, associated with nausea and vomiting.  He was scheduled to have urology procedure but due to severe symptoms he came to the ED instead.  On his initial physical examination blood pressure 158/86, heart rate 100, respirate 17, oxygen saturation 90-99%, patient had dry mucous membranes, his lungs were clear to auscultation bilaterally, heart S1-S2, present, rhythmic, abdomen had periumbilical tenderness, soft, no guarding, no lower extremity edema.  Sodium 134, potassium 3.9, chloride 98, bicarb 24, glucose 213, BUN 46, creatinine 2.48, white count 12.3, hemoglobin 14.5, hematocrit 43.5, platelets 186. SARS COVID-19 negative.  Urinalysis specific gravity 1.017, 21-50 red cells, 0-5 white cells, negative protein,> 500 glucose.  Patient was placed on IV fluids and as needed IV analgesics.  5/20 underwent cystoscopy with right retrograde ureteroscopy, holmium laser and right stent placement.    Assessment & Plan:   Principal Problem:   Acute kidney injury (HCC) Active Problems:   Right ureteral stone   Essential hypertension   GERD without esophagitis   Type 2 diabetes mellitus with complication, without long-term current use of insulin (HCC)   Mixed diabetic hyperlipidemia associated with type 2 diabetes mellitus (HCC)   Acute renal failure (ARF) (HCC)   1.  AKI continue supportive medical care with IV fluids, balanced electrolyte solutions with LR, will decrease rate to 75 ml per hr and will follow up on renal panel in am. Avoid hypotension and nephrotoxic medications.  CT from 05/15 with mild right hydroureteronephrosis.   Further work up with renal US, follow up on hydronephrosis now that stent has been placed and obtain renal dimensions that may suggest CKD.   2. Urolithiasis, 7 mm calculus in the distal right ureter proximal to the UVJ. Now sp cystoscopy and retrograde ureteroscopy with right stent placement.  Continue as needed analgesics and follow with post operative recommendations per Urology.  Out of bed to chair tid with meals.   3. HTN blood pressure 165/82, continue blood pressure control with amlodipine. Holding lisinopril in the setting of AKI.   4. T2DM with hyperglycemia. Dyslipidemia. Fasting glucose this am 213. Continue glucose control and monitoring with insulin sliding scale. Advance diet post procedure.   Continue statin therapy with atorvastatin.   At home patient on metformin.   5. BPH. Continue with tamsulosin    Patient continue to be at high risk for worsening renal failure.   Status is: Inpatient  Remains inpatient appropriate because:IV treatments appropriate due to intensity of illness or inability to take PO   Dispo: The patient is from: Home              Anticipated d/c is to: Home              Patient currently is not medically stable to d/c.   Difficult to place patient No   DVT prophylaxis: Enoxaparin   Code Status:    full  Family Communication:  I spoke with patient's wife at the bedside, we talked in detail about patient's condition, plan of care and prognosis and all questions were addressed.      Nutrition Status:           Skin Documentation:     Consultants:   Urology   Procedures:   Cystoscopy, right ureteral stent placement.      Subjective: Patient with  improvement in abdominal pain, just completed the procedure,. Not yet back to baseline. No nausea or vomiting, no dyspnea or chest pain.   Objective: Vitals:   01/31/21 1339 01/31/21 1345 01/31/21 1400 01/31/21 1429  BP: (!) 144/92 (!) 146/92 (!) 153/75 (!) 165/82  Pulse: 75 72 67 67  Resp: 16 19 17 20   Temp: 98.2 F (36.8 C)  98.2 F (36.8 C) 98 F (36.7 C)  TempSrc:    Oral  SpO2: 100% 100% 100% 100%  Weight:      Height:        Intake/Output Summary (Last 24 hours) at 01/31/2021 1429 Last data filed at 01/31/2021 1427 Gross per 24 hour  Intake 2000 ml  Output 450 ml  Net 1550 ml   Filed Weights   01/31/21 0554  Weight: 88.4 kg    Examination:   General: Not in pain or dyspnea, deconditioned  Neurology: Awake and alert, non focal  E ENT: mild pallor, no icterus, oral mucosa moist Cardiovascular: No JVD. S1-S2 present, rhythmic, no gallops, rubs, or murmurs. No lower extremity edema. Pulmonary: positive breath sounds bilaterally, adequate air movement, no wheezing, rhonchi or rales. Gastrointestinal. Abdomen soft and non tender Skin. No rashes Musculoskeletal: no joint deformities     Data Reviewed: I have personally reviewed following labs and imaging studies  CBC: Recent Labs  Lab 01/26/21 1455 01/31/21 0015  WBC 12.5* 12.3*  NEUTROABS 11.6* 10.8*  HGB 14.1 14.5  HCT 42.3 43.5  MCV 84.4 84.1  PLT 182 186   Basic Metabolic Panel: Recent Labs  Lab 01/26/21 1455 01/31/21 0015  NA 138 134*  K 4.0 3.9  CL 102 98  CO2 24 24  GLUCOSE 304* 213*  BUN 18 46*  CREATININE 1.12 2.48*  CALCIUM 9.5 10.2   GFR: Estimated Creatinine Clearance: 28.8 mL/min (A) (by C-G formula based on SCr of 2.48 mg/dL (H)). Liver Function Tests: Recent Labs  Lab 01/26/21 1455 01/31/21 0015  AST 23 16  ALT 23 21  ALKPHOS 67 62  BILITOT 0.8 1.1  PROT 7.9 7.5  ALBUMIN 4.1 3.9   Recent Labs  Lab 01/31/21 0015  LIPASE 47   No results for input(s): AMMONIA in the  last 168 hours. Coagulation Profile: No results for input(s): INR, PROTIME in the last 168 hours. Cardiac Enzymes: No results for input(s): CKTOTAL, CKMB, CKMBINDEX, TROPONINI in the last 168 hours. BNP (last 3 results) No results for input(s): PROBNP in the last 8760 hours. HbA1C: Recent Labs    01/31/21 0015  HGBA1C 8.6*   CBG: Recent Labs  Lab 01/31/21 0735 01/31/21 1128 01/31/21 1343  GLUCAP 180* 124* 112*   Lipid Profile: No results for input(s): CHOL, HDL, LDLCALC, TRIG, CHOLHDL, LDLDIRECT in the last 72 hours. Thyroid Function Tests: No results for input(s): TSH, T4TOTAL, FREET4, T3FREE, THYROIDAB in the last 72 hours. Anemia Panel: No results for input(s): VITAMINB12, FOLATE, FERRITIN, TIBC, IRON, RETICCTPCT in the last 72 hours.    Radiology Studies: I have reviewed all of the imaging during this hospital visit personally  Scheduled Meds: . amLODipine  10 mg Oral q morning  . aspirin EC  81 mg Oral Daily  . atorvastatin  40 mg Oral QHS  . insulin aspart  0-15 Units Subcutaneous TID AC & HS  . tamsulosin  0.4 mg Oral Daily   Continuous Infusions: . lactated ringers 125 mL/hr at 01/31/21 1424     LOS: 0 days        Carleen Rhue Annett Gula, MD

## 2021-01-31 NOTE — ED Notes (Signed)
Lab called regarding urine sample

## 2021-01-31 NOTE — H&P (Signed)
History and Physical    [name] OldsJames H Soto ZOX:[id number] DOB: 28-May-1946 DOA: 01/30/2021  PCP: [name], [name], MD  Patient coming from: Home   Chief Complaint:  Chief Complaint  Patient presents with  . Abdominal Pain     HPI:    75 year old male with past medical history of diabetes mellitus type 2, gastroesophageal reflux disease, previous stroke (2013), hypertension and hyperlipidemia who [facility] with complaints of abdominal pain, flank pain nausea and vomiting.  [facility] on [facility]15 with sudden onset of abdominal pain and right flank pain.  Upon evaluation, CT imaging of the abdomen revealed a 7 mm right ureteral stone.  Patient was discharged home and arranges were made for the patient to have close follow-up with urology.  Explains that he did follow-up with urology and instructed and was scheduled to undergo urologic intervention on 5/20.  Patient was also placed on daily Flomax during this appointment.  Unfortunately, patient has continued to experience severe pain of the periumbilical region and right flank.  Pain has been sharp in quality, severe in intensity and waxing and waning.  Pain is associated with extremely poor appetite and frequent bouts of nausea vomiting with any attempt at consuming oral intake.  Patient denies any associated fevers, hematuria or sick contacts.  As the days have progressed patient has developed associated generalized weakness.  Due to patient's progressively worsening symptoms despite having intervention scheduled on 5/20 patient [facility] the evening of 5/19.  Upon evaluation in the emergency department, blood work revealed evidence of acute kidney injury with creatinine of 2.48.  This was discussed between the emergency department provider and Dr. Mena GoesEskridge with urology who recommended hospitalization for medicine to  manage patient's acute kidney injury has remained is ready for the patient to undergo urologic intervention on 5/20 while hospitalized.  The hospitalist group has now been called to assess the patient for admission to the hospital.  Review of Systems:   Review of Systems  Constitutional: Positive for malaise/fatigue.  Gastrointestinal: Positive for abdominal pain, nausea and vomiting.  Genitourinary: Positive for flank pain.  Neurological: Positive for weakness.  All other systems reviewed and are negative.   Past Medical History:  Diagnosis Date  . At risk for falls   . Balance problem   . Cerebral thrombosis with cerebral infarction (HCC)   . Diabetes mellitus without complication (HCC)   . GERD (gastroesophageal reflux disease)   . History of kidney stones   . Humerus fracture    PROXIMAL RIGHT  . Hypertension   . Nausea and vomiting   . Pancreatic lesion   . PONV (postoperative nausea and vomiting)   . Stroke [facility](HCC) 2012    Past Surgical History:  Procedure Laterality Date  . CATARACT EXTRACTION Bilateral   . EUS N/A 07/17/2015   Procedure: ESOPHAGEAL ENDOSCOPIC ULTRASOUND (EUS) RADIAL;  Surgeon: Willis ModenaWilliam Outlaw, MD;  Location: [facility] ENDOSCOPY;  Service: Endoscopy;  Laterality: N/A;  . FINE NEEDLE ASPIRATION N/A 07/17/2015   Procedure: FINE NEEDLE ASPIRATION (FNA) RADIAL;  Surgeon: Willis ModenaWilliam Outlaw, MD;  Location: [facility] ENDOS[facility];  Service: Endoscopy;  Laterality: N/A;  . HEMATOMA DRAINED    . HERNIA REPAIR     inguinal right  . HYDROCELE EXCISION    . ORIF HUMERUS FRACTURE Right 09/26/2020   Procedure: Open reduction internal fixation right humeral periprosthetic fracture;  Surgeon: Francena HanlySupple, Kevin, MD;  Location: [facility] ORS;  Service: Orthopedics;  Laterality: Right;   . REVERSE SHOULDER ARTHROPLASTY Right 05/07/2016   Procedure: RIGHT REVERSE TOTAL SHOULDER ARTHROPLASTY;  Surgeon: Francena Hanly, MD;  Location: [facility] OR;  Service: Orthopedics;  Laterality: Right;     reports that  he has quit smoking. His smoking use included cigarettes. He has never used smokeless tobacco. He reports that he does not drink alcohol and does not use drugs.  Allergies  Allergen Reactions  . Metformin Nausea And Vomiting    METFORMIN ER only, not immediate release    Family History  Problem Relation Age of Onset  . Heart disease Neg Hx      Prior to Admission medications   Medication Sig Start Date End Date Taking? Authorizing Provider  amLODipine (NORVASC) 10 MG tablet Take 10 mg by mouth every morning.   Yes [provider]  aspirin 81 MG tablet Take 81 mg by mouth daily.   Yes [provider]  atorvastatin (LIPITOR) 40 MG tablet Take 40 mg by mouth at bedtime.   Yes [provider]  Cholecalciferol (VITAMIN D3 PO) Take 500 mg by mouth daily.   Yes [provider]  HYDROcodone-acetaminophen (NORCO/VICODIN) 5-325 MG tablet Take 1-2 tablets by mouth every 4 (four) hours as needed. Patient taking differently: Take 1-2 tablets by mouth every 4 (four) hours as needed for moderate pain or severe pain. 01/26/21  Yes Rolan Bucco, MD  lisinopril (PRINIVIL,ZESTRIL) 40 MG tablet Take 40 mg by mouth daily.   Yes [provider]  metFORMIN (GLUCOPHAGE) 500 MG tablet Take 500 mg by mouth 2 (two) times daily with a meal. 07/31/20  Yes [provider]  Multiple Vitamin (MULTI-VITAMINS) TABS Take 1 tablet by mouth daily.   Yes [provider]  ondansetron (ZOFRAN-ODT) 8 MG disintegrating tablet Take 8 mg by mouth every 8 (eight) hours as needed for nausea or vomiting.   Yes [provider]  Polyethyl Glycol-Propyl Glycol (SYSTANE) 0.4-0.3 % SOLN Place 1 drop into both eyes daily as needed (Dry eyes).   Yes [provider]  promethazine (PHENERGAN) 25 MG tablet Take 25 mg by mouth every 6 (six) hours as needed for nausea or vomiting.   Yes [provider]  tamsulosin (FLOMAX) 0.4 MG CAPS capsule Take 1 capsule  (0.4 mg total) by mouth daily. 01/26/21  Yes Rolan Bucco, MD  zinc gluconate 50 MG tablet Take 50 mg by mouth daily.   Yes [provider]    Physical Exam: Vitals:   01/31/21 0430 01/31/21 0445 01/31/21 0554 01/31/21 0557  BP: (!) 158/86 (!) 149/81  (!) 162/82  Pulse: 100 98  98  Resp: 17 16  20   Temp:    98.9 F (37.2 C)  TempSrc:    Oral  SpO2: 90% 90%  99%  Weight:   88.4 kg   Height:   5\' 10"  (1.778 m)     Constitutional: Awake alert and oriented x3, patient is in some distress due to abdominal pain.   Skin: no rashes, no lesions, poor skin turgor noted. Eyes: Pupils are equally reactive to light.  No evidence of scleral icterus or conjunctival pallor.  ENMT: Dry mucous membranes noted.  Posterior pharynx clear of any exudate or lesions.   Neck: normal, supple, no masses, no thyromegaly.  No evidence of jugular venous distension.   Respiratory: clear to auscultation bilaterally, no wheezing, no crackles. Normal respiratory effort. No accessory muscle use.  Cardiovascular: Regular rate and rhythm, no murmurs / rubs /  gallops. No extremity edema. 2+ pedal pulses. No carotid bruits.  Chest:   Nontender without crepitus or deformity.   Back:   Nontender without crepitus or deformity. Abdomen: Vague periumbilical tenderness as well as right abdominal tenderness.  Abdomen is soft however.  No evidence of intra-abdominal masses.  Positive bowel sounds noted in all quadrants.   Musculoskeletal: No joint deformity upper and lower extremities. Good ROM, no contractures. Normal muscle tone.  Neurologic: CN 2-12 grossly intact. Sensation intact.  Patient moving all 4 extremities spontaneously.  Patient is following all commands.  Patient is responsive to verbal stimuli.   Psychiatric: Patient exhibits normal mood with appropriate affect.  Patient seems to possess insight as to their current situation.     Labs on Admission: I have personally reviewed following labs and imaging  studies -   CBC: Recent Labs  Lab 01/26/21 1455 01/31/21 0015  WBC 12.5* 12.3*  NEUTROABS 11.6* 10.8*  HGB 14.1 14.5  HCT 42.3 43.5  MCV 84.4 84.1  PLT 182 186   Basic Metabolic Panel: Recent Labs  Lab 01/26/21 1455 01/31/21 0015  NA 138 134*  K 4.0 3.9  CL 102 98  CO2 24 24  GLUCOSE 304* 213*  BUN 18 46*  CREATININE 1.12 2.48*  CALCIUM 9.5 10.2   GFR: Estimated Creatinine Clearance: 28.8 mL/min (A) (by C-G formula based on SCr of 2.48 mg/dL (H)). Liver Function Tests: Recent Labs  Lab 01/26/21 1455 01/31/21 0015  AST 23 16  ALT 23 21  ALKPHOS 67 62  BILITOT 0.8 1.1  PROT 7.9 7.5  ALBUMIN 4.1 3.9   Recent Labs  Lab 01/31/21 0015  LIPASE 47   No results for input(s): AMMONIA in the last 168 hours. Coagulation Profile: No results for input(s): INR, PROTIME in the last 168 hours. Cardiac Enzymes: No results for input(s): CKTOTAL, CKMB, CKMBINDEX, TROPONINI in the last 168 hours. BNP (last 3 results) No results for input(s): PROBNP in the last 8760 hours. HbA1C: No results for input(s): HGBA1C in the last 72 hours. CBG: No results for input(s): GLUCAP in the last 168 hours. Lipid Profile: No results for input(s): CHOL, HDL, LDLCALC, TRIG, CHOLHDL, LDLDIRECT in the last 72 hours. Thyroid Function Tests: No results for input(s): TSH, T4TOTAL, FREET4, T3FREE, THYROIDAB in the last 72 hours. Anemia Panel: No results for input(s): VITAMINB12, FOLATE, FERRITIN, TIBC, IRON, RETICCTPCT in the last 72 hours. Urine analysis:    Component Value Date/Time   COLORURINE YELLOW 01/31/2021 0358   APPEARANCEUR CLEAR 01/31/2021 0358   LABSPEC 1.017 01/31/2021 0358   PHURINE 5.0 01/31/2021 0358   GLUCOSEU >=500 (A) 01/31/2021 0358   HGBUR MODERATE (A) 01/31/2021 0358   BILIRUBINUR NEGATIVE 01/31/2021 0358   KETONESUR 5 (A) 01/31/2021 0358   PROTEINUR NEGATIVE 01/31/2021 0358   UROBILINOGEN 0.2 03/31/2011 1813   NITRITE NEGATIVE 01/31/2021 0358   LEUKOCYTESUR  NEGATIVE 01/31/2021 0358    Radiological Exams on Admission - Personally Reviewed: No results found.   Assessment/Plan Principal Problem:   Acute kidney injury [facility])   Patient presenting with acute kidney injury with creatinine of 2.48  Etiology is likely due to a combination of persisting right ureteral stone as well as prerenal injury due to poor oral intake and frequent vomiting  Urinalysis reveals no evidence of concurrent urinary tract infection  Hydrating patient with intravenous isotonic fluid  Strict input and output monitoring  Holding home regimen of lisinopril  Monitoring renal function and electrolytes with serial chemistries  Patient  to undergo intervention for right ureteral stone later today with urology which should also help improve renal function.  Active Problems:   Right ureteral stone   Patient was to undergo outpatient intervention today on 5/20 but due to intractable symptoms ended up presenting to the emergency department overnight.  Case discussed between emergency department fighter and Dr. Mena Goes who stated that they would proceed with intervention while patient is hospitalized later today.  Managing associated acute kidney injury as above  As needed opiate-based analgesics and antiemetics for associated symptoms  Flomax daily    Essential hypertension   Continue home regimen of amlodipine  Holding home regimen of lisinopril due to acute kidney injury  .  Intravenous antihypertensives for markedly elevated blood pressure.    Type 2 diabetes mellitus with complication, without long-term current use of insulin (HCC)  . Patient been placed on Accu-Cheks before every meal and nightly with sliding scale insulin . Holding home regimen of hypoglycemics . Hemoglobin A1C ordered . Diabetic Diet    Mixed diabetic hyperlipidemia associated with type 2 diabetes mellitus (HCC)   Continue home regimen of statin therapy     Code Status:   Full code Family Communication: deferred   Status is: Observation  The patient remains OBS appropriate and will d/c before 2 midnights.  Dispo: The patient is from: Home              Anticipated d/c is to: Home              Patient currently is not medically stable to d/c.   Difficult to place patient No        Marinda Elk MD [facility] Pager [phone]  If 7PM-7AM, please contact night-coverage [email or web address] Use universal  password for that web site. If you do not have the password, please call the hospital operator.  01/31/2021, 6:00 AM

## 2021-01-31 NOTE — Transfer of Care (Signed)
Immediate Anesthesia Transfer of Care Note  Patient: [name] Soto  Procedure(s) Performed: CYSTOSCOPY WITH RIGHT  RETROGRADE URETEROSCOPY HOLMIUM LASER AND STENT PLACEMENT (Right )  Patient Location: PACU  Anesthesia Type:General  Level of Consciousness: awake, alert  and patient cooperative  Airway & Oxygen Therapy: Patient Spontanous Breathing and Patient connected to face mask  Post-op Assessment: Report given to RN and Post -op Vital signs reviewed and stable  Post vital signs: Reviewed and stable  Last Vitals:  Vitals Value Taken Time  BP 144/92 01/31/21 1339  Temp    Pulse 77 01/31/21 1342  Resp 22 01/31/21 1342  SpO2 100 % 01/31/21 1342  Vitals shown include unvalidated device data.  Last Pain:  Vitals:   01/31/21 1136  TempSrc:   PainSc: 4          Complications: No complications documented.

## 2021-01-31 NOTE — Plan of Care (Signed)

## 2021-01-31 NOTE — ED Provider Notes (Signed)
[facility]-EMERGENCY DEPT [facility] Emergency Department Provider Note MRN:  [id number]  Arrival date & time: 01/31/21     Chief Complaint   Abdominal Pain   History of Present Illness   [name] Soto is a 75 y.o. year-old male with a history of diabetes presenting to the ED with chief complaint of abdominal pain.  Right flank and right lower quadrant abdominal pain for the past few days, becoming more severe this evening with persistent nausea and vomiting, unable to eat or drink anything.  Was diagnosed with a kidney stone a few days ago, is scheduled to have a stent placed this morning.  Denies fever, no other complaints.  Symptoms constant, gradual onset, moderate to severe, no exacerbating or alleviating factors.  Review of Systems  A complete 10 system review of systems was obtained and all systems are negative except as noted in the HPI and PMH.   Patient's Health History    Past Medical History:  Diagnosis Date  . At risk for falls   . Balance problem   . Cerebral thrombosis with cerebral infarction (HCC)   . Diabetes mellitus without complication (HCC)   . GERD (gastroesophageal reflux disease)   . History of kidney stones   . Humerus fracture    PROXIMAL RIGHT  . Hypertension   . Nausea and vomiting   . Pancreatic lesion   . PONV (postoperative nausea and vomiting)   . Stroke [facility]) 2012    Past Surgical History:  Procedure Laterality Date  . CATARACT EXTRACTION Bilateral   . EUS N/A 07/17/2015   Procedure: ESOPHAGEAL ENDOSCOPIC ULTRASOUND (EUS) RADIAL;  Surgeon: Willis Modena, MD;  Location: [facility]OS[facility];  Service: Endoscopy;  Laterality: N/A;  . FINE NEEDLE ASPIRATION N/A 07/17/2015   Procedure: FINE NEEDLE ASPIRATION (FNA) RADIAL;  Surgeon: Willis Modena, MD;  Location: [facility];  Service: Endoscopy;  Laterality: N/A;  . HEMATOMA DRAINED    . HERNIA REPAIR     inguinal right  . HYDROCELE EXCISION    . ORIF HUMERUS FRACTURE Right 09/26/2020   Procedure:  Open reduction internal fixation right humeral periprosthetic fracture;  Surgeon: Francena Hanly, MD;  Location: [facility] ORS;  Service: Orthopedics;  Laterality: Right;   . REVERSE SHOULDER ARTHROPLASTY Right 05/07/2016   Procedure: RIGHT REVERSE TOTAL SHOULDER ARTHROPLASTY;  Surgeon: Francena Hanly, MD;  Location: [facility] OR;  Service: Orthopedics;  Laterality: Right;    No family history on file.  Social History   Socioeconomic History  . Marital status: Married    Spouse name: Not on file  . Number of children: Not on file  . Years of education: Not on file  . Highest education level: Not on file  Occupational History  . Not on file  Tobacco Use  . Smoking status: Former Smoker    Types: Cigarettes  . Smokeless tobacco: Never Used  . Tobacco comment: QUIT SMOKING IN THE 70'S  Vaping Use  . Vaping Use: Never used  Substance and Sexual Activity  . Alcohol use: No  . Drug use: No  . Sexual activity: Not on file  Other Topics Concern  . Not on file  Social History Narrative  . Not on file   Social Determinants of Health   Financial Resource Strain: Not on file  Food Insecurity: Not on file  Transportation Needs: Not on file  Physical Activity: Not on file  Stress: Not on file  Social Connections: Not on file  Intimate Partner Violence: Not on file  Physical Exam   Vitals:   01/31/21 0400 01/31/21 0415  BP: (!) 158/84 (!) 156/83  Pulse: (!) 102 98  Resp: 18 (!) 23  Temp:    SpO2: 93% 94%    CONSTITUTIONAL: Chronically ill-appearing, NAD NEURO:  Alert and oriented x 3, no focal deficits EYES:  eyes equal and reactive ENT/NECK:  no LAD, no JVD CARDIO: Regular rate, well-perfused, normal S1 and S2 PULM:  CTAB no wheezing or rhonchi GI/GU:  normal bowel sounds, non-distended, non-tender MSK/SPINE:  No gross deformities, no edema SKIN:  no rash, atraumatic PSYCH:  Appropriate speech and behavior  *Additional and/or pertinent findings included in MDM  below  Diagnostic and Interventional Summary    EKG Interpretation  Date/Time:    Ventricular Rate:    PR Interval:    QRS Duration:   QT Interval:    QTC Calculation:   R Axis:     Text Interpretation:        Labs Reviewed  CBC WITH DIFFERENTIAL/PLATELET - Abnormal; Notable for the following components:      Result Value   WBC 12.3 (*)    RDW 16.1 (*)    Neutro Abs 10.8 (*)    Lymphs Abs 0.5 (*)    All other components within normal limits  COMPREHENSIVE METABOLIC PANEL - Abnormal; Notable for the following components:   Sodium 134 (*)    Glucose, Bld 213 (*)    BUN 46 (*)    Creatinine, Ser 2.48 (*)    GFR, Estimated 26 (*)    All other components within normal limits  RESP PANEL BY RT-PCR (FLU A&B, COVID) ARPGX2  LIPASE, BLOOD  URINALYSIS, ROUTINE W REFLEX MICROSCOPIC    No orders to display    Medications  morphine 4 MG/ML injection 4 mg (4 mg Intravenous Given 01/31/21 0131)  ondansetron (ZOFRAN) injection 4 mg (4 mg Intravenous Given 01/31/21 0126)  sodium chloride 0.9 % bolus 1,000 mL (0 mLs Intravenous Stopped 01/31/21 0247)  morphine 2 MG/ML injection 2 mg (2 mg Intravenous Given 01/31/21 0248)  ondansetron (ZOFRAN) injection 4 mg (4 mg Intravenous Given 01/31/21 0248)  lactated ringers bolus 1,000 mL (1,000 mLs Intravenous New Bag/Given 01/31/21 0247)     Procedures  /  Critical Care Procedures  ED Course and Medical Decision Making  I have reviewed the triage vital signs, the nursing notes, and pertinent available records from the EMR.  Listed above are laboratory and imaging tests that I personally ordered, reviewed, and interpreted and then considered in my medical decision making (see below for details).  Known 7 mm kidney stone with worsening pain.  No fever, nothing to suggest sepsis or infected stone, recent urinalysis was normal.  Pain and nausea vomiting much better controlled with Zofran and morphine.  Discussed case with Dr. Mena Goes urology,  reasonable to admit to hospitalist service for further symptomatic management and management of AKI.  Scheduled to go to [facility] operating room at 11 AM.       Elmer Sow. Pilar Plate, MD [facility] Emergency Medicine [facility] [email or web address]  Final Clinical Impressions(s) / ED Diagnoses     ICD-10-CM   1. Vomiting without nausea, intractability of vomiting not specified, unspecified vomiting type  R11.11   2. Kidney stone  N20.0     ED Discharge Orders    None       Discharge Instructions Discussed with and Provided to Patient:   Discharge Instructions   None  Sabas Sous, MD 01/31/21 [phone]

## 2021-01-31 NOTE — Progress Notes (Signed)
   Subjective/Chief Complaint:  1 - Right Distal Ureteral Stone - 48mm Rt distal stone just below iliacs by Er CT 01/26/21. Cr 1.1, UCX negative. Stone is solitary. He does have some trilobar prostatic hypertrophy.   2 - Acute Renal Failure - Baselien Cr 1.1 with rise to 2.4 by ER labs 5/19. Admits to very poor PO intake x few days, known Rt distal stone.   PMH sig for obesty, DM2 (A1c 7s), open inguinal hernia repair, ortho surgery, CVA (no deficits). His PCP is Guerry Bruin. 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   Today " Rosanne Ashing " is seen to proceed with RIGHT ureteroscopic stone maniuplation. He was admitted for ARF/dehydration and refracotry colic last night. No interval fevers. C19 screen negative. Most recetn UA withotu overt infectious parameters.      Objective: Vital signs in last 24 hours: Temp:  [98.5 F (36.9 C)-98.9 F (37.2 C)] 98.9 F (37.2 C) (05/20 0557) Pulse Rate:  [90-102] 98 (05/20 0557) Resp:  [14-23] 20 (05/20 0557) BP: (113-171)/(65-92) 162/82 (05/20 0557) SpO2:  [90 %-100 %] 99 % (05/20 0557) Weight:  [88.4 kg-94.3 kg] 88.4 kg (05/20 0554)    Intake/Output from previous day: 05/19 0701 - 05/20 0700 In: 1000 [IV Piggyback:1000] Out: -  Intake/Output this shift: No intake/output data recorded.  General appearance: alert, cooperative and at baseline  Eyes: negative Nose: Nares normal. Septum midline. Mucosa normal. No drainage or sinus tenderness. Throat: lips, mucosa, and tongue normal; teeth and gums normal Neck: supple, symmetrical, trachea midline Back: symmetric, no curvature. ROM normal. No CVA tenderness. Resp: non-labored on RA Cardio: Nl rate at present.  Male genitalia: normal Extremities: extremities normal, atraumatic, no cyanosis or edema Lymph nodes: Cervical, supraclavicular, and axillary nodes normal. Neurologic: Grossly normal  Lab Results:  Recent Labs    01/31/21 0015  WBC 12.3*  HGB 14.5  HCT 43.5  PLT 186   BMET Recent Labs     01/31/21 0015  NA 134*  K 3.9  CL 98  CO2 24  GLUCOSE 213*  BUN 46*  CREATININE 2.48*  CALCIUM 10.2   PT/INR No results for input(s): LABPROT, INR in the last 72 hours. ABG No results for input(s): PHART, HCO3 in the last 72 hours.  Invalid input(s): PCO2, PO2  Studies/Results: No results found.  Anti-infectives: Anti-infectives (From admission, onward)   None      Assessment/Plan:  Proceed as planned with RIGHT ureteroscopic stone manipulation. Risks, benefits, alternatives, expected peri-op course discussed previously and reiterated today. I feel most prudent to proceed as his stone is underlying reason for his ART/dehydration.   [name] 01/31/2021

## 2021-02-01 DIAGNOSIS — K219 Gastro-esophageal reflux disease without esophagitis: Secondary | ICD-10-CM | POA: Diagnosis not present

## 2021-02-01 DIAGNOSIS — E118 Type 2 diabetes mellitus with unspecified complications: Secondary | ICD-10-CM | POA: Diagnosis not present

## 2021-02-01 DIAGNOSIS — N201 Calculus of ureter: Secondary | ICD-10-CM | POA: Diagnosis not present

## 2021-02-01 DIAGNOSIS — N179 Acute kidney failure, unspecified: Secondary | ICD-10-CM | POA: Diagnosis not present

## 2021-02-01 LAB — BASIC METABOLIC PANEL
Anion gap: 10 (ref 5–15)
BUN: 23 mg/dL (ref 8–23)
CO2: 26 mmol/L (ref 22–32)
Calcium: 8.7 mg/dL — ABNORMAL LOW (ref 8.9–10.3)
Chloride: 104 mmol/L (ref 98–111)
Creatinine, Ser: 0.94 mg/dL (ref 0.61–1.24)
GFR, Estimated: >60 mL/min (ref >60)
Glucose, Bld: 128 mg/dL — ABNORMAL HIGH (ref 70–99)
Potassium: 3.4 mmol/L — ABNORMAL LOW (ref 3.5–5.1)
Sodium: 140 mmol/L (ref 135–145)

## 2021-02-01 LAB — GLUCOSE, CAPILLARY
Glucose-Capillary: 116 mg/dL — ABNORMAL HIGH (ref 70–99)
Glucose-Capillary: 133 mg/dL — ABNORMAL HIGH (ref 70–99)

## 2021-02-01 MED ORDER — ACETAMINOPHEN 325 MG PO TABS: 650.0000 mg | ORAL_TABLET | Freq: Four times a day (QID) | ORAL | Status: DC | PRN

## 2021-02-01 MED ORDER — POTASSIUM CHLORIDE CRYS ER 20 MEQ PO TBCR: 40.0000 meq | EXTENDED_RELEASE_TABLET | Freq: Once | ORAL | Status: DC

## 2021-02-01 NOTE — Progress Notes (Signed)
Pt and wife had questions regarding medications that were being given to the pt and the cost that would accumulate from [place] providing the medications. Wife has medications at bedside and was wanting to just administer those to the pt. RN advised wife to not administer anything until MD spoke with family. RN notified MD and MD came to bedside to speak with wife and patient.

## 2021-02-01 NOTE — Discharge Summary (Signed)
Physician Discharge Summary  [name] Soto RUE:[id number] DOB: 01-25-1946 DOA: 01/30/2021  PCP: Cheron Schaumann., MD  Admit date: 01/30/2021 Discharge date: 02/01/2021  Admitted From: Home  Disposition:  Home   Recommendations for Outpatient Follow-up and new medication changes:  1. Follow up with [facility] in 7 to 10 days.  2. Follow up with Urology Dr Berneice Heinrich, call the office on 02/03/21, for follow up appointment for stent removal.   Home Health: na   Equipment/Devices: na    Discharge Condition: stable  CODE STATUS: full  Diet recommendation:  Heart healthy and diabetic prudent.   Brief/Interim Summary: [name] was admitted to the hospital with working diagnosis of acute kidney injury in the setting of right ureteral stone and right hydronephrsis.  75 year old male past medical history for type 2 diabetes mellitus, GERD, CVA 2013, hypertension, dyslipidemia who presented with abdominal pain, flank pain, nausea and vomiting.  Patient was diagnosed with a 7 mm right ureteral stone 5/15 in the ED, instructions to follow-up with urology.  At home patient had recurrent abdominal pain, more severe in intensity, sharp pain nature, associated with nausea and vomiting.  He was scheduled to have urology procedure but due to severe symptoms he came to the ED instead.  On his initial physical examination blood pressure 158/86, heart rate 100, respirate 17, oxygen saturation 90-99%, patient had dry mucous membranes, his lungs were clear to auscultation bilaterally, heart S1-S2, present, rhythmic, abdomen had periumbilical tenderness, soft, no guarding, no lower extremity edema.  Sodium 134, potassium 3.9, chloride 98, bicarb 24, glucose 213, BUN 46, creatinine 2.48, white count 12.3, hemoglobin 14.5, hematocrit 43.5, platelets 186. SARS COVID-19 negative.  Urinalysis specific gravity 1.017, 21-50 red cells, 0-5 white cells, negative protein,> 500 glucose.  Patient was placed on IV fluids  and as needed IV analgesics.  5/20 underwent cystoscopy with right retrograde ureteroscopy, holmium laser and right stent placement.   Follow up renal US with resolved hydronephrosis. BMP with resolution of renal failure.   1.  Acute kidney injury, hypokalemia likely prerenal, patient received supportive medical therapy with intravenous fluids with isotonic saline.  He responded very well, sooner than expected. His discharge creatinine is 0.94, BUN 23.  His potassium was 3.4 that will be repleted before discharge.   Repeat ultrasonography showed resolved hydronephrosis following stone removal.  Left peripelvic cyst stable.  2.  Urolithiasis, 7 mm calculus in the distal right ureter, proximal to the UVJ.  Patient underwent cystoscopy with retrograde ureteroscopy with right stent placement.  Stone was removed. Patient received analgesics with good toleration.  After stone removal his symptoms remarkably improved.  Patient will need to follow-up in 3 to 4 days with urology as an outpatient for stent removal.  I did talk to Dr. Dulce Sellar urology on-call.  3.  Hypertension.  His blood pressure remained stable during his hospitalization.  At discharge will continue taking amlodipine and lisinopril. Note that he is on high-dose ACE inhibitor.  4.  Type 2 diabetes mellitus, hyperglycemia.  Dyslipidemia.  Patient received insulin sliding scale during his hospitalization for glucose control.  At discharge she will resume metformin. Continue atorvastatin.  5. BPH. Continue with tamsulosin.    Discharge Diagnoses:  Principal Problem:   Acute kidney injury [facility]) Active Problems:   Right ureteral stone   Essential hypertension   GERD without esophagitis   Type 2 diabetes mellitus with complication, without long-term current use of insulin (HCC)   Mixed diabetic hyperlipidemia associated with type 2 diabetes  mellitus (HCC)   Acute renal failure (ARF) (HCC)    Discharge  Instructions   Allergies as of 02/01/2021      Reactions   Metformin Nausea And Vomiting   METFORMIN ER only, not immediate release      Medication List    TAKE these medications   acetaminophen 325 MG tablet Commonly known as: TYLENOL Take 2 tablets (650 mg total) by mouth every 6 (six) hours as needed for moderate pain.   amLODipine 10 MG tablet Commonly known as: NORVASC Take 10 mg by mouth every morning.   aspirin 81 MG tablet Take 81 mg by mouth daily.   atorvastatin 40 MG tablet Commonly known as: LIPITOR Take 40 mg by mouth at bedtime.   HYDROcodone-acetaminophen 5-325 MG tablet Commonly known as: NORCO/VICODIN Take 1-2 tablets by mouth every 4 (four) hours as needed. What changed: reasons to take this   lisinopril 40 MG tablet Commonly known as: ZESTRIL Take 40 mg by mouth daily.   metFORMIN 500 MG tablet Commonly known as: GLUCOPHAGE Take 500 mg by mouth 2 (two) times daily with a meal.   Multi-Vitamins Tabs Take 1 tablet by mouth daily.   ondansetron 8 MG disintegrating tablet Commonly known as: ZOFRAN-ODT Take 8 mg by mouth every 8 (eight) hours as needed for nausea or vomiting.   promethazine 25 MG tablet Commonly known as: PHENERGAN Take 25 mg by mouth every 6 (six) hours as needed for nausea or vomiting.   Systane 0.4-0.3 % Soln Generic drug: Polyethyl Glycol-Propyl Glycol Place 1 drop into both eyes daily as needed (Dry eyes).   tamsulosin 0.4 MG Caps capsule Commonly known as: Flomax Take 1 capsule (0.4 mg total) by mouth daily.   VITAMIN D3 PO Take 500 mg by mouth daily.   zinc gluconate 50 MG tablet Take 50 mg by mouth daily.       Follow-up Information    [name], MD On 02/20/2021.   Specialty: Urology Why: at 9:15 for MD visit.  Contact information: [address] [place] [address] [phone]              Allergies  Allergen Reactions  . Metformin Nausea And Vomiting    METFORMIN ER only, not immediate  release    Consultations:  Urology    Procedures/Studies: US RENAL  Result Date: 01/31/2021 CLINICAL DATA:  History of distal right ureteral stone and acute renal injury, recent stone removal EXAM: RENAL / URINARY TRACT ULTRASOUND COMPLETE COMPARISON:  01/26/2021 FINDINGS: Right Kidney: Renal measurements: 12.5 x 5.8 x 6.6 cm. = volume: 250 mL. Previously seen hydronephrosis has resolved following stone removal today. Left Kidney: Renal measurements: 11.4 x 5.9 x 5.2 cm. = volume: 183 mL. 2.9 cm peripelvic cyst is noted in the midportion of the left kidney. Bladder: Appears normal for degree of bladder distention. Other: None. IMPRESSION: Previously seen right-sided hydronephrosis has resolved following stone removal. Left peripelvic cyst stable from prior CT. Electronically Signed   By: Alcide Clever M.D.   On: 01/31/2021 19:08   DG C-Arm 1-60 Min-No Report  Result Date: 01/31/2021 Fluoroscopy was utilized by the requesting physician.  No radiographic interpretation.   CT Renal Stone Study  Result Date: 01/26/2021 CLINICAL DATA:  Right-sided back and groin pain with nausea and vomiting. EXAM: CT ABDOMEN AND PELVIS WITHOUT CONTRAST TECHNIQUE: Multidetector CT imaging of the abdomen and pelvis was performed following the standard protocol without IV contrast. COMPARISON:  MRI abdomen and pelvis dated December 01, 2018. CT abdomen pelvis dated April 24, 2015. FINDINGS: Lower chest: No acute abnormality. Hepatobiliary: No focal liver abnormality. Multiple gallstones again noted. No gallbladder wall thickening or biliary dilatation. Pancreas: Unremarkable. No pancreatic ductal dilatation or surrounding inflammatory changes. 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 Spleen: Normal in size without focal abnormality. Adrenals/Urinary Tract: The adrenal glands are unremarkable. Bilateral perinephric fat stranding. Left renal parapelvic cysts. 7 mm calculus in the distal  right ureter approximately 2.5 cm proximal to the UVJ with resultant mild right hydroureteronephrosis. The bladder is unremarkable. Stomach/Bowel: Small hiatal hernia. The stomach is otherwise within normal limits. No bowel wall thickening, distention, or surrounding inflammatory changes. Normal appendix. Vascular/Lymphatic: Aortic atherosclerosis. No enlarged abdominal or pelvic lymph nodes. Reproductive: Unchanged mild prostatomegaly. Other: No abdominal wall hernia or abnormality. No abdominopelvic ascites. No pneumoperitoneum. Musculoskeletal: No acute or significant osseous findings. Chronic T11 compression deformity. Grade 1 anterolisthesis at L4-L5 due to severe facet arthropathy. IMPRESSION: 1. 7 mm calculus in the distal right ureter approximately 2.5 cm proximal to the UVJ with resultant mild right hydroureteronephrosis. 2. Unchanged cholelithiasis. 3. 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 Per consensus guidelines, follow-up pancreatic protocol MRI or CT is recommended in 2 years. 4. Aortic Atherosclerosis (ICD10-I70.0). Electronically Signed   By: Obie Dredge M.D.   On: 01/26/2021 16:11      Procedures:  PROCEDURE:  Procedure(s): CYSTOSCOPY WITH RIGHT  RETROGRADE URETEROSCOPY HOLMIUM LASER AND STENT PLACEMENT (Right)  Subjective: Patient is feeling better, no nausea or vomiting, no chest pain or dyspnea, abdominal pain continue to improve.   Discharge Exam: Vitals:   01/31/21 2319 02/01/21 0528  BP: 129/66 (!) 159/72  Pulse: (!) 59 (!) 52  Resp: 12   Temp: 98.5 F (36.9 C) 98.4 F (36.9 C)  SpO2: 95% 96%   Vitals:   01/31/21 1429 01/31/21 2117 01/31/21 2319 02/01/21 0528  BP: (!) 165/82 125/70 129/66 (!) 159/72  Pulse: 67 62 (!) 59 (!) 52  Resp: 20 20 12    Temp: 98 F (36.7 C) 99.1 F (37.3 C) 98.5 F (36.9 C) 98.4 F (36.9 C)  TempSrc: Oral     SpO2: 100% 97% 95% 96%  Weight:      Height:        General: Not in pain or  dyspnea,  Neurology: Awake and alert, non focal  E ENT: no pallor, no icterus, oral mucosa moist Cardiovascular: No JVD. S1-S2 present, rhythmic, no gallops, rubs, or murmurs. No lower extremity edema. Pulmonary: positive breath sounds bilaterally, adequate air movement, no wheezing, rhonchi or rales. Gastrointestinal. Abdomen protuberant, soft and non tender Skin. No rashes Musculoskeletal: no joint deformities   The results of significant diagnostics from this hospitalization (including imaging, microbiology, ancillary and laboratory) are listed below for reference.     Microbiology: Recent Results (from the past 240 hour(s))  Urine culture     Status: None   Collection Time: 01/26/21  3:27 PM   Specimen: Urine, Random  Result Value Ref Range Status   Specimen Description   Final    URINE, RANDOM Performed at [facility], [address] [place], [place]    Special Requests   Final    NONE Performed at [facility], [address] [place], [place]    Culture   Final    NO GROWTH Performed at [facility], [address], [place], [place] [address]    Report Status 01/28/2021 FINAL  Final  Resp Panel by RT-PCR (Flu A&B, Covid) Nasopharyngeal Swab     Status: None   Collection Time: 01/31/21  3:29 AM   Specimen: Nasopharyngeal Swab; Nasopharyngeal(NP) swabs in vial transport medium  Result Value Ref Range Status   SARS Coronavirus 2 by RT PCR NEGATIVE NEGATIVE Final    Comment: (NOTE) SARS-CoV-2 target nucleic acids are NOT DETECTED.  The SARS-CoV-2 RNA is generally detectable in upper respiratory specimens during the acute phase of infection. The lowest concentration of SARS-CoV-2 viral copies this assay can detect is 138 copies/mL. A negative result does not preclude SARS-Cov-2 infection and should not be used as the sole basis for treatment or other patient management decisions. A negative result may occur with   improper specimen collection/handling, submission of specimen other than nasopharyngeal swab, presence of viral mutation(s) within the areas targeted by this assay, and inadequate number of viral copies(<138 copies/mL). A negative result must be combined with clinical observations, patient history, and epidemiological information. The expected result is Negative.  Fact Sheet for Patients:  BloggerCourse.com  Fact Sheet for Healthcare Providers:  SeriousBroker.it  This test is no t yet approved or cleared by the [place] FDA and  has been authorized for detection and/or diagnosis of SARS-CoV-2 by FDA under an Emergency Use Authorization (EUA). This EUA will remain  in effect (meaning this test can be used) for the duration of the COVID-19 declaration under Section 564(b)(1) of the Act, 21 U.S.C.section 360bbb-3(b)(1), unless the authorization is terminated  or revoked sooner.       Influenza A by PCR NEGATIVE NEGATIVE Final   Influenza B by PCR NEGATIVE NEGATIVE Final    Comment: (NOTE) The Xpert Xpress SARS-CoV-2/FLU/RSV plus assay is intended as an aid in the diagnosis of influenza from Nasopharyngeal swab specimens and should not be used as a sole basis for treatment. Nasal washings and aspirates are unacceptable for Xpert Xpress SARS-CoV-2/FLU/RSV testing.  Fact Sheet for Patients: BloggerCourse.com  Fact Sheet for Healthcare Providers: SeriousBroker.it  This test is not yet approved or cleared by the [place] FDA and has been authorized for detection and/or diagnosis of SARS-CoV-2 by FDA under an Emergency Use Authorization (EUA). This EUA will remain in effect (meaning this test can be used) for the duration of the COVID-19 declaration under Section 564(b)(1) of the Act, 21 U.S.C. section 360bbb-3(b)(1), unless the authorization is terminated  or revoked.  Performed at [facility], [address], [place], [place] [address]      Labs: BNP (last 3 results) No results for input(s): BNP in the last 8760 hours. Basic Metabolic Panel: Recent Labs  Lab 01/26/21 1455 01/31/21 0015 02/01/21 0537  NA 138 134* 140  K 4.0 3.9 3.4*  CL 102 98 104  CO2 24 24 26   GLUCOSE 304* 213* 128*  BUN 18 46* 23  CREATININE 1.12 2.48* 0.94  CALCIUM 9.5 10.2 8.7*   Liver Function Tests: Recent Labs  Lab 01/26/21 1455 01/31/21 0015  AST 23 16  ALT 23 21  ALKPHOS 67 62  BILITOT 0.8 1.1  PROT 7.9 7.5  ALBUMIN 4.1 3.9   Recent Labs  Lab 01/31/21 0015  LIPASE 47   No results for input(s): AMMONIA in the last 168 hours. CBC: Recent Labs  Lab 01/26/21 1455 01/31/21 0015  WBC 12.5* 12.3*  NEUTROABS 11.6* 10.8*  HGB 14.1 14.5  HCT 42.3 43.5  MCV  84.4 84.1  PLT 182 186   Cardiac Enzymes: No results for input(s): CKTOTAL, CKMB, CKMBINDEX, TROPONINI in the last 168 hours. BNP: Invalid input(s): POCBNP CBG: Recent Labs  Lab 01/31/21 1128 01/31/21 1343 01/31/21 1658 01/31/21 2115 02/01/21 0730  GLUCAP 124* 112* 176* 220* 116*   D-Dimer No results for input(s): DDIMER in the last 72 hours. Hgb A1c Recent Labs    01/31/21 0015  HGBA1C 8.6*   Lipid Profile No results for input(s): CHOL, HDL, LDLCALC, TRIG, CHOLHDL, LDLDIRECT in the last 72 hours. Thyroid function studies No results for input(s): TSH, T4TOTAL, T3FREE, THYROIDAB in the last 72 hours.  Invalid input(s): FREET3 Anemia work up No results for input(s): VITAMINB12, FOLATE, FERRITIN, TIBC, IRON, RETICCTPCT in the last 72 hours. Urinalysis    Component Value Date/Time   COLORURINE YELLOW 01/31/2021 0358   APPEARANCEUR CLEAR 01/31/2021 0358   LABSPEC 1.017 01/31/2021 0358   PHURINE 5.0 01/31/2021 0358   GLUCOSEU >=500 (A) 01/31/2021 0358   HGBUR MODERATE (A) 01/31/2021 0358   BILIRUBINUR NEGATIVE 01/31/2021 0358   KETONESUR 5  (A) 01/31/2021 0358   PROTEINUR NEGATIVE 01/31/2021 0358   UROBILINOGEN 0.2 03/31/2011 1813   NITRITE NEGATIVE 01/31/2021 0358   LEUKOCYTESUR NEGATIVE 01/31/2021 0358   Sepsis Labs Invalid input(s): PROCALCITONIN,  WBC,  LACTICIDVEN Microbiology Recent Results (from the past 240 hour(s))  Urine culture     Status: None   Collection Time: 01/26/21  3:27 PM   Specimen: Urine, Random  Result Value Ref Range Status   Specimen Description   Final    URINE, RANDOM Performed at [facility] [place], [address] [place], [place] [address]    Special Requests   Final    NONE Performed at [facility] [place], [address] [place], [place] [address]    Culture   Final    NO GROWTH Performed at [facility], [address] [place], [place] [address]    Report Status 01/28/2021 FINAL  Final  Resp Panel by RT-PCR (Flu A&B, Covid) Nasopharyngeal Swab     Status: None   Collection Time: 01/31/21  3:29 AM   Specimen: Nasopharyngeal Swab; Nasopharyngeal(NP) swabs in vial transport medium  Result Value Ref Range Status   SARS Coronavirus 2 by RT PCR NEGATIVE NEGATIVE Final    Comment: (NOTE) SARS-CoV-2 target nucleic acids are NOT DETECTED.  The SARS-CoV-2 RNA is generally detectable in upper respiratory specimens during the acute phase of infection. The lowest concentration of SARS-CoV-2 viral copies this assay can detect is 138 copies/mL. A negative result does not preclude SARS-Cov-2 infection and should not be used as the sole basis for treatment or other patient management decisions. A negative result may occur with  improper specimen collection/handling, submission of specimen other than nasopharyngeal swab, presence of viral mutation(s) within the areas targeted by this assay, and inadequate number of viral copies(<138 copies/mL). A negative result must be combined with clinical observations, patient history, and epidemiological information. The expected result  is Negative.  Fact Sheet for Patients:  BloggerCourse.comhttps://www.fda.gov/media/152166/download  Fact Sheet for Healthcare Providers:  SeriousBroker.ithttps://www.fda.gov/media/152162/download  This test is no t yet approved or cleared by the [place] FDA and  has been authorized for detection and/or diagnosis of SARS-CoV-2 by FDA under an Emergency Use Authorization (EUA). This EUA will remain  in effect (meaning this test can be used) for the duration of the COVID-19 declaration under Section 564(b)(1) of the Act, 21 U.S.C.section 360bbb-3(b)(1), unless  the authorization is terminated  or revoked sooner.       Influenza A by PCR NEGATIVE NEGATIVE Final   Influenza B by PCR NEGATIVE NEGATIVE Final    Comment: (NOTE) The Xpert Xpress SARS-CoV-2/FLU/RSV plus assay is intended as an aid in the diagnosis of influenza from Nasopharyngeal swab specimens and should not be used as a sole basis for treatment. Nasal washings and aspirates are unacceptable for Xpert Xpress SARS-CoV-2/FLU/RSV testing.  Fact Sheet for Patients: BloggerCourse.com  Fact Sheet for Healthcare Providers: SeriousBroker.it  This test is not yet approved or cleared by the [place] FDA and has been authorized for detection and/or diagnosis of SARS-CoV-2 by FDA under an Emergency Use Authorization (EUA). This EUA will remain in effect (meaning this test can be used) for the duration of the COVID-19 declaration under Section 564(b)(1) of the Act, 21 U.S.C. section 360bbb-3(b)(1), unless the authorization is terminated or revoked.  Performed at [facility], [address], [place], [place] [address]      Time coordinating discharge: 45 minutes  SIGNED:   Coralie Keens, MD  [facility] 02/01/2021, 9:44 AM

## 2021-02-03 ENCOUNTER — Encounter (HOSPITAL_COMMUNITY): Payer: Self-pay | Admitting: Urology

## 2021-02-03 NOTE — Anesthesia Postprocedure Evaluation (Signed)
Anesthesia Post Note  Patient: [name] Soto  Procedure(s) Performed: CYSTOSCOPY WITH RIGHT  RETROGRADE URETEROSCOPY HOLMIUM LASER AND STENT PLACEMENT (Right )     Patient location during evaluation: PACU Anesthesia Type: General Level of consciousness: awake and alert Pain management: pain level controlled Vital Signs Assessment: post-procedure vital signs reviewed and stable Respiratory status: spontaneous breathing, nonlabored ventilation, respiratory function stable and patient connected to nasal cannula oxygen Cardiovascular status: blood pressure returned to baseline and stable Postop Assessment: no apparent nausea or vomiting Anesthetic complications: no   No complications documented.  Last Vitals:  Vitals:   01/31/21 2319 02/01/21 0528  BP: 129/66 (!) 159/72  Pulse: (!) 59 (!) 52  Resp: 12   Temp: 36.9 C 36.9 C  SpO2: 95% 96%    Last Pain:  Vitals:   02/01/21 0744  TempSrc:   PainSc: 0-No pain                 Samayra Hebel L Treacy Holcomb

## 2021-02-03 NOTE — Op Note (Signed)
NAME: [name] Soto, [name] Soto MEDICAL RECORD NO: [id number] ACCOUNT NO: [id number] DATE OF BIRTH: [birth year] FACILITY: [facility] LOCATION: [facility] PHYSICIAN: [name], MD  Operative Report   DATE OF PROCEDURE: 01/31/2021  PREOPERATIVE DIAGNOSES:  Right ureteral stone, acute renal failure.  PROCEDURE PERFORMED: 1.  Cystoscopy with right retrograde pyelogram, interpretation. 2.  Right ureteroscopy with laser lithotripsy. 3.  Insertion of right ureteral stent, 5 x 26 Polaris, no tether.  ESTIMATED BLOOD LOSS:  Nil.  COMPLICATIONS:  None.  SPECIMEN:  Right ureteral stone fragments for composition analysis.  FINDINGS: 1.  Very impacted right distal ureteral stone. 2.  Complete resolution of all accessible stone fragments larger than one-third mm following laser lithotripsy and basket extraction. 3.  Successful placement of right ureteral stent, proximal end in the renal pelvis, distal end in the urinary bladder.  INDICATIONS FOR PROCEDURE:  The patient is a pleasant 75 year old man who was found on workup of colicky flank pain to have a right distal ureteral stone.  He was seen in the office earlier this week.  At that point in time, he did have some significant  colic, but was maintaining hydration and given the stone's location, counseled towards ureteroscopy at next available and he is scheduled for this today.  He had unfortunately progressive colic symptoms with significant dehydration.  He presented to the  emergency room last night and was found to be profoundly dehydrated with some acute renal failure.  No significant infectious parameters.  He was admitted and has undergone intravenous fluid hydration.  Options were discussed including recommend path of  proceeding with right ureteroscopy as the stone is the nidus of his symptomatology.  He wished to proceed.  Informed consent was obtained, placed in medical record  DESCRIPTION OF PROCEDURE:  The patient being verified, procedure being  right ureteroscopic stone manipulation was confirmed, procedure timeout was performed.  Intravenous antibiotics were administered.  General LMA anesthesia induced.  The patient was  placed into the low lithotomy position.  Sterile field was created.  Prepped and draped the patient's penis, perineum and proximal thigh using iodine.  Cystourethroscopy was performed using with a 21-French rigid cystoscope with offset lens.  Inspection  of anterior and posterior urethra was unremarkable.  Inspection of the bladder revealed no diverticula, calcifications, no papillary lesions.  The right ureteral orifice was cannulated with a 6-French open-ended catheter and a right retrograde pyelogram  was obtained.  Right retrograde pyelogram demonstrated single right ureter, single system right kidney.  There was mild amount of hydronephrosis to the very most distal ureter just above the intramural ureter consistent with stone.  A 0.038 ZIPwire was advanced to the  level of upper pole site, set aside as a safety wire.  An 8-French feeding tube placed in the urinary bladder for pressure release and semi-rigid ureteroscopy was performed in the distal ureter alongside separate sensor working wire.  As expected, there  was a right distal ureteral stone just above the intramural ureter.  This was quite impacted with significant mucosal edema.  This was much too large for simple basketing.  As such, holmium laser energy was applied to the stone using 0.2 joules and 20  Hz.  It was fragmented into approximately three smaller pieces, they were then sequentially grasped in their long axis and removed and set aside for composition analysis.  Following this, complete resolution of all accessible stone fragments within the  entire length of right ureter as verified by semirigid ureteroscopy.  Given the significant impaction  and mucosal edema, it was felt that interval stenting with nontethered stent would be most prudent.  As such, a  new 5 x 26 Polaris type stent was placed  over the remaining safety wire using fluoroscopic guidance.  Good proximal and distal planes were noted.  Procedure was terminated.  The patient tolerated the procedure well with no immediate complications.  The patient taken to postanesthesia care in  stable condition with continued inpatient evaluation and hydration to verify his acute renal failure trends better before discharge.   SHW D: 01/31/2021 1:30:59 pm T: 01/31/2021 9:11:00 pm  JOB: [id number]/ [id number]

## 2021-02-06 ENCOUNTER — Encounter: Payer: Medicare Other | Admitting: Physical Therapy

## 2021-07-27 ENCOUNTER — Encounter (HOSPITAL_BASED_OUTPATIENT_CLINIC_OR_DEPARTMENT_OTHER): Payer: Self-pay | Admitting: *Deleted

## 2021-07-27 ENCOUNTER — Emergency Department (HOSPITAL_BASED_OUTPATIENT_CLINIC_OR_DEPARTMENT_OTHER): Payer: Medicare Other

## 2021-07-27 ENCOUNTER — Other Ambulatory Visit: Payer: Self-pay

## 2021-07-27 ENCOUNTER — Emergency Department (HOSPITAL_BASED_OUTPATIENT_CLINIC_OR_DEPARTMENT_OTHER)
Admission: EM | Admit: 2021-07-27 | Discharge: 2021-07-27 | Disposition: A | Discharge status: Home / Self Care | Payer: Medicare Other | Attending: Emergency Medicine | Admitting: Emergency Medicine

## 2021-07-27 DIAGNOSIS — E782 Mixed hyperlipidemia: Secondary | ICD-10-CM | POA: Insufficient documentation

## 2021-07-27 DIAGNOSIS — Z7982 Long term (current) use of aspirin: Secondary | ICD-10-CM | POA: Insufficient documentation

## 2021-07-27 DIAGNOSIS — Z96611 Presence of right artificial shoulder joint: Secondary | ICD-10-CM | POA: Diagnosis not present

## 2021-07-27 DIAGNOSIS — R519 Headache, unspecified: Secondary | ICD-10-CM

## 2021-07-27 DIAGNOSIS — E1169 Type 2 diabetes mellitus with other specified complication: Secondary | ICD-10-CM | POA: Insufficient documentation

## 2021-07-27 DIAGNOSIS — I6523 Occlusion and stenosis of bilateral carotid arteries: Secondary | ICD-10-CM | POA: Insufficient documentation

## 2021-07-27 DIAGNOSIS — I6602 Occlusion and stenosis of left middle cerebral artery: Secondary | ICD-10-CM | POA: Diagnosis not present

## 2021-07-27 DIAGNOSIS — I440 Atrioventricular block, first degree: Secondary | ICD-10-CM | POA: Insufficient documentation

## 2021-07-27 DIAGNOSIS — Z7984 Long term (current) use of oral hypoglycemic drugs: Secondary | ICD-10-CM | POA: Diagnosis not present

## 2021-07-27 DIAGNOSIS — I6623 Occlusion and stenosis of bilateral posterior cerebral arteries: Secondary | ICD-10-CM | POA: Insufficient documentation

## 2021-07-27 DIAGNOSIS — I10 Essential (primary) hypertension: Secondary | ICD-10-CM | POA: Diagnosis not present

## 2021-07-27 DIAGNOSIS — G459 Transient cerebral ischemic attack, unspecified: Secondary | ICD-10-CM

## 2021-07-27 DIAGNOSIS — Z79899 Other long term (current) drug therapy: Secondary | ICD-10-CM | POA: Diagnosis not present

## 2021-07-27 DIAGNOSIS — Z87891 Personal history of nicotine dependence: Secondary | ICD-10-CM | POA: Diagnosis not present

## 2021-07-27 DIAGNOSIS — I6503 Occlusion and stenosis of bilateral vertebral arteries: Secondary | ICD-10-CM | POA: Diagnosis not present

## 2021-07-27 DIAGNOSIS — R29898 Other symptoms and signs involving the musculoskeletal system: Secondary | ICD-10-CM

## 2021-07-27 DIAGNOSIS — Z20822 Contact with and (suspected) exposure to covid-19: Secondary | ICD-10-CM | POA: Insufficient documentation

## 2021-07-27 DIAGNOSIS — I651 Occlusion and stenosis of basilar artery: Secondary | ICD-10-CM | POA: Diagnosis not present

## 2021-07-27 LAB — CBC
HCT: 38.2 % — ABNORMAL LOW (ref 39.0–52.0)
Hemoglobin: 12.6 g/dL — ABNORMAL LOW (ref 13.0–17.0)
MCH: 29.4 pg (ref 26.0–34.0)
MCHC: 33 g/dL (ref 30.0–36.0)
MCV: 89.3 fL (ref 80.0–100.0)
Platelets: 163 10*3/uL (ref 150–400)
RBC: 4.28 MIL/uL (ref 4.22–5.81)
RDW: 14.6 % (ref 11.5–15.5)
WBC: 5.7 10*3/uL (ref 4.0–10.5)
nRBC: 0 % (ref 0.0–0.2)

## 2021-07-27 LAB — COMPREHENSIVE METABOLIC PANEL
ALT: 20 U/L (ref 0–44)
AST: 18 U/L (ref 15–41)
Albumin: 3.8 g/dL (ref 3.5–5.0)
Alkaline Phosphatase: 64 U/L (ref 38–126)
Anion gap: 5 (ref 5–15)
BUN: 24 mg/dL — ABNORMAL HIGH (ref 8–23)
CO2: 28 mmol/L (ref 22–32)
Calcium: 8.9 mg/dL (ref 8.9–10.3)
Chloride: 107 mmol/L (ref 98–111)
Creatinine, Ser: 1.04 mg/dL (ref 0.61–1.24)
GFR, Estimated: >60 mL/min (ref >60)
Glucose, Bld: 182 mg/dL — ABNORMAL HIGH (ref 70–99)
Potassium: 3.9 mmol/L (ref 3.5–5.1)
Sodium: 140 mmol/L (ref 135–145)
Total Bilirubin: 0.4 mg/dL (ref 0.3–1.2)
Total Protein: 7 g/dL (ref 6.5–8.1)

## 2021-07-27 LAB — DIFFERENTIAL
Abs Immature Granulocytes: 0.01 10*3/uL (ref 0.00–0.07)
Basophils Absolute: 0 10*3/uL (ref 0.0–0.1)
Basophils Relative: 1 %
Eosinophils Absolute: 0.1 10*3/uL (ref 0.0–0.5)
Eosinophils Relative: 2 %
Immature Granulocytes: 0 %
Lymphocytes Relative: 20 %
Lymphs Abs: 1.1 10*3/uL (ref 0.7–4.0)
Monocytes Absolute: 0.5 10*3/uL (ref 0.1–1.0)
Monocytes Relative: 8 %
Neutro Abs: 4 10*3/uL (ref 1.7–7.7)
Neutrophils Relative %: 69 %

## 2021-07-27 LAB — URINALYSIS, ROUTINE W REFLEX MICROSCOPIC
Bilirubin Urine: NEGATIVE
Glucose, UA: >=500 mg/dL — AB
Hgb urine dipstick: NEGATIVE
Ketones, ur: NEGATIVE mg/dL
Leukocytes,Ua: NEGATIVE
Nitrite: NEGATIVE
Protein, ur: 30 mg/dL — AB
Specific Gravity, Urine: 1.025 (ref 1.005–1.030)
pH: 6 (ref 5.0–8.0)

## 2021-07-27 LAB — RAPID URINE DRUG SCREEN, HOSP PERFORMED
Amphetamines: NOT DETECTED
Barbiturates: NOT DETECTED
Benzodiazepines: NOT DETECTED
Cocaine: NOT DETECTED
Opiates: NOT DETECTED
Tetrahydrocannabinol: NOT DETECTED

## 2021-07-27 LAB — URINALYSIS, MICROSCOPIC (REFLEX)

## 2021-07-27 LAB — RESP PANEL BY RT-PCR (FLU A&B, COVID) ARPGX2
Influenza A by PCR: NEGATIVE
Influenza B by PCR: NEGATIVE
SARS Coronavirus 2 by RT PCR: NEGATIVE

## 2021-07-27 LAB — ETHANOL: Alcohol, Ethyl (B): <10 mg/dL (ref <10)

## 2021-07-27 LAB — APTT: aPTT: 33 seconds (ref 24–36)

## 2021-07-27 LAB — CBG MONITORING, ED: Glucose-Capillary: 211 mg/dL — ABNORMAL HIGH (ref 70–99)

## 2021-07-27 LAB — PROTIME-INR
INR: 1 (ref 0.8–1.2)
Prothrombin Time: 13 seconds (ref 11.4–15.2)

## 2021-07-27 MED ORDER — IOHEXOL 350 MG/ML SOLN
100.0000 mL | Freq: Once | INTRAVENOUS | Status: AC | PRN
  Administered 2021-07-27: 100 mL via INTRAVENOUS

## 2021-07-27 MED ORDER — ASPIRIN 81 MG PO TABS: 81.0000 mg | ORAL_TABLET | Freq: Every day | ORAL | 0 refills | Status: DC

## 2021-07-27 MED ORDER — CLOPIDOGREL BISULFATE 75 MG PO TABS
75.0000 mg | ORAL_TABLET | Freq: Every day | ORAL | Status: DC
  Administered 2021-07-27: 75 mg via ORAL
  Filled 2021-07-27: qty 1

## 2021-07-27 MED ORDER — DIPHENHYDRAMINE HCL 50 MG/ML IJ SOLN: 25.0000 mg | Freq: Once | INTRAMUSCULAR | Status: DC

## 2021-07-27 MED ORDER — METOCLOPRAMIDE HCL 5 MG/ML IJ SOLN: 10.0000 mg | Freq: Once | INTRAMUSCULAR | Status: DC

## 2021-07-27 MED ORDER — CLOPIDOGREL BISULFATE 75 MG PO TABS: 75.0000 mg | ORAL_TABLET | Freq: Every day | ORAL | 0 refills | Status: AC

## 2021-07-27 NOTE — ED Notes (Signed)
Dr. Donnald Garre assessing pt in triage

## 2021-07-27 NOTE — ED Provider Notes (Signed)
Emergency Medicine Provider Triage Evaluation Note  [name] Soto , a 76 y.o. male  was evaluated in triage.  Pt complains of sudden headache at 145. patient has history of prior stroke with left-sided deficit.  However at baseline now he has good function.  He was out grocery shopping with his wife.  He was holding onto a handle to the door.  He suddenly complained of a very bad headache and gripped onto the handle and did not seem to be able to let go.  Once his past, he was able to complete the shopping with his wife and they went home and unloaded groceries and then came to the emergency department for evaluation.  No new focal deficits were identified by patient or family.  Review of Systems  Positive: Sudden headache Negative: No new confusion or visual changes.  Physical Exam  BP 124/62 (BP Location: Left Arm)   Pulse 69   Temp (!) 97.4 F (36.3 C) (Oral)   Resp 18   Ht 5\' 10"  (1.778 m)   Wt 90.3 kg   SpO2 100%   BMI 28.55 kg/m  Gen:   Awake, no distress   Resp:  Normal effort  MSK:   Moves extremities without difficulty  Other:  Symmetric facial movements cranial nerves intact.  Grip strength symmetric.  Patient can extend both lower extremities and hold against resistance.  No visual field deficits.  Medical Decision Making  Medically screening exam initiated at 2:55 PM.  Appropriate orders placed.  was informed that the remainder of the evaluation will be completed by another provider, this initial triage assessment does not replace that evaluation, and the importance of remaining in the ED until their evaluation is complete.  Patient presents as outlined.  At this time he does not have new objective neurologic deficits.  Proceed with CT head and diagnostic evaluation.  At this time no indication for code stroke.   Wonda Olds, MD 07/27/21 1501

## 2021-07-27 NOTE — ED Triage Notes (Addendum)
Pt 's wife reports while in grocery store pt got a sudden right side headache. States he gripped the handle of the freezer door at that time "and couldn't let go". This happened around 1345. Pt has hx of CVA with residual left side weakness. Pt alert in triage, facial symmetry present, left grip slightly weaker, no drift noted. Pt reports HA is improving. Cao x 4. Pt's wife voices concern for CVA and states "the doctor told me not to ignore any symptoms". Dr. Donnald Garre made aware.

## 2021-07-27 NOTE — Discharge Instructions (Addendum)
You likely have either a complex migraine or a small stroke  Neurology recommend that you take baby aspirin daily and also take Plavix 75 mg daily for 21 days  You need to call neurology tomorrow to get appointment this week for further work-up.  Your CT today showed multiple areas of stenosis that can potentially cause strokes  Return to ER if you have severe headache, trouble speaking, weakness, numbness

## 2021-07-27 NOTE — Progress Notes (Signed)
Neurology Telephone Note  Contacted by Dr. Silverio Lay to discuss this patient with remote hx R BG ischemic lacunar infarct 2012, DM2, HTN who presented after transient episode of acute worsening of his chronic L sided weakness. It was accompanied by a headache, decreased responsiveness to questions, and being unable to let go of a handle with his LUE. CT head NAICP, global atrophy advanced for age, remote R BG lacunar infarct. Stroke code not activated 2/2 no current neurologic sx beyond baseline.  I recommended the following:  - Admit to [facility] hospitalist service for stroke w/u; page [facility] neurohospitalist on arrival.  - Permissive HTN x48 hrs from sx onset or until stroke ruled out by MRI goal BP <220/110. PRN labetalol or hydralazine if BP above these parameters. Avoid oral antihypertensives. - MRI brain wo contrast - CTA or MRA H&N - TTE - rEEG given potential decreased responsiveness during incident - Check A1c and LDL + add statin per guidelines - ASA 81mg  daily + plavix 75mg  daily x21 days f/b plavix 75mg  daily monotherapy after that - q4 hr neuro checks - STAT head CT for any change in neuro exam - Tele - PT/OT/SLP - Stroke education - Amb referral to neurology upon discharge  , MD [facility] [phone]  If 7pm- 7am, please page neurology on call as listed in AMION.

## 2021-07-27 NOTE — ED Provider Notes (Addendum)
[facility] EMERGENCY DEPARTMENT Provider Note   CSN: [id number] Arrival date & time: 07/27/21  1426     History Chief Complaint  Patient presents with   Headache    [name] Soto is a 75 y.o. male hx of DM, previous stroke (2012, residual L sided weakness), HTN, here with headache, L arm stiffness. Patient was at the grocery store and had acute onset of headache and left sided weakness. He was gripping onto a handle of the fridge and then couldn't let go and had headache. His speech was slow but there was no slurred speech. Headache now resolved and states that he has no further weakness. His last stroke was 2012. He is not on blood thinners.   The history is provided by the patient.      Past Medical History:  Diagnosis Date   At risk for falls    Balance problem    Cerebral thrombosis with cerebral infarction (HCC)    Diabetes mellitus without complication (HCC)    GERD (gastroesophageal reflux disease)    History of kidney stones    Humerus fracture    PROXIMAL RIGHT   Hypertension    Nausea and vomiting    Pancreatic lesion    PONV (postoperative nausea and vomiting)    Stroke [facility]) 2012    Patient Active Problem List   Diagnosis Date Noted   Acute kidney injury (HCC) 01/31/2021   Right ureteral stone 01/31/2021   Essential hypertension 01/31/2021   GERD without esophagitis 01/31/2021   Type 2 diabetes mellitus with complication, without long-term current use of insulin (HCC) 01/31/2021   Mixed diabetic hyperlipidemia associated with type 2 diabetes mellitus (HCC) 01/31/2021   Acute renal failure (ARF) (HCC) 01/31/2021   S/P ORIF (open reduction internal fixation) fracture 09/26/2020   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    Past Surgical History:  Procedure Laterality Date   CATARACT EXTRACTION Bilateral    CYSTOSCOPY WITH RETROGRADE PYELOGRAM, URETEROSCOPY AND STENT PLACEMENT Right 01/31/2021   Procedure: CYSTOSCOPY WITH RIGHT   RETROGRADE URETEROSCOPY HOLMIUM LASER AND STENT PLACEMENT;  Surgeon: [name], MD;  Location: [facility];  Service: Urology;  Laterality: Right;   EUS N/A 07/17/2015   Procedure: ESOPHAGEAL ENDOSCOPIC ULTRASOUND (EUS) RADIAL;  Surgeon: Willis Modena, MD;  Location: [facility];  Service: Endoscopy;  Laterality: N/A;   FINE NEEDLE ASPIRATION N/A 07/17/2015   Procedure: FINE NEEDLE ASPIRATION (FNA) RADIAL;  Surgeon: Willis Modena, MD;  Location: [facility];  Service: Endoscopy;  Laterality: N/A;   HEMATOMA DRAINED     HERNIA REPAIR     inguinal right   HYDROCELE EXCISION     ORIF HUMERUS FRACTURE Right 09/26/2020   Procedure: Open reduction internal fixation right humeral periprosthetic fracture;  Surgeon: Francena Hanly, MD;  Location: [facility];  Service: Orthopedics;  Laterality: Right;    REVERSE SHOULDER ARTHROPLASTY Right 05/07/2016   Procedure: RIGHT REVERSE TOTAL SHOULDER ARTHROPLASTY;  Surgeon: Francena Hanly, MD;  Location: [facility];  Service: Orthopedics;  Laterality: Right;       Family History  Problem Relation Age of Onset   Heart disease Neg Hx     Social History   Tobacco Use   Smoking status: Former    Types: Cigarettes   Smokeless tobacco: Never   Tobacco comments:    QUIT SMOKING IN THE 70'S  Vaping Use   Vaping Use: Never used  Substance Use Topics   Alcohol use: No   Drug use: Not Currently  Home Medications Prior to Admission medications   Medication Sig Start Date End Date Taking? Authorizing Provider  acetaminophen (TYLENOL) 325 MG tablet Take 2 tablets (650 mg total) by mouth every 6 (six) hours as needed for moderate pain. 02/01/21   Arrien, York Ram, MD  amLODipine (NORVASC) 10 MG tablet Take 10 mg by mouth every morning.    [provider]  aspirin 81 MG tablet Take 81 mg by mouth daily.    [provider]  atorvastatin (LIPITOR) 40 MG tablet Take 40 mg by mouth at bedtime.    [provider]  Cholecalciferol  (VITAMIN D3 PO) Take 500 mg by mouth daily.    [provider]  HYDROcodone-acetaminophen (NORCO/VICODIN) 5-325 MG tablet Take 1-2 tablets by mouth every 4 (four) hours as needed. Patient taking differently: Take 1-2 tablets by mouth every 4 (four) hours as needed for moderate pain or severe pain. 01/26/21   Rolan Bucco, MD  lisinopril (PRINIVIL,ZESTRIL) 40 MG tablet Take 40 mg by mouth daily.    [provider]  metFORMIN (GLUCOPHAGE) 500 MG tablet Take 500 mg by mouth 2 (two) times daily with a meal. 07/31/20   [provider]  Multiple Vitamin (MULTI-VITAMINS) TABS Take 1 tablet by mouth daily.    [provider]  ondansetron (ZOFRAN-ODT) 8 MG disintegrating tablet Take 8 mg by mouth every 8 (eight) hours as needed for nausea or vomiting.    [provider]  Polyethyl Glycol-Propyl Glycol (SYSTANE) 0.4-0.3 % SOLN Place 1 drop into both eyes daily as needed (Dry eyes).    [provider]  promethazine (PHENERGAN) 25 MG tablet Take 25 mg by mouth every 6 (six) hours as needed for nausea or vomiting.    [provider]  tamsulosin (FLOMAX) 0.4 MG CAPS capsule Take 1 capsule (0.4 mg total) by mouth daily. 01/26/21   Rolan Bucco, MD  zinc gluconate 50 MG tablet Take 50 mg by mouth daily.    [provider]    Allergies    Metformin  Review of Systems   Review of Systems  Neurological:  Positive for headaches.  All other systems reviewed and are negative.  Physical Exam Updated Vital Signs BP (!) 151/127   Pulse 71   Temp (!) 97.4 F (36.3 C) (Oral)   Resp 18   Ht 5\' 10"  (1.778 m)   Wt 90.3 kg   SpO2 100%   BMI 28.55 kg/m   Physical Exam Vitals and nursing note reviewed.  Constitutional:      Comments: Chronically ill, NAD   HENT:     Head: Normocephalic.  Eyes:     Extraocular Movements: Extraocular movements intact.     Pupils: Pupils are equal, round, and reactive to light.  Cardiovascular:      Rate and Rhythm: Normal rate and regular rhythm.     Heart sounds: Normal heart sounds.  Pulmonary:     Effort: Pulmonary effort is normal.     Breath sounds: Normal breath sounds.  Abdominal:     General: Bowel sounds are normal.     Palpations: Abdomen is soft.  Musculoskeletal:        General: Normal range of motion.     Cervical back: Normal range of motion and neck supple.  Skin:    General: Skin is warm.  Neurological:     Mental Status: He is alert and oriented to person, place, and time.     Comments: No obvious facial droop. Strength  4/5 L arm (chronic per patient), 5/5 R arm and leg and L leg. Nl sensation bilaterally. No pronator drift. Nl finger to nose bilaterally   Psychiatric:        Mood and Affect: Mood normal.        Behavior: Behavior normal.    ED Results / Procedures / Treatments   Labs (all labs ordered are listed, but only abnormal results are displayed) Labs Reviewed  CBC - Abnormal; Notable for the following components:      Result Value   Hemoglobin 12.6 (*)    HCT 38.2 (*)    All other components within normal limits  COMPREHENSIVE METABOLIC PANEL - Abnormal; Notable for the following components:   Glucose, Bld 182 (*)    BUN 24 (*)    All other components within normal limits  URINALYSIS, ROUTINE W REFLEX MICROSCOPIC - Abnormal; Notable for the following components:   Glucose, UA >=500 (*)    Protein, ur 30 (*)    All other components within normal limits  URINALYSIS, MICROSCOPIC (REFLEX) - Abnormal; Notable for the following components:   Bacteria, UA FEW (*)    All other components within normal limits  CBG MONITORING, ED - Abnormal; Notable for the following components:   Glucose-Capillary 211 (*)    All other components within normal limits  RESP PANEL BY RT-PCR (FLU A&B, COVID) ARPGX2  ETHANOL  PROTIME-INR  APTT  DIFFERENTIAL  RAPID URINE DRUG SCREEN, HOSP PERFORMED    EKG EKG Interpretation  Date/Time:  Sunday July 27 2021  14:44:27 EST Ventricular Rate:  64 PR Interval:  212 QRS Duration: 96 QT Interval:  402 QTC Calculation: 414 R Axis:   147 Text Interpretation: Sinus rhythm with 1st degree A-V block Right axis deviation Cannot rule out Anterior infarct , age undetermined Abnormal ECG No significant change since last tracing Confirmed by Richardean Canal [phone]) on 07/27/2021 3:22:05 PM  Radiology CT ANGIO HEAD NECK W WO CM  Result Date: 07/27/2021 CLINICAL DATA:  Headache, classic migraine EXAM: CT ANGIOGRAPHY HEAD AND NECK TECHNIQUE: Multidetector CT imaging of the head and neck was performed using the standard protocol during bolus administration of intravenous contrast. Multiplanar CT image reconstructions and MIPs were obtained to evaluate the vascular anatomy. Carotid stenosis measurements (when applicable) are obtained utilizing NASCET criteria, using the distal internal carotid diameter as the denominator. CONTRAST:  OMNIPAQUE IOHEXOL 350 MG/ML SOLN COMPARISON:  Same day CT head. FINDINGS: CTA NECK FINDINGS Aortic arch: Atherosclerosis.  Great vessel origins are patent. Right carotid system: Mild-to-moderate atherosclerotic narrowing of the common carotid artery. Mixed calcific and noncalcific atherosclerosis at the carotid bifurcation with approximately 40% stenosis of the proximal ICA. Tortuous ICA at the skull base. Left carotid system: Poor visualization of the proximal common carotid artery due to streak artifact. Mild stenosis of the mid common carotid artery due to atherosclerosis. Mixed calcific and noncalcific atherosclerosis at the carotid bifurcation without greater than 50% stenosis. Tortuous ICA at the skull base. Vertebral arteries: Left dominant right vertebral artery is patent without significant (greater than 50%) stenosis in the neck. Mild-to-moderate stenosis of the proximal dominant left intradural vertebral artery, which is patent. Skeleton: Mild multilevel degenerative change of the  cervical spine. Other neck: No acute abnormality. Upper chest: Visualized lung apices are clear. Review of the MIP images confirms the above findings CTA HEAD FINDINGS Anterior circulation: Calcific atherosclerosis of bilateral intracranial carotid arteries with moderate bilateral paraclinoid ICA stenosis. Bilateral MCAs are patent. Moderate  stenosis of the distal left M1 MCA. Small right A1 ACA. Bilateral ACAs are patent without proximal hemodynamically significant stenosis. Posterior circulation: Moderate bilateral proximal intradural vertebral artery stenosis. Bilateral intradural vertebral arteries are patent. Basilar artery is patent with mild to moderate stenosis. Bilateral posterior cerebral arteries are patent. Moderate right and mild left P2 PCA stenosis. Venous sinuses: As permitted by contrast timing, patent. Review of the MIP images confirms the above findings IMPRESSION: CTA head: 1. No large vessel occlusion 2. Moderate distal left M1 MCA stenosis. 3. Moderate bilateral paraclinoid ICA stenosis. 4. Moderate bilateral proximal intradural vertebral artery stenosis. 5. Moderate right and mild left P2 PCA stenosis. 6. 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 CTA neck: 1. Right greater than left carotid bifurcation atherosclerosis with approximately 40% stenosis of the proximal right ICA. 2. Mild to moderate right common carotid artery and proximal left vertebral artery stenosis. Electronically Signed   By: Feliberto Harts M.D.   On: 07/27/2021 17:49   CT HEAD WO CONTRAST  Result Date: 07/27/2021 CLINICAL DATA:  Sudden onset right-sided headache. EXAM: CT HEAD WITHOUT CONTRAST TECHNIQUE: Contiguous axial images were obtained from the base of the skull through the vertex without intravenous contrast. COMPARISON:  None. FINDINGS: Brain: No evidence of acute infarction, hemorrhage, hydrocephalus, extra-axial collection or mass lesion/mass effect. Old lacunar infarct in the right basal ganglia.  Mild-to-moderate generalized cerebral atrophy. Scattered moderate periventricular and subcortical white matter hypodensities are nonspecific, but favored to reflect chronic microvascular ischemic changes. Vascular: Calcified atherosclerosis at the skull base. No hyperdense vessel. Skull: Normal. Negative for fracture or focal lesion. Sinuses/Orbits: No acute finding. Other: None. IMPRESSION: 1. No acute intracranial abnormality. 2. Moderate chronic microvascular ischemic changes and cerebral atrophy. Electronically Signed   By: Obie Dredge M.D.   On: 07/27/2021 15:29    Procedures Procedures   Medications Ordered in ED Medications  clopidogrel (PLAVIX) tablet 75 mg (75 mg Oral Given 07/27/21 1618)  iohexol (OMNIPAQUE) 350 MG/ML injection 100 mL (100 mLs Intravenous Contrast Given 07/27/21 1649)    ED Course  I have reviewed the triage vital signs and the nursing notes.  Pertinent labs & imaging results that were available during my care of the patient were reviewed by me and considered in my medical decision making (see chart for details).    MDM Rules/Calculators/A&P                           ANITA MCADORY is a 75 y.o. male here presenting with headache and left arm weakness.  He is back to baseline now.  He does not meet criteria for tPA.  I consulted Dr. Selina Cooley from neurology.  She recommend admission to the hospitalist and MRI and TIA work-up.  We will get CT head and CT angio head and neck in the ED. Consider TIA versus small stroke versus complex migraine  6:35 PM Patient is back to baseline now.  His CTA does showed moderate left M1 stenosis.  Patient has 40% stenosis of the proximal right ICA.  I discussed with patient regarding neurologist's recommendations including admission and Plavix for 21 days and aspirin.  He is very adamant that he wants to go home and rather get the work-up outpatient.  We will start him on Plavix as per recommendation.  We will have him call neurology  tomorrow for follow-up this week.  I told him that if he has recurrent weakness or trouble speaking when he concerned  for stroke, he needs to go to the nearest ER   Final Clinical Impression(s) / ED Diagnoses Final diagnoses:  None    Rx / DC Orders ED Discharge Orders     None        Charlynne Pander, MD 07/27/21 1836    Charlynne Pander, MD 07/27/21 1836

## 2021-07-27 NOTE — Progress Notes (Addendum)
Neurology Telephone Note  Contacted by Dr. Silverio Lay to discuss this patient with remote hx R BG ischemic lacunar infarct 2012, DM2, HTN who presented after transient episode of acute worsening of his chronic L sided weakness. It was accompanied by a headache, decreased responsiveness to questions, and being unable to let go of a handle with his LUE. CT head NAICP, global atrophy advanced for age, remote R BG lacunar infarct. Stroke code not activated 2/2 no current neurologic sx beyond baseline.  I recommended the following:  - Admit to [facility] hospitalist service for stroke w/u; page [facility] neurohospitalist on arrival.  - Permissive HTN x48 hrs from sx onset or until stroke ruled out by MRI goal BP <220/110. PRN labetalol or hydralazine if BP above these parameters. Avoid oral antihypertensives. - MRI brain wo contrast - CTA or MRA H&N - TTE - rEEG given potential decreased responsiveness during incident - Check A1c and LDL + add statin per guidelines - ASA 81mg  daily + plavix 75mg  daily x21 days f/b ASA 81mg  daily monotherapy after that - q4 hr neuro checks - STAT head CT for any change in neuro exam - Tele - PT/OT/SLP - Stroke education - Amb referral to neurology upon discharge  , MD [facility] [phone]  If 7pm- 7am, please page neurology on call as listed in AMION.

## 2021-07-27 NOTE — ED Notes (Signed)
Attempt at PIV insertion unsuccessful x2 attempts.  Nurse to attempt with u/s

## 2021-11-26 ENCOUNTER — Other Ambulatory Visit: Payer: Self-pay

## 2021-11-26 ENCOUNTER — Ambulatory Visit: Payer: Medicare Other | Attending: Neurology | Admitting: Physical Therapy

## 2021-11-26 ENCOUNTER — Encounter: Payer: Self-pay | Admitting: Physical Therapy

## 2021-11-26 DIAGNOSIS — R2681 Unsteadiness on feet: Secondary | ICD-10-CM | POA: Insufficient documentation

## 2021-11-26 DIAGNOSIS — R2689 Other abnormalities of gait and mobility: Secondary | ICD-10-CM | POA: Diagnosis present

## 2021-11-26 DIAGNOSIS — M25611 Stiffness of right shoulder, not elsewhere classified: Secondary | ICD-10-CM | POA: Diagnosis present

## 2021-11-26 DIAGNOSIS — M6281 Muscle weakness (generalized): Secondary | ICD-10-CM | POA: Diagnosis present

## 2021-11-26 DIAGNOSIS — Z9181 History of falling: Secondary | ICD-10-CM | POA: Diagnosis present

## 2021-11-26 NOTE — Therapy (Signed)
?[facility] ?[address] ?[place], [place], [address] ?Phone: [phone]   Fax:  [phone] ? ?Physical Therapy Evaluation ? ?Patient Details  ?Name: [name] Soto ?MRN: [id number] ?Date of Birth: 02-10-1946 ?Referring Provider (PT): Karl Luke, MD ? ? ?Encounter Date: 11/26/2021 ? ? PT End of Session - 11/26/21 0847   ? ? Visit Number 1   ? Number of Visits 12   ? Date for PT Re-Evaluation 01/07/22   ? Authorization Type Medicare & BCBS   ? PT Start Time [phone]   ? PT Stop Time [phone]   ? PT Time Calculation (min) 50 min   ? Activity Tolerance Patient tolerated treatment well   ? Behavior During Therapy WFL for tasks assessed/performed;Flat affect   ? ?  ?  ? ?  ? ? ?Past Medical History:  ?Diagnosis Date  ? At risk for falls   ? Balance problem   ? Cerebral thrombosis with cerebral infarction [facility])   ? Diabetes mellitus without complication ([place])   ? GERD (gastroesophageal reflux disease)   ? History of kidney stones   ? Humerus fracture   ? PROXIMAL RIGHT  ? Hypertension   ? Nausea and vomiting   ? Pancreatic lesion   ? PONV (postoperative nausea and vomiting)   ? Stroke [facility]) 2012  ? ? ?Past Surgical History:  ?Procedure Laterality Date  ? CATARACT EXTRACTION Bilateral   ? CYSTOSCOPY WITH RETROGRADE PYELOGRAM, URETEROSCOPY AND STENT PLACEMENT Right 01/31/2021  ? Procedure: CYSTOSCOPY WITH RIGHT  RETROGRADE URETEROSCOPY HOLMIUM LASER AND STENT PLACEMENT;  Surgeon: Alexis Frock, MD;  Location: [facility];  Service: Urology;  Laterality: Right;  ? EUS N/A 07/17/2015  ? Procedure: ESOPHAGEAL ENDOSCOPIC ULTRASOUND (EUS) RADIAL;  Surgeon: Arta Silence, MD;  [facility];  Service: Endoscopy;  Laterality: N/A;  ? FINE NEEDLE ASPIRATION N/A 07/17/2015  ? Procedure: FINE NEEDLE ASPIRATION (FNA) RADIAL;  Surgeon: Arta Silence, MD;  [facility];  Service: Endoscopy;  Laterality: N/A;  ? HEMATOMA DRAINED    ? HERNIA REPAIR    ? inguinal  right  ? HYDROCELE EXCISION    ? ORIF HUMERUS FRACTURE Right 09/26/2020  ? Procedure: Open reduction internal fixation right humeral periprosthetic fracture;  Surgeon: Justice Britain, MD;  [facility];  Service: Orthopedics;  Laterality: Right;  161min  ? REVERSE SHOULDER ARTHROPLASTY Right 05/07/2016  ? Procedure: RIGHT REVERSE TOTAL SHOULDER ARTHROPLASTY;  Surgeon: Justice Britain, MD;  Location: [place];  Service: Orthopedics;  Laterality: Right;  ? ? ?There were no vitals filed for this visit. ? ? ? Subjective Assessment - 11/26/21 0900   ? ? Subjective Wife reports his balance is not what it used to be. He has had multiple falls in the past few years but none in the past 6 months. Falls have resulted in injuries to his R shoulder resulting in R reverse TSA in 2017 and periprosthetic fracture of R arm in 2022. Remote h/o CVA ~10 yrs ago. Pt uses cane to ambulate and expresses strong aversion to using a walker, but wife reports tendency to furniture surf. He reports soreness in his toes from hammer toes which likely adds to his imbalance.   ? 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 Limitations House hold activities;Walking;Standing   ? Patient Stated Goals "  to be able to walk w/o a cane"   ? Currently in Pain? No/denies   ? ?  ?  ? ?  ? ? ? ? ? [facility] PT Assessment - 11/26/21 0847   ? ?  ? Assessment  ? Medical Diagnosis Imbalance   ? Referring Provider (PT) Karl Luke, MD   ? Onset Date/Surgical Date --   progressively worseneing over past few years  ? Hand Dominance Right   ? Next MD Visit 02/14/22   ? Prior Therapy yes - for R reverse TSA, periprosthetic fx, and s/p stroke   ?  ? Precautions  ? Precautions Fall   ?  ? Restrictions  ? Weight Bearing Restrictions No   ?  ? Balance Screen  ? Has the patient fallen in the past 6 months No   ? Has the patient had a decrease in  activity level because of a fear of falling?  No   pt denies, but wife reports decrease in activity  ? Is the patient reluctant to leave their home because of a fear of falling?  No   ?  ? Home Environment  ? Living Environment Private residence   ? Living Arrangements Spouse/significant other   ? Available Help at Discharge Family   ? Type of Home House   ? Home Access Stairs to enter   ? Entrance Stairs-Number of Steps 3   ? Entrance Stairs-Rails None   2 columns available to hold onto  ? Home Layout One level   ? [place] - single point;Walker - 2 wheels;Shower seat   ?  ? Prior Function  ? Level of Independence Independent   ? Vocation Retired   ? Leisure mostly sedentary   ?  ? Cognition  ? Overall Cognitive Status History of cognitive impairments - at baseline   ?  ? Sensation  ? Light Touch Appears Intact   ?  ? Coordination  ? Gross Motor Movements are Fluid and Coordinated Yes   ?  ? Posture/Postural Control  ? Posture/Postural Control Postural limitations   ? Postural Limitations Forward head;Flexed trunk   ?  ? ROM / Strength  ? AROM / PROM / Strength Strength   ?  ? Strength  ? Overall Strength Comments tested in sitting   ? Right Hip Flexion 4/5   ? Right Hip Extension 4-/5   ? Right Hip External Rotation  4-/5   ? Right Hip Internal Rotation 4/5   ? Right Hip ABduction 4/5   ? Right Hip ADduction 4/5   ? Left Hip Flexion 4/5   ? Left Hip Extension 4-/5   ? Left Hip External Rotation 4-/5   ? Left Hip Internal Rotation 4/5   ? Left Hip ABduction 4/5   ? Left Hip ADduction 4/5   ? Right Knee Flexion 4-/5   ? Right Knee Extension 4/5   ? Left Knee Flexion 4-/5   ? Left Knee Extension 4/5   ? Right Ankle Dorsiflexion 4+/5   ? Left Ankle Dorsiflexion 4+/5   ?  ? Ambulation/Gait  ? Ambulation/Gait Yes   ? Ambulation/Gait Assistance 5: Supervision   ? Assistive device Straight cane   ? Gait Pattern Wide base of support;Decreased dorsiflexion - right;Decreased dorsiflexion - left;Decreased hip/knee  flexion - right;Decreased hip/knee flexion - left;Poor foot clearance - right;Decreased stride length;Poor foot clearance - left;Right foot flat;Left foot flat   ? Ambulation Surface Level;Indoor   ? Gait velocity 1.30  ft/sec   ? Gait Comments R toes intermittently hitting SPC, absent heel strike with min to no heel-toe progression or toe-off   ?  ? Standardized Balance Assessment  ? Standardized Balance Assessment Berg Balance Test;Timed Up and Go Test;Five Times Sit to Stand;10 meter walk test   ? Five times sit to stand comments  20.03 sec w/o UE assist   ? 10 Meter Walk 25.16 sec with SPC   ?  ? Berg Balance Test  ? Sit to Stand Able to stand without using hands and stabilize independently   ? Standing Unsupported Able to stand 2 minutes with supervision   ? Sitting with Back Unsupported but Feet Supported on Floor or Stool Able to sit safely and securely 2 minutes   ? Stand to Sit Sits safely with minimal use of hands   ? Transfers Able to transfer safely, minor use of hands   ? Standing Unsupported with Eyes Closed Able to stand 10 seconds with supervision   ? Standing Unsupported with Feet Together Needs help to attain position but able to stand for 30 seconds with feet together   2-3 inches btw feet  ? From Standing, Reach Forward with Outstretched Arm Can reach forward >12 cm safely (5")   ? From Standing Position, Pick up Object from [place] to pick up shoe safely and easily   ? From Standing Position, Turn to Look Behind Over each Shoulder Needs supervision when turning   ? Turn 360 Degrees Needs close supervision or verbal cueing   ? Standing Unsupported, Alternately Place Feet on Step/Stool Needs assistance to keep from falling or unable to try   ? Standing Unsupported, One Foot in Front Needs help to step but can hold 15 seconds   ? Standing on One Leg Tries to lift leg/unable to hold 3 seconds but remains standing independently   ? Total Score 34   ? Berg comment: < 36 high risk for falls (close to  100%)   ?  ? Timed Up and Go Test  ? TUG Normal TUG   ? Normal TUG (seconds) 25.62   with SPC; 27.44 sec w/o AD  ? ?  ?  ? ?  ? ? ? ? ? ? ? ? ? ? ? ? ? ?Objective measurements completed on examination: See

## 2021-12-02 ENCOUNTER — Encounter: Payer: Self-pay | Admitting: Physical Therapy

## 2021-12-02 ENCOUNTER — Other Ambulatory Visit: Payer: Self-pay

## 2021-12-02 ENCOUNTER — Ambulatory Visit: Payer: Medicare Other | Admitting: Physical Therapy

## 2021-12-02 DIAGNOSIS — R2681 Unsteadiness on feet: Secondary | ICD-10-CM | POA: Diagnosis not present

## 2021-12-02 DIAGNOSIS — Z9181 History of falling: Secondary | ICD-10-CM

## 2021-12-02 DIAGNOSIS — M6281 Muscle weakness (generalized): Secondary | ICD-10-CM

## 2021-12-02 DIAGNOSIS — R2689 Other abnormalities of gait and mobility: Secondary | ICD-10-CM

## 2021-12-02 NOTE — Therapy (Signed)
[place] ?[address]  [address] ?[place], [place], [address] ?Phone: [phone]   Fax:  [phone] ? ?Physical Therapy Treatment ? ?Patient Details  ?Name: [name] Soto ?MRN: [id number] ?Date of Birth: 1946-02-17 ?Referring Provider (PT): Curt Bears, MD ? ? ?Encounter Date: 12/02/2021 ? ? PT End of Session - 12/02/21 1101   ? ? Visit Number 2   ? Number of Visits 12   ? Date for PT Re-Evaluation 01/07/22   ? Authorization Type Medicare & BCBS   ? PT Start Time 1101   ? PT Stop Time 1147   ? PT Time Calculation (min) 46 min   ? Activity Tolerance Patient tolerated treatment well;Patient limited by fatigue   ? Behavior During Therapy WFL for tasks assessed/performed;Flat affect   ? ?  ?  ? ?  ? ? ?Past Medical History:  ?Diagnosis Date  ? At risk for falls   ? Balance problem   ? Cerebral thrombosis with cerebral infarction [facility])   ? Diabetes mellitus without complication ([facility])   ? GERD (gastroesophageal reflux disease)   ? History of kidney stones   ? Humerus fracture   ? PROXIMAL RIGHT  ? Hypertension   ? Nausea and vomiting   ? Pancreatic lesion   ? PONV (postoperative nausea and vomiting)   ? Stroke [facility]) 2012  ? ? ?Past Surgical History:  ?Procedure Laterality Date  ? CATARACT EXTRACTION Bilateral   ? CYSTOSCOPY WITH RETROGRADE PYELOGRAM, URETEROSCOPY AND STENT PLACEMENT Right 01/31/2021  ? Procedure: CYSTOSCOPY WITH RIGHT  RETROGRADE URETEROSCOPY HOLMIUM LASER AND STENT PLACEMENT;  Surgeon: [name], MD;  [facility];  Service: Urology;  Laterality: Right;  ? EUS N/A 07/17/2015  ? Procedure: ESOPHAGEAL ENDOSCOPIC ULTRASOUND (EUS) RADIAL;  Surgeon: Willis Modena, MD;  [facility];  Service: Endoscopy;  Laterality: N/A;  ? FINE NEEDLE ASPIRATION N/A 07/17/2015  ? Procedure: FINE NEEDLE ASPIRATION (FNA) RADIAL;  Surgeon: Willis Modena, MD;  [facility];  Service: Endoscopy;  Laterality: N/A;  ? HEMATOMA DRAINED    ?  HERNIA REPAIR    ? inguinal right  ? HYDROCELE EXCISION    ? ORIF HUMERUS FRACTURE Right 09/26/2020  ? Procedure: Open reduction internal fixation right humeral periprosthetic fracture;  Surgeon: Francena Hanly, MD;  [facility];  Service: Orthopedics;  Laterality: Right;   ? REVERSE SHOULDER ARTHROPLASTY Right 05/07/2016  ? Procedure: RIGHT REVERSE TOTAL SHOULDER ARTHROPLASTY;  Surgeon: Francena Hanly, MD;  [facility];  Service: Orthopedics;  Laterality: Right;  ? ? ?There were no vitals filed for this visit. ? ? Subjective Assessment - 12/02/21 1106   ? ? Subjective Pt denies pain other than the soreness in his toes (from the hammer toes) when he walks.   ? 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 Patient Stated Goals "to be able to walk w/o a cane"   ? Currently in Pain? No/denies   ? ?  ?  ? ?  ? ? ? ? ? [facility] PT Assessment - 12/02/21 1101   ? ?  ? Standardized Balance Assessment  ? Standardized Balance Assessment Dynamic Gait Index   ?  ? Dynamic Gait Index  ? Level Surface Moderate Impairment   ? Change in Gait Speed Moderate Impairment   ? Gait with  Horizontal Head Turns Moderate Impairment   ? Gait with Vertical Head Turns Moderate Impairment   ? Gait and Pivot Turn Moderate Impairment   ? Step Over Obstacle Severe Impairment   ? Step Around Obstacles Moderate Impairment   ? Steps Moderate Impairment   ? Total Score 7   ? DGI comment: Scores of 19 or less are predictive of falls in older community living adults   ? ?  ?  ? ?  ? ? ? ? ? ? ? ? ? ? ? ? ? ? ? ? [facility] Adult PT Treatment/Exercise - 12/02/21 1101   ? ?  ? Knee/Hip Exercises: Aerobic  ? Nustep L3 x 6 min (UE/LE)   ? ?  ?  ? ?  ? ? ? ? ? ? Balance Exercises - 12/02/21 1101   ? ?  ? OTAGO PROGRAM  ? Head Movements Standing;5 reps   ? Neck Movements Standing;5 reps   ? Back Extension Standing;5 reps   ?  Trunk Movements Standing;5 reps   ? Ankle Movements Sitting;10 reps   ? Knee Extensor 10 reps   ? Knee Flexor 10 reps   ? Hip ABductor 10 reps   ? Ankle Plantorflexors --   10 reps, support  ? Ankle Dorsiflexors --   10 reps, support  ? Knee Bends 10 reps, support   ? Backwards Walking Support   ? Walking and Turning Around No assistive device   ? ?  ?  ? ?  ? ? ? ? ? ? ? PT Short Term Goals - 12/02/21 1107   ? ?  ? PT SHORT TERM GOAL #1  ? Title Patient will be independent with initial HEP including [employer] Program at a supported level   ? Status On-going   ? Target Date 12/17/21   ? ?  ?  ? ?  ? ? ? ? PT Long Term Goals - 12/02/21 1107   ? ?  ? PT LONG TERM GOAL #1  ? Title Patient will be independent with ongoing/advanced HEP +/- [place] Fall Prevention Program for self-management at home   ? Status On-going   ? Target Date 01/07/22   ?  ? PT LONG TERM GOAL #2  ? Title Patient will demonstrate improved B proximal LE strength to >/= 4+/5 for improved stability and ease of mobility   ? Status On-going   ? Target Date 01/07/22   ?  ? PT LONG TERM GOAL #3  ? Title Patient will improve Berg score to >/= 45/56 to improve safety and stability with ADLs in standing and reduce risk for falls   ? Baseline 34/56 (11/26/21)   ? Status On-going   ? Target Date 01/07/22   ?  ? PT LONG TERM GOAL #4  ? Title Patient will demonstrate decreased TUG time to </= 18 sec to decrease risk for falls with transitional mobility   ? Baseline 25.62 sec with SPC, 27.44 sec w/o AD (11/26/21)   ? Status On-going   ? Target Date 01/07/22   ?  ? PT LONG TERM GOAL #5  ? Title Patient will ambulate with improved gait pattern and good gait stability with LRAD   ? Status On-going   ? Target Date 01/07/22   ?  ? PT LONG TERM GOAL #6  ? Title Patient will improve DGI score to >/= 16/24 to improve gait stability and reduce risk for falls   ? Baseline 7/24 (12/02/21)   ?  Status New   ? Target Date 01/07/22   ? ?  ?  ? ?  ? ? ? ? ? ? ? ? Plan -  12/02/21 1107   ? ? Clinical Impression Statement DGI assessment completed using SPC with score of 7/24 indicating a very high risk for fall. Initiated instruction in [place] fall prevention program as initial HEP, highlighting exercises to be completed as part of HEP and instructing pt to skip any non-highlighted exercises/activities. [name] Soto required intermittent seated rest breaks due to fatigue, therefore pointed out that he can divide up the exercises at home marking where he leaves off and picking back up again later or another day, with expectation of completing all highlighted exercises/activities at least 3x/wk.   ? 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 Rehab Potential Good   ? PT Frequency 2x / week   ? PT Duration 6 weeks   ? PT Treatment/Interventions ADLs/Self Care Home Management;DME Instruction;Gait training;Stair training;Functional mobility training;Therapeutic activities;Therapeutic exercise;Balance training;Neuromuscular re-education;Patient/family education;Manual techniques   ? PT Next Visit Plan Complete instruction in [place] fall prevention program and address any questions/concerns from initial instruction;  LE strengthening and balance   ? PT [facility]   ? Consulted and Agree with Plan of Care Patient;Family member/caregiver   ? Family Member Consulted wife - Kathie RhodesBetty   ? ?  ?  ? ?  ? ? ?Patient will benefit from skilled therapeutic intervention in order to improve the following deficits and impairments:  Abnormal gait, Decreased activity tolerance, Decreased balance, Decreased endurance, Decreased knowledge of precautions, Decreased knowledge of use of DME, Decreased mobility, Decreased safety awareness, Decreased strength, Impaired perceived functional ability, Improper body mechanics, Postural dysfunction, Pain ? ?Visit  Diagnosis: ?Unsteadiness on feet ? ?Other abnormalities of gait and mobility ? ?History of falling ? ?Muscle weakness (generalized) ? ? ? ? ?Problem List ?Patient Active Problem List  ? Diagnosis Date Note

## 2021-12-05 ENCOUNTER — Ambulatory Visit: Payer: Medicare Other | Admitting: Physical Therapy

## 2021-12-05 ENCOUNTER — Other Ambulatory Visit: Payer: Self-pay

## 2021-12-05 ENCOUNTER — Encounter: Payer: Self-pay | Admitting: Physical Therapy

## 2021-12-05 DIAGNOSIS — Z9181 History of falling: Secondary | ICD-10-CM

## 2021-12-05 DIAGNOSIS — R2689 Other abnormalities of gait and mobility: Secondary | ICD-10-CM

## 2021-12-05 DIAGNOSIS — R2681 Unsteadiness on feet: Secondary | ICD-10-CM | POA: Diagnosis not present

## 2021-12-05 DIAGNOSIS — M6281 Muscle weakness (generalized): Secondary | ICD-10-CM

## 2021-12-05 NOTE — Therapy (Signed)
[facility] ?[address]  [address] ?[place], [place], [address] ?Phone: [phone]   Fax:  [phone] ? ?Physical Therapy Treatment ? ?Patient Details  ?Name: [name] Soto ?MRN: [id number] ?Date of Birth: 1945-12-09 ?Referring Provider (PT): Curt Bears, MD ? ? ?Encounter Date: 12/05/2021 ? ? PT End of Session - 12/05/21 1104   ? ? Visit Number 3   ? Number of Visits 12   ? Date for PT Re-Evaluation 01/07/22   ? Authorization Type Medicare & BCBS   ? PT Start Time 1100   ? PT Stop Time 1140   ? PT Time Calculation (min) 40 min   ? Activity Tolerance Patient tolerated treatment well;Patient limited by fatigue   ? Behavior During Therapy WFL for tasks assessed/performed;Flat affect   ? ?  ?  ? ?  ? ? ?Past Medical History:  ?Diagnosis Date  ? At risk for falls   ? Balance problem   ? Cerebral thrombosis with cerebral infarction [facility])   ? Diabetes mellitus without complication (HCC)   ? GERD (gastroesophageal reflux disease)   ? History of kidney stones   ? Humerus fracture   ? PROXIMAL RIGHT  ? Hypertension   ? Nausea and vomiting   ? Pancreatic lesion   ? PONV (postoperative nausea and vomiting)   ? Stroke [facility]) 2012  ? ? ?Past Surgical History:  ?Procedure Laterality Date  ? CATARACT EXTRACTION Bilateral   ? CYSTOSCOPY WITH RETROGRADE PYELOGRAM, URETEROSCOPY AND STENT PLACEMENT Right 01/31/2021  ? Procedure: CYSTOSCOPY WITH RIGHT  RETROGRADE URETEROSCOPY HOLMIUM LASER AND STENT PLACEMENT;  Surgeon: [name], MD;  [facility]: [facility];  Service: Urology;  Laterality: Right;  ? EUS N/A 07/17/2015  ? Procedure: ESOPHAGEAL ENDOSCOPIC ULTRASOUND (EUS) RADIAL;  Surgeon: Willis Modena, MD;  [facility];  Service: Endoscopy;  Laterality: N/A;  ? FINE NEEDLE ASPIRATION N/A 07/17/2015  ? Procedure: FINE NEEDLE ASPIRATION (FNA) RADIAL;  Surgeon: Willis Modena, MD;  [facility];  Service: Endoscopy;  Laterality: N/A;  ? HEMATOMA DRAINED    ?  HERNIA REPAIR    ? inguinal right  ? HYDROCELE EXCISION    ? ORIF HUMERUS FRACTURE Right 09/26/2020  ? Procedure: Open reduction internal fixation right humeral periprosthetic fracture;  Surgeon: Francena Hanly, MD;  [facility];  Service: Orthopedics;  Laterality: Right;   ? REVERSE SHOULDER ARTHROPLASTY Right 05/07/2016  ? Procedure: RIGHT REVERSE TOTAL SHOULDER ARTHROPLASTY;  Surgeon: Francena Hanly, MD;  [facility];  Service: Orthopedics;  Laterality: Right;  ? ? ?There were no vitals filed for this visit. ? ? Subjective Assessment - 12/05/21 1104   ? ? Subjective Pt reports no pain, no new falls, and performed HEP.   ? 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 Patient Stated Goals "to be able to walk w/o a cane"   ? Currently in Pain? No/denies   ? ?  ?  ? ?  ? ? ? ? ? ? ? ? ? ? ? ? ? ? ? ? ? ? ? ? [facility] Adult PT Treatment/Exercise - 12/05/21 0001   ? ?  ? Knee/Hip Exercises: Aerobic  ? Nustep L5 x 6 min (UE/LE)   ? ?  ?  ? ?  ? ? ? ? ? ? Balance Exercises -  12/05/21 0001   ? ?  ? OTAGO PROGRAM  ? Knee Flexor 20 reps   ? Hip ABductor 20 reps   ? Ankle Plantorflexors --   10 reps support  ? Ankle Dorsiflexors --   10 reps support  ? Knee Bends 10 reps, support   ? Backwards Walking Support   ? Walking and Turning Around Assistive device   ? Sideways Walking Assistive device   counter support  ? Tandem Stance 10 seconds, support   ? Tandem Walk Support   1 large LOB initially  ? One Leg Stand 10 seconds, support   ? ?  ?  ? ?  ? ? ? ? ? ? ? PT Short Term Goals - 12/02/21 1107   ? ?  ? PT SHORT TERM GOAL #1  ? Title Patient will be independent with initial HEP including [employer] Program at a supported level   ? Status On-going   ? Target Date 12/17/21   ? ?  ?  ? ?  ? ? ? ? PT Long Term Goals - 12/02/21 1107   ? ?  ? PT LONG TERM GOAL #1  ? Title  Patient will be independent with ongoing/advanced HEP +/- [place] Fall Prevention Program for self-management at home   ? Status On-going   ? Target Date 01/07/22   ?  ? PT LONG TERM GOAL #2  ? Title Patient will demonstrate improved B proximal LE strength to >/= 4+/5 for improved stability and ease of mobility   ? Status On-going   ? Target Date 01/07/22   ?  ? PT LONG TERM GOAL #3  ? Title Patient will improve Berg score to >/= 45/56 to improve safety and stability with ADLs in standing and reduce risk for falls   ? Baseline 34/56 (11/26/21)   ? Status On-going   ? Target Date 01/07/22   ?  ? PT LONG TERM GOAL #4  ? Title Patient will demonstrate decreased TUG time to </= 18 sec to decrease risk for falls with transitional mobility   ? Baseline 25.62 sec with SPC, 27.44 sec w/o AD (11/26/21)   ? Status On-going   ? Target Date 01/07/22   ?  ? PT LONG TERM GOAL #5  ? Title Patient will ambulate with improved gait pattern and good gait stability with LRAD   ? Status On-going   ? Target Date 01/07/22   ?  ? PT LONG TERM GOAL #6  ? Title Patient will improve DGI score to >/= 16/24 to improve gait stability and reduce risk for falls   ? Baseline 7/24 (12/02/21)   ? Status New   ? Target Date 01/07/22   ? ?  ?  ? ?  ? ? ? ? ? ? ? ? Plan - 12/05/21 1147   ? ? Clinical Impression Statement Continued to progress OTAGO fall prevention progress.  Needed intermittent rest breaks throughout due to fatigue, and close SBA initially with novel exercises, he had one large LOB transitioning from tandem stand to tandem walk (caught foot).  Educated on safety throughout with exercises.   ? 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 Rehab Potential Good   ? PT Frequency 2x / week   ? PT Duration 6 weeks   ? PT Treatment/Interventions ADLs/Self Care Home Management;DME  Instruction;Gait training;Stair  training;Functional mobility training;Therapeutic activities;Therapeutic exercise;Balance training;Neuromuscular re-education;Patient/family education;Manual techniques   ? PT Next Visit Plan [place] prevention program and address any questions/concerns from initial instruction;  LE strengthening and balance   ? PT Home Exercise Plan [facility]   ? Consulted and Agree with Plan of Care Patient;Family member/caregiver   ? Family Member Consulted wife - Kathie RhodesBetty   ? ?  ?  ? ?  ? ? ?Patient will benefit from skilled therapeutic intervention in order to improve the following deficits and impairments:  Abnormal gait, Decreased activity tolerance, Decreased balance, Decreased endurance, Decreased knowledge of precautions, Decreased knowledge of use of DME, Decreased mobility, Decreased safety awareness, Decreased strength, Impaired perceived functional ability, Improper body mechanics, Postural dysfunction, Pain ? ?Visit Diagnosis: ?Unsteadiness on feet ? ?Other abnormalities of gait and mobility ? ?History of falling ? ?Muscle weakness (generalized) ? ? ? ? ?Problem List ?Patient Active Problem List  ? Diagnosis Date Noted  ? Acute kidney injury (HCC) 01/31/2021  ? Right ureteral stone 01/31/2021  ? Essential hypertension 01/31/2021  ? GERD without esophagitis 01/31/2021  ? Type 2 diabetes mellitus with complication, without long-term current use of insulin (HCC) 01/31/2021  ? Mixed diabetic hyperlipidemia associated with type 2 diabetes mellitus (HCC) 01/31/2021  ? Acute renal failure (ARF) (HCC) 01/31/2021  ? S/P ORIF (open reduction internal fixation) fracture 09/26/2020  ? 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 ? ?Jena GaussElizabeth J Mallorie Norrod, PT, DPT  ?12/05/2021, 11:50 AM ? ?[place] [facility] ?[address]  [address] ?[place], [place], [address] ?Phone: [phone]   Fax:  [phone] ? ?Name: Wonda OldsJames H Armentor ?MRN:  [id number] ?Date of Birth: 11/10/1945 ? ? ? ?

## 2021-12-09 ENCOUNTER — Ambulatory Visit: Payer: Medicare Other

## 2021-12-09 ENCOUNTER — Other Ambulatory Visit: Payer: Self-pay

## 2021-12-09 DIAGNOSIS — R2681 Unsteadiness on feet: Secondary | ICD-10-CM | POA: Diagnosis not present

## 2021-12-09 DIAGNOSIS — R2689 Other abnormalities of gait and mobility: Secondary | ICD-10-CM

## 2021-12-09 DIAGNOSIS — M25611 Stiffness of right shoulder, not elsewhere classified: Secondary | ICD-10-CM

## 2021-12-09 DIAGNOSIS — Z9181 History of falling: Secondary | ICD-10-CM

## 2021-12-09 DIAGNOSIS — M6281 Muscle weakness (generalized): Secondary | ICD-10-CM

## 2021-12-09 NOTE — Therapy (Signed)
[place] [facility] ?[address]  [address] ?[place], [place], [address] ?Phone: [phone]   Fax:  [phone] ? ?Physical Therapy Treatment ? ?Patient Details  ?Name: [name] Soto ?MRN: [id number] ?Date of Birth: 1946/05/30 ?Referring Provider (PT): Curt BearsLirim Tonuzi, MD ? ? ?Encounter Date: 12/09/2021 ? ? PT End of Session - 12/09/21 1101   ? ? Visit Number 4   ? Number of Visits 12   ? Date for PT Re-Evaluation 01/07/22   ? Authorization Type Medicare & BCBS   ? PT Start Time 1022   pt late  ? PT Stop Time 1058   ? PT Time Calculation (min) 36 min   ? Activity Tolerance Patient tolerated treatment well;Patient limited by fatigue   ? Behavior During Therapy WFL for tasks assessed/performed;Flat affect   ? ?  ?  ? ?  ? ? ?Past Medical History:  ?Diagnosis Date  ? At risk for falls   ? Balance problem   ? Cerebral thrombosis with cerebral infarction [facility](HCC)   ? Diabetes mellitus without complication (HCC)   ? GERD (gastroesophageal reflux disease)   ? History of kidney stones   ? Humerus fracture   ? PROXIMAL RIGHT  ? Hypertension   ? Nausea and vomiting   ? Pancreatic lesion   ? PONV (postoperative nausea and vomiting)   ? Stroke [facility](HCC) 2012  ? ? ?Past Surgical History:  ?Procedure Laterality Date  ? CATARACT EXTRACTION Bilateral   ? CYSTOSCOPY WITH RETROGRADE PYELOGRAM, URETEROSCOPY AND STENT PLACEMENT Right 01/31/2021  ? Procedure: CYSTOSCOPY WITH RIGHT  RETROGRADE URETEROSCOPY HOLMIUM LASER AND STENT PLACEMENT;  Surgeon: Sebastian AcheManny, Theodore, MD;  [facility];  Service: Urology;  Laterality: Right;  ? EUS N/A 07/17/2015  ? Procedure: ESOPHAGEAL ENDOSCOPIC ULTRASOUND (EUS) RADIAL;  Surgeon: Willis ModenaWilliam Outlaw, MD;  [facility];  Service: Endoscopy;  Laterality: N/A;  ? FINE NEEDLE ASPIRATION N/A 07/17/2015  ? Procedure: FINE NEEDLE ASPIRATION (FNA) RADIAL;  Surgeon: Willis ModenaWilliam Outlaw, MD;  [facility];  Service: Endoscopy;  Laterality: N/A;  ? HEMATOMA DRAINED     ? HERNIA REPAIR    ? inguinal right  ? HYDROCELE EXCISION    ? ORIF HUMERUS FRACTURE Right 09/26/2020  ? Procedure: Open reduction internal fixation right humeral periprosthetic fracture;  Surgeon: Francena HanlySupple, Kevin, MD;  [facility];  Service: Orthopedics;  Laterality: Right;  180min  ? REVERSE SHOULDER ARTHROPLASTY Right 05/07/2016  ? Procedure: RIGHT REVERSE TOTAL SHOULDER ARTHROPLASTY;  Surgeon: Francena HanlyKevin Supple, MD;  [facility];  Service: Orthopedics;  Laterality: Right;  ? ? ?There were no vitals filed for this visit. ? ? Subjective Assessment - 12/09/21 1024   ? ? Subjective "My toes are sore, I am going to see a podiatrist because I have callouses on my toes."   ? Patient is accompained by: Family member   ? 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 Diagnostic tests per wife- recent xrays on 11/06/20 showed good healing   ? Patient Stated Goals "to be able to walk w/o a cane"   ? Currently in Pain? No/denies   ? ?  ?  ? ?  ? ? ? ? ? ? ? ? ? ? ? ? ? ? ? ? ? ? ? ? [facility] Adult PT Treatment/Exercise - 12/09/21 0001   ? ?  ?  Knee/Hip Exercises: Aerobic  ? Recumbent Bike L1x74min   ?  ? Knee/Hip Exercises: Seated  ? Clamshell with TheraBand Red   x 10  ? Marching Strengthening;Both;10 reps   ? Marching Limitations RTB   ?  ? Shoulder Exercises: Standing  ? Retraction AROM;Both;20 reps   ? Retraction Limitations at counter no UE support   ? ?  ?  ? ?  ? ? ? ? ? ? Balance Exercises - 12/09/21 0001   ? ?  ? Balance Exercises: Standing  ? Stepping Strategy Lateral;5 reps   R/L no UE support  ?  ? OTAGO PROGRAM  ? Knee Extensor 10 reps;Weight (comment)   1lb  ? Knee Flexor 10 reps;Weight (comment)   1lb each leg  ? Hip ABductor 10 reps;Weight (comment)   1lb each leg  ? Ankle Plantorflexors 20 reps, support   ? Ankle Dorsiflexors 20 reps, support   ? Sideways Walking --   4x along  the counter  ? ?  ?  ? ?  ? ? ? ? ? ? ? PT Short Term Goals - 12/02/21 1107   ? ?  ? PT SHORT TERM GOAL #1  ? Title Patient will be independent with initial HEP including [employer] Program at a supported level   ? Status On-going   ? Target Date 12/17/21   ? ?  ?  ? ?  ? ? ? ? PT Long Term Goals - 12/02/21 1107   ? ?  ? PT LONG TERM GOAL #1  ? Title Patient will be independent with ongoing/advanced HEP +/- [place] Fall Prevention Program for self-management at home   ? Status On-going   ? Target Date 01/07/22   ?  ? PT LONG TERM GOAL #2  ? Title Patient will demonstrate improved B proximal LE strength to >/= 4+/5 for improved stability and ease of mobility   ? Status On-going   ? Target Date 01/07/22   ?  ? PT LONG TERM GOAL #3  ? Title Patient will improve Berg score to >/= 45/56 to improve safety and stability with ADLs in standing and reduce risk for falls   ? Baseline 34/56 (11/26/21)   ? Status On-going   ? Target Date 01/07/22   ?  ? PT LONG TERM GOAL #4  ? Title Patient will demonstrate decreased TUG time to </= 18 sec to decrease risk for falls with transitional mobility   ? Baseline 25.62 sec with SPC, 27.44 sec w/o AD (11/26/21)   ? Status On-going   ? Target Date 01/07/22   ?  ? PT LONG TERM GOAL #5  ? Title Patient will ambulate with improved gait pattern and good gait stability with LRAD   ? Status On-going   ? Target Date 01/07/22   ?  ? PT LONG TERM GOAL #6  ? Title Patient will improve DGI score to >/= 16/24 to improve gait stability and reduce risk for falls   ? Baseline 7/24 (12/02/21)   ? Status New   ? Target Date 01/07/22   ? ?  ?  ? ?  ? ? ? ? ? ? ? ? Plan - 12/09/21 1102   ? ? Clinical Impression Statement Pt had a good response to treatment. Did a mix of OTAGO exercises along with general strengthening and balance. Progressed with a couple of standing TE unsupported. Showed a tendency to rotate trunk with sideteps and ER hips on leading side. Also  demonstrated a tedency to lose footing  with lateral steps. Pt was limited by some fatigue requiring ~4 seated rest breaks in between standing TE.   ? Personal Factors and Comorbidities Age;Behavior Pattern;Comorbidity 3+;Past/Current Experience;Time since onset of injury/illness/exacerbation;Transportation   ? 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 PT Frequency 2x / week   ? PT Duration 6 weeks   ? PT Treatment/Interventions ADLs/Self Care Home Management;DME Instruction;Gait training;Stair training;Functional mobility training;Therapeutic activities;Therapeutic exercise;Balance training;Neuromuscular re-education;Patient/family education;Manual techniques   ? PT Next Visit Plan Complete instruction in [place] fall prevention program and address any questions/concerns from initial instruction;  LE strengthening and balance   ? PT Home Exercise Plan [facility] Prevention Program   ? Consulted and Agree with Plan of Care Patient;Family member/caregiver   ? Family Member Consulted wife - [name] Soto   ? ?  ?  ? ?  ? ? ?Patient will benefit from skilled therapeutic intervention in order to improve the following deficits and impairments:  Abnormal gait, Decreased activity tolerance, Decreased balance, Decreased endurance, Decreased knowledge of precautions, Decreased knowledge of use of DME, Decreased mobility, Decreased safety awareness, Decreased strength, Impaired perceived functional ability, Improper body mechanics, Postural dysfunction, Pain ? ?Visit Diagnosis: ?Unsteadiness on feet ? ?Other abnormalities of gait and mobility ? ?History of falling ? ?Muscle weakness (generalized) ? ?Stiffness of right shoulder, not elsewhere classified ? ? ? ? ?Problem List ?Patient Active Problem List  ? Diagnosis Date Noted  ? Acute kidney injury (HCC) 01/31/2021  ? Right ureteral stone 01/31/2021  ? Essential hypertension 01/31/2021  ?  GERD without esophagitis 01/31/2021  ? Type 2 diabetes mellitus with complication, without long-term current use of insulin (HCC) 01/31/2021  ? 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

## 2021-12-12 ENCOUNTER — Ambulatory Visit: Payer: Medicare Other

## 2021-12-12 DIAGNOSIS — R2681 Unsteadiness on feet: Secondary | ICD-10-CM

## 2021-12-12 DIAGNOSIS — R2689 Other abnormalities of gait and mobility: Secondary | ICD-10-CM

## 2021-12-12 DIAGNOSIS — M6281 Muscle weakness (generalized): Secondary | ICD-10-CM

## 2021-12-12 DIAGNOSIS — Z9181 History of falling: Secondary | ICD-10-CM

## 2021-12-12 NOTE — Therapy (Signed)
[facility] ?[place] ?[place], [place], [address] ?Phone: [phone]   Fax:  [phone] ? ?Physical Therapy Treatment ? ?Patient Details  ?Name: [name] Soto ?MRN: [id number] ?Date of Birth: October 08, 1945 ?Referring Provider (PT): Karl Luke, MD ? ? ?Encounter Date: 12/12/2021 ? ? PT End of Session - 12/12/21 1159   ? ? Visit Number 5   ? Number of Visits 12   ? Date for PT Re-Evaluation 01/07/22   ? Authorization Type Medicare & BCBS   ? PT Start Time 1104   ? PT Stop Time [id number]   ? PT Time Calculation (min) 45 min   ? Activity Tolerance Patient tolerated treatment well;Patient limited by fatigue   ? Behavior During Therapy [facility] for tasks assessed/performed   ? ?  ?  ? ?  ? ? ?Past Medical History:  ?Diagnosis Date  ? At risk for falls   ? Balance problem   ? Cerebral thrombosis with cerebral infarction [facility])   ? Diabetes mellitus without complication ([place])   ? GERD (gastroesophageal reflux disease)   ? History of kidney stones   ? Humerus fracture   ? PROXIMAL RIGHT  ? Hypertension   ? Nausea and vomiting   ? Pancreatic lesion   ? PONV (postoperative nausea and vomiting)   ? Stroke [facility]) 2012  ? ? ?Past Surgical History:  ?Procedure Laterality Date  ? CATARACT EXTRACTION Bilateral   ? CYSTOSCOPY WITH RETROGRADE PYELOGRAM, URETEROSCOPY AND STENT PLACEMENT Right 01/31/2021  ? Procedure: CYSTOSCOPY WITH RIGHT  RETROGRADE URETEROSCOPY HOLMIUM LASER AND STENT PLACEMENT;  Surgeon: Alexis Frock, MD;  [facility];  Service: Urology;  Laterality: Right;  ? EUS N/A 07/17/2015  ? Procedure: ESOPHAGEAL ENDOSCOPIC ULTRASOUND (EUS) RADIAL;  Surgeon: Arta Silence, MD;  [facility];  Service: Endoscopy;  Laterality: N/A;  ? FINE NEEDLE ASPIRATION N/A 07/17/2015  ? Procedure: FINE NEEDLE ASPIRATION (FNA) RADIAL;  Surgeon: Arta Silence, MD;  [facility];  Service: Endoscopy;  Laterality: N/A;  ? HEMATOMA DRAINED    ? HERNIA REPAIR     ? inguinal right  ? HYDROCELE EXCISION    ? ORIF HUMERUS FRACTURE Right 09/26/2020  ? Procedure: Open reduction internal fixation right humeral periprosthetic fracture;  Surgeon: Justice Britain, MD;  [facility];  Service: Orthopedics;  Laterality: Right;  138min  ? REVERSE SHOULDER ARTHROPLASTY Right 05/07/2016  ? Procedure: RIGHT REVERSE TOTAL SHOULDER ARTHROPLASTY;  Surgeon: Justice Britain, MD;  Location: [place] [facility];  Service: Orthopedics;  Laterality: Right;  ? ? ?There were no vitals filed for this visit. ? ? Subjective Assessment - 12/12/21 1106   ? ? Subjective Pt reports feeling as if he doesn't do his exercises as much as he should, no pain today.   ? Patient is accompained by: Family member   ? 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 Diagnostic tests per wife- recent xrays on 11/06/20 showed good healing   ? Patient Stated Goals "to be able to walk w/o a cane"   ? Currently in Pain? No/denies   ? ?  ?  ? ?  ? ? ? ? ? ? ? ? ? ? ? ? ? ? ? ? ? ? ? ? [place] Adult PT Treatment/Exercise - 12/12/21 0001   ? ?  ?  Knee/Hip Exercises: Aerobic  ? Nustep L5 x 6 min (UE/LE)   ?  ? Shoulder Exercises: Standing  ? Extension Strengthening;Both;10 reps;Theraband   ? Theraband Level (Shoulder Extension) Level 2 (Red)   ? Extension Limitations back against counter   ? Row Strengthening;Both;10 reps;Theraband   ? Theraband Level (Shoulder Row) Level 2 (Red)   ? Row Limitations back against counter   ? Retraction AROM;Both;20 reps   ? Retraction Limitations at counter no UE support   ? ?  ?  ? ?  ? ? ? ? ? ? Balance Exercises - 12/12/21 0001   ? ?  ? Balance Exercises: Standing  ? Tandem Stance Eyes open;Intermittent upper extremity support;2 reps   each foot in front x2 each, had to do semi tandem with R LE in front; 10 sec each  ? SLS Intermittent upper extremity support   ? Stepping  Strategy Anterior;Lateral;UE support   15 reps; cues to increase heel strike and for anterior WS  ? Tandem Gait Forward;Upper extremity support;4 reps   15 ft along the counter, x 4  ? Retro Gait --   x4 15 ft along the counter  ? Sidestepping Upper extremity support;2 reps   over 2 canes and one 1/2 foam roll; along the counter  ? ?  ?  ? ?  ? ? ? ? ? ? ? PT Short Term Goals - 12/12/21 1200   ? ?  ? PT SHORT TERM GOAL #1  ? Title Patient will be independent with initial HEP including [employer] Program at a supported level   ? Status On-going   pt reported partial compliance; 12/12/21  ? Target Date 12/17/21   ? ?  ?  ? ?  ? ? ? ? PT Long Term Goals - 12/02/21 1107   ? ?  ? PT LONG TERM GOAL #1  ? Title Patient will be independent with ongoing/advanced HEP +/- [place] Fall Prevention Program for self-management at home   ? Status On-going   ? Target Date 01/07/22   ?  ? PT LONG TERM GOAL #2  ? Title Patient will demonstrate improved B proximal LE strength to >/= 4+/5 for improved stability and ease of mobility   ? Status On-going   ? Target Date 01/07/22   ?  ? PT LONG TERM GOAL #3  ? Title Patient will improve Berg score to >/= 45/56 to improve safety and stability with ADLs in standing and reduce risk for falls   ? Baseline 34/56 (11/26/21)   ? Status On-going   ? Target Date 01/07/22   ?  ? PT LONG TERM GOAL #4  ? Title Patient will demonstrate decreased TUG time to </= 18 sec to decrease risk for falls with transitional mobility   ? Baseline 25.62 sec with SPC, 27.44 sec w/o AD (11/26/21)   ? Status On-going   ? Target Date 01/07/22   ?  ? PT LONG TERM GOAL #5  ? Title Patient will ambulate with improved gait pattern and good gait stability with LRAD   ? Status On-going   ? Target Date 01/07/22   ?  ? PT LONG TERM GOAL #6  ? Title Patient will improve DGI score to >/= 16/24 to improve gait stability and reduce risk for falls   ? Baseline 7/24 (12/02/21)   ? Status New   ? Target Date 01/07/22   ? ?  ?  ? ?   ? ? ? ? ? ? ? ?  Plan - 12/12/21 1201   ? ? Clinical Impression Statement Pt responded well to treatment. He still shows weakness while being supported on one LE with stepping fwd and lateral. Postural cues given with the standing TE and did scap stab exercises with back against counter for tactile feedback. He also needed instruction with fwd stepping strategy for proper WS. Pt admitted to partial compliance to HEP but educated him on the importance of HEP compliance for max benefit from PT.   ? Personal Factors and Comorbidities Age;Behavior Pattern;Comorbidity 3+;Past/Current Experience;Time since onset of injury/illness/exacerbation;Transportation   ? 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 PT Frequency 2x / week   ? PT Duration 6 weeks   ? PT Treatment/Interventions ADLs/Self Care Home Management;DME Instruction;Gait training;Stair training;Functional mobility training;Therapeutic activities;Therapeutic exercise;Balance training;Neuromuscular re-education;Patient/family education;Manual techniques   ? PT Next Visit Plan Complete instruction in [place] fall prevention program and address any questions/concerns from initial instruction;  LE strengthening and balance   ? PT Home Exercise Plan [facility] Prevention Program   ? Consulted and Agree with Plan of Care Patient;Family member/caregiver   ? Family Member Consulted wife - Inez Catalina   ? ?  ?  ? ?  ? ? ?Patient will benefit from skilled therapeutic intervention in order to improve the following deficits and impairments:  Abnormal gait, Decreased activity tolerance, Decreased balance, Decreased endurance, Decreased knowledge of precautions, Decreased knowledge of use of DME, Decreased mobility, Decreased safety awareness, Decreased strength, Impaired perceived functional ability, Improper body mechanics, Postural dysfunction,  Pain ? ?Visit Diagnosis: ?Unsteadiness on feet ? ?Other abnormalities of gait and mobility ? ?History of falling ? ?Muscle weakness (generalized) ? ? ? ? ?Problem List ?Patient Active Problem List  ? Diagnosis D

## 2021-12-16 ENCOUNTER — Ambulatory Visit: Payer: Medicare Other | Attending: Neurology | Admitting: Physical Therapy

## 2021-12-16 ENCOUNTER — Encounter: Payer: Self-pay | Admitting: Physical Therapy

## 2021-12-16 DIAGNOSIS — Z9181 History of falling: Secondary | ICD-10-CM | POA: Insufficient documentation

## 2021-12-16 DIAGNOSIS — R2681 Unsteadiness on feet: Secondary | ICD-10-CM | POA: Insufficient documentation

## 2021-12-16 DIAGNOSIS — M6281 Muscle weakness (generalized): Secondary | ICD-10-CM | POA: Insufficient documentation

## 2021-12-16 DIAGNOSIS — R2689 Other abnormalities of gait and mobility: Secondary | ICD-10-CM | POA: Diagnosis present

## 2021-12-16 NOTE — Patient Instructions (Signed)
Access Code: [email or web address] ?URL: [email or web address] ?Date: 12/16/2021 ?Prepared by: Glenetta Hew ? ?Exercises ?- Standing Hip Extension with Counter Support  - 1 x daily - 7 x weekly - 2 sets - 10 reps ?- Single Arm Shoulder Extension with Anchored Resistance  - 1 x daily - 7 x weekly - 2 sets - 10 reps ? ? ?

## 2021-12-16 NOTE — Therapy (Signed)
[facility] ?[address]  [address] ?[place], [place], [address] ?Phone: [phone]   Fax:  [phone] ? ?Physical Therapy Treatment ? ?Patient Details  ?Name: [name] Soto ?MRN: [id number] ?Date of Birth: [birth year] ?Referring Provider (PT): Curt BearsLirim Tonuzi, MD ? ? ?Encounter Date: 12/16/2021 ? ? PT End of Session - 12/16/21 1103   ? ? Visit Number 6   ? Number of Visits 12   ? Date for PT Re-Evaluation 01/07/22   ? Authorization Type Medicare & BCBS   ? PT Start Time 1100   ? PT Stop Time 1145   ? PT Time Calculation (min) 45 min   ? Activity Tolerance Patient tolerated treatment well;Patient limited by fatigue   ? [facility] tasks assessed/performed   ? ?  ?  ? ?  ? ? ?Past Medical History:  ?Diagnosis Date  ? At risk for falls   ? Balance problem   ? Cerebral thrombosis with cerebral infarction [facility](HCC)   ? Diabetes mellitus without complication (HCC)   ? GERD (gastroesophageal reflux disease)   ? History of kidney stones   ? Humerus fracture   ? PROXIMAL RIGHT  ? Hypertension   ? Nausea and vomiting   ? Pancreatic lesion   ? PONV (postoperative nausea and vomiting)   ? Stroke [facility](HCC) 2012  ? ? ?Past Surgical History:  ?Procedure Laterality Date  ? CATARACT EXTRACTION Bilateral   ? CYSTOSCOPY WITH RETROGRADE PYELOGRAM, URETEROSCOPY AND STENT PLACEMENT Right 01/31/2021  ? Procedure: CYSTOSCOPY WITH RIGHT  RETROGRADE URETEROSCOPY HOLMIUM LASER AND STENT PLACEMENT;  Surgeon: Sebastian AcheManny, Theodore, MD;  [facility];  Service: Urology;  Laterality: Right;  ? EUS N/A 07/17/2015  ? Procedure: ESOPHAGEAL ENDOSCOPIC ULTRASOUND (EUS) RADIAL;  Surgeon: Willis ModenaWilliam Outlaw, MD;  [facility];  Service: Endoscopy;  Laterality: N/A;  ? FINE NEEDLE ASPIRATION N/A 07/17/2015  ? Procedure: FINE NEEDLE ASPIRATION (FNA) RADIAL;  Surgeon: Willis ModenaWilliam Outlaw, MD;  [facility];  Service: Endoscopy;  Laterality: N/A;  ? HEMATOMA DRAINED    ? HERNIA REPAIR    ?  inguinal right  ? HYDROCELE EXCISION    ? ORIF HUMERUS FRACTURE Right 09/26/2020  ? Procedure: Open reduction internal fixation right humeral periprosthetic fracture;  Surgeon: Francena HanlySupple, Kevin, MD;  [facility];  Service: Orthopedics;  Laterality: Right;  180min  ? REVERSE SHOULDER ARTHROPLASTY Right 05/07/2016  ? Procedure: RIGHT REVERSE TOTAL SHOULDER ARTHROPLASTY;  Surgeon: Francena HanlyKevin Supple, MD;  [facility];  Service: Orthopedics;  Laterality: Right;  ? ? ?There were no vitals filed for this visit. ? ? ? ? ? ? ? ? ? ? ? ? ? ? ? ? ? ? ? ? ? [facility] Adult PT Treatment/Exercise - 12/16/21 0001   ? ?  ? Exercises  ? Exercises Knee/Hip   ?  ? Knee/Hip Exercises: Aerobic  ? Nustep L6 x 6 min (UE/LE)   ?  ? Knee/Hip Exercises: Standing  ? Hip Abduction Stengthening;Both;2 sets;10 reps   ? Abduction Limitations RTB   ? Hip Extension Stengthening;Both;10 reps;Limitations   ? Extension Limitations cues to knee straight, RTB around thighs, counter support, challenging.  Added to HEP (without band).   ?  ? Knee/Hip Exercises: Seated  ? Marching Strengthening;2 sets;10 reps   ? Marching Limitations RTB   ? ?  ?  ? ?  ? ? ? ? ? ? Balance Exercises - 12/16/21 0001   ? ?  ?  Balance Exercises: Standing  ? Sidestepping Upper extremity support;2 reps;Limitations   ? Sidestepping Limitations with RTB around thighs   ? Other Standing Exercises shoulder extensions with RTB to encourage active ankle strategy for balance, chair behind for safety   ?  ? OTAGO PROGRAM  ? Knee Extensor 10 reps;Weight (comment)   2#  ? Knee Flexor 10 reps;Weight (comment)   2#  ? Hip ABductor 10 reps;Weight (comment)   2#  ? Ankle Plantorflexors 20 reps, support   ? Ankle Dorsiflexors 20 reps, support   ? Knee Bends 20 reps, support   ? ?  ?  ? ?  ? ? ? ? ? PT Education - 12/16/21 1152   ? ? Education Details educated on importance of strengthening, how to increase resistance safely (use therabands, information on adjustable ankle weights) add hip extension  to HEP.   ? Person(s) Educated Patient   ? Methods Explanation;Demonstration;Verbal cues;Handout   ? Comprehension Verbalized understanding;Returned demonstration   ? ?  ?  ? ?  ? ? ? PT Short Term Goals - 12/12/21 1200   ? ?  ? PT SHORT TERM GOAL #1  ? Title Patient will be independent with initial HEP including [employer] Program at a supported level   ? Status On-going   pt reported partial compliance; 12/12/21  ? Target Date 12/17/21   ? ?  ?  ? ?  ? ? ? ? PT Long Term Goals - 12/02/21 1107   ? ?  ? PT LONG TERM GOAL #1  ? Title Patient will be independent with ongoing/advanced HEP +/- [place] Fall Prevention Program for self-management at home   ? Status On-going   ? Target Date 01/07/22   ?  ? PT LONG TERM GOAL #2  ? Title Patient will demonstrate improved B proximal LE strength to >/= 4+/5 for improved stability and ease of mobility   ? Status On-going   ? Target Date 01/07/22   ?  ? PT LONG TERM GOAL #3  ? Title Patient will improve Berg score to >/= 45/56 to improve safety and stability with ADLs in standing and reduce risk for falls   ? Baseline 34/56 (11/26/21)   ? Status On-going   ? Target Date 01/07/22   ?  ? PT LONG TERM GOAL #4  ? Title Patient will demonstrate decreased TUG time to </= 18 sec to decrease risk for falls with transitional mobility   ? Baseline 25.62 sec with SPC, 27.44 sec w/o AD (11/26/21)   ? Status On-going   ? Target Date 01/07/22   ?  ? PT LONG TERM GOAL #5  ? Title Patient will ambulate with improved gait pattern and good gait stability with LRAD   ? Status On-going   ? Target Date 01/07/22   ?  ? PT LONG TERM GOAL #6  ? Title Patient will improve DGI score to >/= 16/24 to improve gait stability and reduce risk for falls   ? Baseline 7/24 (12/02/21)   ? Status New   ? Target Date 01/07/22   ? ?  ?  ? ?  ? ? ? ? ? ? ? ? Plan - 12/16/21 1153   ? ? Clinical Impression Statement Pt reports no new falls but still very unsteady with gait.  Reviewed counter exercises/OTAGO  program, progressed with 2# ankle weights which he tolerated well.  Discussed using his therabands at home for increased weights, also given information on adjustable  ankle weights to progress self.  Also added hip extension to program as had difficulty with this exercise due to weakness today.  Pt. would benefit from continued skilled therapy.   ? Personal Factors and Comorbidities Age;Behavior Pattern;Comorbidity 3+;Past/Current Experience;Time since onset of injury/illness/exacerbation;Transportation   ? 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 PT Frequency 2x / week   ? PT Duration 6 weeks   ? PT Treatment/Interventions ADLs/Self Care Home Management;DME Instruction;Gait training;Stair training;Functional mobility training;Therapeutic activities;Therapeutic exercise;Balance training;Neuromuscular re-education;Patient/family education;Manual techniques   ? PT Next Visit Plan Complete instruction in [place] fall prevention program and address any questions/concerns from initial instruction;  LE strengthening and balance   ? PT Home Exercise Plan [facility] Prevention Program   ? Consulted and Agree with Plan of Care Patient;Family member/caregiver   ? Family Member Consulted wife - Kathie Rhodes   ? ?  ?  ? ?  ? ? ?Patient will benefit from skilled therapeutic intervention in order to improve the following deficits and impairments:  Abnormal gait, Decreased activity tolerance, Decreased balance, Decreased endurance, Decreased knowledge of precautions, Decreased knowledge of use of DME, Decreased mobility, Decreased safety awareness, Decreased strength, Impaired perceived functional ability, Improper body mechanics, Postural dysfunction, Pain ? ?Visit Diagnosis: ?Unsteadiness on feet ? ?Other abnormalities of gait and mobility ? ?History of falling ? ?Muscle weakness  (generalized) ? ? ? ? ?Problem List ?Patient Active Problem List  ? Diagnosis Date Noted  ? Acute kidney injury (HCC) 01/31/2021  ? Right ureteral stone 01/31/2021  ? Essential hypertension 01/31/2021  ? GERD without esophagitis

## 2021-12-19 ENCOUNTER — Ambulatory Visit: Payer: Medicare Other

## 2021-12-19 DIAGNOSIS — R2689 Other abnormalities of gait and mobility: Secondary | ICD-10-CM

## 2021-12-19 DIAGNOSIS — R2681 Unsteadiness on feet: Secondary | ICD-10-CM

## 2021-12-19 DIAGNOSIS — Z9181 History of falling: Secondary | ICD-10-CM

## 2021-12-19 DIAGNOSIS — M6281 Muscle weakness (generalized): Secondary | ICD-10-CM

## 2021-12-19 NOTE — Therapy (Signed)
[place] [facility] ?[address]  [address] ?[place], [place], [address] ?Phone: [phone]   Fax:  [phone] ? ?Physical Therapy Treatment ? ?Patient Details  ?Name: [name] Soto ?MRN: [id number] ?Date of Birth: [birth year] ?Referring Provider (PT): Curt BearsLirim Tonuzi, MD ? ? ?Encounter Date: 12/19/2021 ? ? PT End of Session - 12/19/21 1200   ? ? Visit Number 7   ? Number of Visits 12   ? Date for PT Re-Evaluation 01/07/22   ? Authorization Type Medicare & BCBS   ? PT Start Time 1107   ? PT Stop Time 1149   ? PT Time Calculation (min) 42 min   ? Activity Tolerance Patient tolerated treatment well;Patient limited by fatigue   ? Behavior [facility] for tasks assessed/performed   ? ?  ?  ? ?  ? ? ?Past Medical History:  ?Diagnosis Date  ? At risk for falls   ? Balance problem   ? Cerebral thrombosis with cerebral infarction [facility](HCC)   ? Diabetes mellitus without complication (HCC)   ? GERD (gastroesophageal reflux disease)   ? History of kidney stones   ? Humerus fracture   ? PROXIMAL RIGHT  ? Hypertension   ? Nausea and vomiting   ? Pancreatic lesion   ? PONV (postoperative nausea and vomiting)   ? Stroke [facility](HCC) 2012  ? ? ?Past Surgical History:  ?Procedure Laterality Date  ? CATARACT EXTRACTION Bilateral   ? CYSTOSCOPY WITH RETROGRADE PYELOGRAM, URETEROSCOPY AND STENT PLACEMENT Right 01/31/2021  ? Procedure: CYSTOSCOPY WITH RIGHT  RETROGRADE URETEROSCOPY HOLMIUM LASER AND STENT PLACEMENT;  Surgeon: Sebastian AcheManny, Theodore, MD;  [facility];  Service: Urology;  Laterality: Right;  ? EUS N/A 07/17/2015  ? Procedure: ESOPHAGEAL ENDOSCOPIC ULTRASOUND (EUS) RADIAL;  Surgeon: Willis ModenaWilliam Outlaw, MD;  [facility];  Service: Endoscopy;  Laterality: N/A;  ? FINE NEEDLE ASPIRATION N/A 07/17/2015  ? Procedure: FINE NEEDLE ASPIRATION (FNA) RADIAL;  Surgeon: Willis ModenaWilliam Outlaw, MD;  [facility];  Service: Endoscopy;  Laterality: N/A;  ? HEMATOMA DRAINED    ? HERNIA REPAIR    ?  inguinal right  ? HYDROCELE EXCISION    ? ORIF HUMERUS FRACTURE Right 09/26/2020  ? Procedure: Open reduction internal fixation right humeral periprosthetic fracture;  Surgeon: Francena HanlySupple, Kevin, MD;  [facility]: [facility];  Service: Orthopedics;  Laterality: Right;  180min  ? REVERSE SHOULDER ARTHROPLASTY Right 05/07/2016  ? Procedure: RIGHT REVERSE TOTAL SHOULDER ARTHROPLASTY;  Surgeon: Francena HanlyKevin Supple, MD;  [facility]: [facility];  Service: Orthopedics;  Laterality: Right;  ? ? ?There were no vitals filed for this visit. ? ? Subjective Assessment - 12/19/21 1109   ? ? Patient is accompained by: Family member   ? 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 Diagnostic tests per wife- recent xrays on 11/06/20 showed good healing   ? Patient Stated Goals "to be able to walk w/o a cane"   ? Currently in Pain? No/denies   ? ?  ?  ? ?  ? ? ? ? ? [facility] PT Assessment - 12/19/21 0001   ? ?  ? ROM / Strength  ? AROM / PROM / Strength Strength   ?  ? AROM  ? AROM Assessment Site Hip;Knee   ? Right/Left Hip Right;Left   ?  ? Strength  ? Right  Hip Flexion 4/5   ? Right Hip ABduction 4/5   ? Right Hip ADduction 4/5   ? Left Hip Flexion 4/5   ? Left Hip ABduction 4/5   ? Left Hip ADduction 4/5   ? Right Knee Flexion 4-/5   ? Right Knee Extension 4/5   ? Left Knee Flexion 4-/5   ? Left Knee Extension 4/5   ? ?  ?  ? ?  ? ? ? ? ? ? ? ? ? ? ? ? ? ? ? ? [facility] Adult PT Treatment/Exercise - 12/19/21 0001   ? ?  ? Exercises  ? Exercises Lumbar   ?  ? Lumbar Exercises: Seated  ? Other Seated Lumbar Exercises pallof press and trunk rotation with GTB x 10 each bil   ?  ? Knee/Hip Exercises: Aerobic  ? Recumbent Bike L2x20min   ?  ? Knee/Hip Exercises: Standing  ? Hip Abduction Stengthening;Both;10 reps;Knee straight   ? Hip Extension Stengthening;Both;10 reps;Limitations   ? Other Standing Knee Exercises alt toe clears to  treadmill floor x 20 bil   ?  ? Knee/Hip Exercises: Seated  ? Ball Squeeze 10x3"   ? ?  ?  ? ?  ? ? ? ? ? ? Balance Exercises - 12/19/21 0001   ? ?  ? Balance Exercises: Standing  ? Stepping Strategy Lateral;Posterior;10 reps   with UE support; 10 reps each way  ? ?  ?  ? ?  ? ? ? ? ? ? ? PT Short Term Goals - 12/12/21 1200   ? ?  ? PT SHORT TERM GOAL #1  ? Title Patient will be independent with initial HEP including [employer] Program at a supported level   ? Status On-going   pt reported partial compliance; 12/12/21  ? Target Date 12/17/21   ? ?  ?  ? ?  ? ? ? ? PT Long Term Goals - 12/19/21 1124   ? ?  ? PT LONG TERM GOAL #1  ? Title Patient will be independent with ongoing/advanced HEP +/- [place] Fall Prevention Program for self-management at home   ? Status On-going   ? Target Date 01/07/22   ?  ? PT LONG TERM GOAL #2  ? Title Patient will demonstrate improved B proximal LE strength to >/= 4+/5 for improved stability and ease of mobility   ? Status On-going   ? Target Date 01/07/22   ?  ? PT LONG TERM GOAL #3  ? Title Patient will improve Berg score to >/= 45/56 to improve safety and stability with ADLs in standing and reduce risk for falls   ? Baseline 34/56 (11/26/21)   ? Status On-going   ? Target Date 01/07/22   ?  ? PT LONG TERM GOAL #4  ? Title Patient will demonstrate decreased TUG time to </= 18 sec to decrease risk for falls with transitional mobility   ? Baseline 25.62 sec with SPC, 27.44 sec w/o AD (11/26/21)   ? Status On-going   ? Target Date 01/07/22   ?  ? PT LONG TERM GOAL #5  ? Title Patient will ambulate with improved gait pattern and good gait stability with LRAD   ? Status On-going   ? Target Date 01/07/22   ?  ? PT LONG TERM GOAL #6  ? Title Patient will improve DGI score to >/= 16/24 to improve gait stability and reduce risk for falls   ? Baseline 7/24 (12/02/21)   ?  Status On-going  ? Target Date 01/07/22   ? ?  ?  ? ?  ? ? ? ? ? ? ? ? Plan - 12/19/21 1201   ? ? Clinical Impression  Statement Pt responded well to treatment today. LE MMT shows not much improvement. Postural cuing and cues to prevent any compensation with standing TE was required. Pt still noting partial compliance with HEP, which may contribute to the results from the MMT. Close supervision and lots of instruction required during session. Continue progressing exercises.   ? Personal Factors and Comorbidities Age;Behavior Pattern;Comorbidity 3+;Past/Current Experience;Time since onset of injury/illness/exacerbation;Transportation   ? 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 PT Frequency 2x / week   ? PT Duration 6 weeks   ? PT Treatment/Interventions ADLs/Self Care Home Management;DME Instruction;Gait training;Stair training;Functional mobility training;Therapeutic activities;Therapeutic exercise;Balance training;Neuromuscular re-education;Patient/family education;Manual techniques   ? PT Next Visit Plan Complete instruction in [place] fall prevention program and address any questions/concerns from initial instruction;  LE strengthening and balance   ? PT Home Exercise Plan [facility] Prevention Program   ? Consulted and Agree with Plan of Care Patient;Family member/caregiver   ? Family Member Consulted wife - Kathie Rhodes   ? ?  ?  ? ?  ? ? ?Patient will benefit from skilled therapeutic intervention in order to improve the following deficits and impairments:  Abnormal gait, Decreased activity tolerance, Decreased balance, Decreased endurance, Decreased knowledge of precautions, Decreased knowledge of use of DME, Decreased mobility, Decreased safety awareness, Decreased strength, Impaired perceived functional ability, Improper body mechanics, Postural dysfunction, Pain ? ?Visit Diagnosis: ?Unsteadiness on feet ? ?Other abnormalities of gait and mobility ? ?History of falling ? ?Muscle weakness  (generalized) ? ? ? ? ?Problem List ?Patient Active Problem List  ? Diagnosis Date Noted  ? Acute kidney injury (HCC) 01/31/2021  ? Right ureteral stone 01/31/2021  ? Essential hypertension 01/31/2021  ? GERD with

## 2021-12-23 ENCOUNTER — Ambulatory Visit: Payer: Medicare Other | Admitting: Physical Therapy

## 2021-12-23 ENCOUNTER — Encounter: Payer: Self-pay | Admitting: Physical Therapy

## 2021-12-23 DIAGNOSIS — M6281 Muscle weakness (generalized): Secondary | ICD-10-CM

## 2021-12-23 DIAGNOSIS — R2681 Unsteadiness on feet: Secondary | ICD-10-CM

## 2021-12-23 DIAGNOSIS — Z9181 History of falling: Secondary | ICD-10-CM

## 2021-12-23 DIAGNOSIS — R2689 Other abnormalities of gait and mobility: Secondary | ICD-10-CM

## 2021-12-23 NOTE — Therapy (Signed)
[place] ?[facility] ?[address] ?[place], [place], [address] ?Phone: [phone]   Fax:  [phone] ? ?Physical Therapy Treatment ? ?Patient Details  ?Name: [name] Soto ?MRN: [id number] ?Date of Birth: 1946/09/06 ?Referring Provider (PT): Karl Luke, MD ? ? ?Encounter Date: 12/23/2021 ? ? PT End of Session - 12/23/21 1107   ? ? Visit Number 8   ? Number of Visits 12   ? Date for PT Re-Evaluation 01/07/22   ? Authorization Type Medicare & BCBS   ? PT Start Time 1107   ? PT Stop Time 1151   ? PT Time Calculation (min) 44 min   ? Activity Tolerance Patient tolerated treatment well;Patient limited by fatigue   ? Behavior During Therapy [facility] for tasks assessed/performed   ? ?  ?  ? ?  ? ? ?Past Medical History:  ?Diagnosis Date  ? At risk for falls   ? Balance problem   ? Cerebral thrombosis with cerebral infarction [facility])   ? Diabetes mellitus without complication ([place])   ? GERD (gastroesophageal reflux disease)   ? History of kidney stones   ? Humerus fracture   ? PROXIMAL RIGHT  ? Hypertension   ? Nausea and vomiting   ? Pancreatic lesion   ? PONV (postoperative nausea and vomiting)   ? Stroke [facility]) 2012  ? ? ?Past Surgical History:  ?Procedure Laterality Date  ? CATARACT EXTRACTION Bilateral   ? CYSTOSCOPY WITH RETROGRADE PYELOGRAM, URETEROSCOPY AND STENT PLACEMENT Right 01/31/2021  ? Procedure: CYSTOSCOPY WITH RIGHT  RETROGRADE URETEROSCOPY HOLMIUM LASER AND STENT PLACEMENT;  Surgeon: Alexis Frock, MD;  [facility];  Service: Urology;  Laterality: Right;  ? EUS N/A 07/17/2015  ? Procedure: ESOPHAGEAL ENDOSCOPIC ULTRASOUND (EUS) RADIAL;  Surgeon: Arta Silence, MD;  [facility];  Service: Endoscopy;  Laterality: N/A;  ? FINE NEEDLE ASPIRATION N/A 07/17/2015  ? Procedure: FINE NEEDLE ASPIRATION (FNA) RADIAL;  Surgeon: Arta Silence, MD;  [facility];  Service: Endoscopy;  Laterality: N/A;  ? HEMATOMA DRAINED    ? HERNIA REPAIR     ? inguinal right  ? HYDROCELE EXCISION    ? ORIF HUMERUS FRACTURE Right 09/26/2020  ? Procedure: Open reduction internal fixation right humeral periprosthetic fracture;  Surgeon: Justice Britain, MD;  [facility];  Service: Orthopedics;  Laterality: Right;  113min  ? REVERSE SHOULDER ARTHROPLASTY Right 05/07/2016  ? Procedure: RIGHT REVERSE TOTAL SHOULDER ARTHROPLASTY;  Surgeon: Justice Britain, MD;  Location: [facility];  Service: Orthopedics;  Laterality: Right;  ? ? ?There were no vitals filed for this visit. ? ? Subjective Assessment - 12/23/21 1107   ? ? Subjective Pt denies any new falls. No pain today.   ? Patient is accompained by: Family member   ? 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 Diagnostic tests per wife- recent xrays on 11/06/20 showed good healing   ? Patient Stated Goals "to be able to walk w/o a cane"   ? Currently in Pain? No/denies   ? ?  ?  ? ?  ? ? ? ? ? ? ? ? ? ? ? ? ? ? ? ? ? ? ? ? [facility] Adult PT Treatment/Exercise - 12/23/21 0001   ? ?  ? Ambulation/Gait  ? Ambulation/Gait Assistance 5: Supervision;4: Min guard  close SBA  ? Ambulation/Gait Assistance Details cues for posture and heel strike   ? Ambulation Distance (Feet) 600 Feet   ? Assistive device Straight cane   ? Gait Comments small LOB x 2 recovered without assistance   ?  ? High Level Balance  ? High Level Balance Activities Negotiating over obstacles;Negotitating around obstacles;Figure 8 turns;Direction changes   ? High Level Balance Comments R/L fwd/back single leg step-over 1/2 FR x 10 targeting heel strike on wt acceptance (counter support + SPC); B lateral step-over 1/2 FR x 10 (B UE counter support); figure-8 walk around 2 cones with SPC; box step around single cone with SPC x 5   close SBA for all activities  ?  ? Knee/Hip Exercises: Standing  ? Other Standing Knee Exercises alt toe  clears to 6" step x 10 with SPC and single UE support on counter weaning to 2 finger support; alt toe clears to 4" x 5 with only SPC support   ? ?  ?  ? ?  ? ? ? ? ? ? ? ? ? ? ? ? PT Short Term Goals - 12/23/21 1127   ? ?  ? PT SHORT TERM GOAL #1  ? Title Patient will be independent with initial HEP including [employer] Program at a supported level   ? Status Achieved   12/23/21 - independent with highlighted exercises/activities but admits to limited compliance due to poor motivation  ? Target Date 12/17/21   ? ?  ?  ? ?  ? ? ? ? PT Long Term Goals - 12/23/21 1130   ? ?  ? PT LONG TERM GOAL #1  ? Title Patient will be independent with ongoing/advanced HEP +/- [place] Fall Prevention Program for self-management at home   ? Status On-going   ? Target Date 01/07/22   ?  ? PT LONG TERM GOAL #2  ? Title Patient will demonstrate improved B proximal LE strength to >/= 4+/5 for improved stability and ease of mobility   ? Status On-going   ? Target Date 01/07/22   ?  ? PT LONG TERM GOAL #3  ? Title Patient will improve Berg score to >/= 45/56 to improve safety and stability with ADLs in standing and reduce risk for falls   ? Baseline 34/56 (11/26/21)   ? Status On-going   ? Target Date 01/07/22   ?  ? PT LONG TERM GOAL #4  ? Title Patient will demonstrate decreased TUG time to </= 18 sec to decrease risk for falls with transitional mobility   ? Baseline 25.62 sec with SPC, 27.44 sec w/o AD (11/26/21)   ? Status On-going   ? Target Date 01/07/22   ?  ? PT LONG TERM GOAL #5  ? Title Patient will ambulate with improved gait pattern and good gait stability with LRAD   ? Status On-going   ? Target Date 01/07/22   ?  ? PT LONG TERM GOAL #6  ? Title Patient will improve DGI score to >/= 16/24 to improve gait stability and reduce risk for falls   ? Baseline 7/24 (12/02/21)   ? Status On-going   ? Target Date 01/07/22   ? ?  ?  ? ?  ? ? ? ? ? ? ? ? Plan - 12/23/21 1151   ? ? Clinical Impression Statement [name] Soto remains very  unsteady with gait with SPC requiring close SBA during therapy sessions but denies any new falls. Cues necessary  for increased foot clearance and heel strike on weight acceptance with therapeutic exercises and activities reinforcing these patterns but limited carryover during gait w/o cues. Worked on balance with turning and multidirectional stepping with close SBA to CGA at times due to unsteadiness. Pt typically seems to have more difficulty with activities/stepping with R foot but when switching cane to L hand suggested, pt reports a previous PT had attempted this with him and eventually had him revert to the R hand. Progress with PT remains slow, in part due to limited compliance with HEP.   ? 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 Rehab Potential Good   ? PT Frequency 2x / week   ? PT Duration 6 weeks   ? PT Treatment/Interventions ADLs/Self Care Home Management;DME Instruction;Gait training;Stair training;Functional mobility training;Therapeutic activities;Therapeutic exercise;Balance training;Neuromuscular re-education;Patient/family education;Manual techniques   ? PT Next Visit Plan Progress core/LE strengthening and balance; Review/update [place] fall prevention program PRN   ? PT Home Exercise Plan [facility] Prevention Program   ? Consulted and Agree with Plan of Care Patient;Family member/caregiver   ? Family Member Consulted wife - [name] Soto   ? ?  ?  ? ?  ? ? ?Patient will benefit from skilled therapeutic intervention in order to improve the following deficits and impairments:  Abnormal gait, Decreased activity tolerance, Decreased balance, Decreased endurance, Decreased knowledge of precautions, Decreased knowledge of use of DME, Decreased mobility, Decreased safety awareness, Decreased strength, Impaired perceived functional ability, Improper body mechanics, Postural  dysfunction, Pain ? ?Visit Diagnosis: ?Unsteadiness on feet ? ?Other abnormalities of gait and mobility ? ?History of falling ? ?Muscle weakness (generalized) ? ? ? ? ?Problem List ?Patient Active Problem List  ?

## 2021-12-25 ENCOUNTER — Ambulatory Visit: Payer: Medicare Other

## 2021-12-25 DIAGNOSIS — R2689 Other abnormalities of gait and mobility: Secondary | ICD-10-CM

## 2021-12-25 DIAGNOSIS — M6281 Muscle weakness (generalized): Secondary | ICD-10-CM

## 2021-12-25 DIAGNOSIS — R2681 Unsteadiness on feet: Secondary | ICD-10-CM

## 2021-12-25 DIAGNOSIS — Z9181 History of falling: Secondary | ICD-10-CM

## 2021-12-25 NOTE — Therapy (Signed)
[place] [facility] ?[address]  [address] ?[place], [place], [address] ?Phone: [phone]   Fax:  [phone] ? ?Physical Therapy Treatment ? ?Patient Details  ?Name: [name] Soto ?MRN: [id number] ?Date of Birth: 01/03/1946 ?Referring Provider (PT): Curt Bears, MD ? ? ?Encounter Date: 12/25/2021 ? ? PT End of Session - 12/25/21 1359   ? ? Visit Number 9   ? Number of Visits 12   ? Date for PT Re-Evaluation 01/07/22   ? Authorization Type Medicare & BCBS   ? PT Start Time 1316   ? PT Stop Time 1358   ? PT Time Calculation (min) 42 min   ? Activity Tolerance Patient tolerated treatment well;Patient limited by fatigue   ? Behavior During Therapy [facility] for tasks assessed/performed   ? ?  ?  ? ?  ? ? ?Past Medical History:  ?Diagnosis Date  ? At risk for falls   ? Balance problem   ? Cerebral thrombosis with cerebral infarction [facility])   ? Diabetes mellitus without complication (HCC)   ? GERD (gastroesophageal reflux disease)   ? History of kidney stones   ? Humerus fracture   ? PROXIMAL RIGHT  ? Hypertension   ? Nausea and vomiting   ? Pancreatic lesion   ? PONV (postoperative nausea and vomiting)   ? Stroke [facility]) 2012  ? ? ?Past Surgical History:  ?Procedure Laterality Date  ? CATARACT EXTRACTION Bilateral   ? CYSTOSCOPY WITH RETROGRADE PYELOGRAM, URETEROSCOPY AND STENT PLACEMENT Right 01/31/2021  ? Procedure: CYSTOSCOPY WITH RIGHT  RETROGRADE URETEROSCOPY HOLMIUM LASER AND STENT PLACEMENT;  Surgeon: [name], MD;  [facility]: [facility];  Service: Urology;  Laterality: Right;  ? EUS N/A 07/17/2015  ? Procedure: ESOPHAGEAL ENDOSCOPIC ULTRASOUND (EUS) RADIAL;  Surgeon: Willis Modena, MD;  [facility];  Service: Endoscopy;  Laterality: N/A;  ? FINE NEEDLE ASPIRATION N/A 07/17/2015  ? Procedure: FINE NEEDLE ASPIRATION (FNA) RADIAL;  Surgeon: Willis Modena, MD;  [facility];  Service: Endoscopy;  Laterality: N/A;  ? HEMATOMA DRAINED    ? HERNIA REPAIR     ? inguinal right  ? HYDROCELE EXCISION    ? ORIF HUMERUS FRACTURE Right 09/26/2020  ? Procedure: Open reduction internal fixation right humeral periprosthetic fracture;  Surgeon: Francena Hanly, MD;  [facility];  Service: Orthopedics;  Laterality: Right;   ? REVERSE SHOULDER ARTHROPLASTY Right 05/07/2016  ? Procedure: RIGHT REVERSE TOTAL SHOULDER ARTHROPLASTY;  Surgeon: Francena Hanly, MD;  [facility];  Service: Orthopedics;  Laterality: Right;  ? ? ?There were no vitals filed for this visit. ? ? Subjective Assessment - 12/25/21 1314   ? ? Subjective Have been trying to do more exercises at home, no pain.   ? 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 Diagnostic tests per wife- recent xrays on 11/06/20 showed good healing   ? Patient Stated Goals "to be able to walk w/o a cane"   ? Currently in Pain? No/denies   ? ?  ?  ? ?  ? ? ? ? ? ? ? ? ? ? ? ? ? ? ? ? ? ? ? ? [facility] Adult PT Treatment/Exercise - 12/25/21 0001   ? ?  ? Ambulation/Gait  ? Ambulation/Gait Assistance 4: Min guard;5: Supervision   ? Ambulation Distance (Feet) 180  Feet   ? Assistive device Straight cane   ? Gait Comments LOB x 2 but able to self correct; cues required at times to improve heel strike   ?  ? Lumbar Exercises: Standing  ? Scapular Retraction AROM;Strengthening;Both;20 reps   standing on airex pad  ?  ? Knee/Hip Exercises: Aerobic  ? Recumbent Bike L3x24min   ?  ? Knee/Hip Exercises: Seated  ? Long [employer] Strengthening;Both;15 reps;Weights;2 sets   ? Long Arc Quad Weight 2 lbs.   ? Marching Strengthening;Both;15 reps;Weights;2 sets   ? Marching Weights 2 lbs.   ? ?  ?  ? ?  ? ? ? ? ? ? Balance Exercises - 12/25/21 0001   ? ?  ? Balance Exercises: Standing  ? Standing Eyes Opened Narrow base of support (BOS);Foam/compliant surface;2 reps;30 secs   ? Marching Foam/compliant surface;Upper  extremity assist 2;Static;20 reps   ? Other Standing Exercises fwd reaching standing on airex R/L UE x 10 with various targets   ? ?  ?  ? ?  ? ? ? ? ? ? ? PT Short Term Goals - 12/23/21 1127   ? ?  ? PT SHORT TERM GOAL #1  ? Title Patient will be independent with initial HEP including [employer] Program at a supported level   ? Status Achieved   12/23/21 - independent with highlighted exercises/activities but admits to limited compliance due to poor motivation  ? Target Date 12/17/21   ? ?  ?  ? ?  ? ? ? ? PT Long Term Goals - 12/25/21 1353   ? ?  ? PT LONG TERM GOAL #1  ? Title Patient will be independent with ongoing/advanced HEP +/- [place] Fall Prevention Program for self-management at home   ? Status On-going   ? Target Date 01/07/22   ?  ? PT LONG TERM GOAL #2  ? Title Patient will demonstrate improved B proximal LE strength to >/= 4+/5 for improved stability and ease of mobility   ? Status On-going   ? Target Date 01/07/22   ?  ? PT LONG TERM GOAL #3  ? Title Patient will improve Berg score to >/= 45/56 to improve safety and stability with ADLs in standing and reduce risk for falls   ? Baseline 34/56 (11/26/21)   ? Status On-going   ? Target Date 01/07/22   ?  ? PT LONG TERM GOAL #4  ? Title Patient will demonstrate decreased TUG time to </= 18 sec to decrease risk for falls with transitional mobility   ? Baseline 25.62 sec with SPC, 27.44 sec w/o AD (11/26/21)   ? Status On-going   ? Target Date 01/07/22   ?  ? PT LONG TERM GOAL #5  ? Title Patient will ambulate with improved gait pattern and good gait stability with LRAD   ? Status On-going   12/25/21- pt shows improved gait pattern, but cues required at times to increase heel strike and LOB x 2 today  ? Target Date 01/07/22   ?  ? PT LONG TERM GOAL #6  ? Title Patient will improve DGI score to >/= 16/24 to improve gait stability and reduce risk for falls   ? Baseline 7/24 (12/02/21)   ? Status On-going   ? Target Date 01/07/22   ? ?  ?  ? ?   ? ? ? ? ? ? ? ? Plan - 12/25/21 1403   ? ? Clinical Impression Statement Pt  demonstrates difficulty with static balance on compliant surfaces. Gait shows to be improving but with cues needed to increase heel strike and LOB x 2 but able to self recover. He also had some LOB while standing on airex with shoulder squeezes and reaching exercises. Tactile and verbal cuing through session given for safety and postural stability. Making progress with LTG #5, HEP compliance still remains the same.   ? Personal Factors and Comorbidities Age;Behavior Pattern;Comorbidity 3+;Past/Current Experience;Time since onset of injury/illness/exacerbation;Transportation   ? 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 PT Frequency 2x / week   ? PT Duration 6 weeks   ? PT Treatment/Interventions ADLs/Self Care Home Management;DME Instruction;Gait training;Stair training;Functional mobility training;Therapeutic activities;Therapeutic exercise;Balance training;Neuromuscular re-education;Patient/family education;Manual techniques   ? PT Next Visit Plan Progress core/LE strengthening and balance; Review/update [place] fall prevention program PRN   ? PT Home Exercise Plan [facility]   ? Consulted and Agree with Plan of Care Patient;Family member/caregiver   ? ?  ?  ? ?  ? ? ?Patient will benefit from skilled therapeutic intervention in order to improve the following deficits and impairments:  Abnormal gait, Decreased activity tolerance, Decreased balance, Decreased endurance, Decreased knowledge of precautions, Decreased knowledge of use of DME, Decreased mobility, Decreased safety awareness, Decreased strength, Impaired perceived functional ability, Improper body mechanics, Postural dysfunction, Pain ? ?Visit Diagnosis: ?Unsteadiness on feet ? ?Other abnormalities of gait and  mobility ? ?History of falling ? ?Muscle weakness (generalized) ? ? ? ? ?Problem List ?Patient Active Problem List  ? Diagnosis Date Noted  ? Acute kidney injury (HCC) 01/31/2021  ? Right ureteral stone 01/31/2021  ? Essential hype

## 2021-12-30 ENCOUNTER — Encounter: Payer: Medicare Other | Admitting: Physical Therapy

## 2022-01-01 ENCOUNTER — Encounter: Payer: Self-pay | Admitting: Physical Therapy

## 2022-01-01 ENCOUNTER — Ambulatory Visit: Payer: Medicare Other | Admitting: Physical Therapy

## 2022-01-01 DIAGNOSIS — R2681 Unsteadiness on feet: Secondary | ICD-10-CM | POA: Diagnosis not present

## 2022-01-01 DIAGNOSIS — R2689 Other abnormalities of gait and mobility: Secondary | ICD-10-CM

## 2022-01-01 DIAGNOSIS — M6281 Muscle weakness (generalized): Secondary | ICD-10-CM

## 2022-01-01 DIAGNOSIS — Z9181 History of falling: Secondary | ICD-10-CM

## 2022-01-01 NOTE — Therapy (Addendum)
[place] [facility] ?[address]  [address] ?[place], [place], [address] ?Phone: [phone]   Fax:  [phone] ? ?Physical Therapy Treatment / Progress Note ? ?Patient Details  ?Name: [name] Soto ?MRN: [id number] ?Date of Birth: [birth year] ?Referring Provider (PT): Curt Bears, MD ? ?Progress Note ? ?Reporting Period 11/26/2021 to 01/01/2022 ? ?See note below for Objective Data and Assessment of Progress/Goals.  ? ? ? ?Encounter Date: 01/01/2022 ? ? PT End of Session - 01/01/22 1018   ? ? Visit Number 10   ? Number of Visits 12   ? Date for PT Re-Evaluation 01/07/22   ? Authorization Type Medicare & BCBS   ? PT Start Time 1018   ? PT Stop Time 1108   ? PT Time Calculation (min) 50 min   ? Activity Tolerance Patient tolerated treatment well;Patient limited by fatigue   ? Behavior During Therapy [facility] for tasks assessed/performed   ? ?  ?  ? ?  ? ? ?Past Medical History:  ?Diagnosis Date  ? At risk for falls   ? Balance problem   ? Cerebral thrombosis with cerebral infarction [facility])   ? Diabetes mellitus without complication (HCC)   ? GERD (gastroesophageal reflux disease)   ? History of kidney stones   ? Humerus fracture   ? PROXIMAL RIGHT  ? Hypertension   ? Nausea and vomiting   ? Pancreatic lesion   ? PONV (postoperative nausea and vomiting)   ? Stroke [facility]) 2012  ? ? ?Past Surgical History:  ?Procedure Laterality Date  ? CATARACT EXTRACTION Bilateral   ? CYSTOSCOPY WITH RETROGRADE PYELOGRAM, URETEROSCOPY AND STENT PLACEMENT Right 01/31/2021  ? Procedure: CYSTOSCOPY WITH RIGHT  RETROGRADE URETEROSCOPY HOLMIUM LASER AND STENT PLACEMENT;  Surgeon: [name], MD;  [facility];  Service: Urology;  Laterality: Right;  ? EUS N/A 07/17/2015  ? Procedure: ESOPHAGEAL ENDOSCOPIC ULTRASOUND (EUS) RADIAL;  Surgeon: Willis Modena, MD;  [facility];  Service: Endoscopy;  Laterality: N/A;  ? FINE NEEDLE ASPIRATION N/A 07/17/2015  ? Procedure: FINE NEEDLE ASPIRATION  (FNA) RADIAL;  Surgeon: Willis Modena, MD;  [facility];  Service: Endoscopy;  Laterality: N/A;  ? HEMATOMA DRAINED    ? HERNIA REPAIR    ? inguinal right  ? HYDROCELE EXCISION    ? ORIF HUMERUS FRACTURE Right 09/26/2020  ? Procedure: Open reduction internal fixation right humeral periprosthetic fracture;  Surgeon: Francena Hanly, MD;  [facility];  Service: Orthopedics;  Laterality: Right;   ? REVERSE SHOULDER ARTHROPLASTY Right 05/07/2016  ? Procedure: RIGHT REVERSE TOTAL SHOULDER ARTHROPLASTY;  Surgeon: Francena Hanly, MD;  [facility];  Service: Orthopedics;  Laterality: Right;  ? ? ?There were no vitals filed for this visit. ? ? Subjective Assessment - 01/01/22 1023   ? ? Subjective Pt denies pain today but states his legs are tired. He denies any recent falls and feels that his balance is improving.   ? 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 Patient Stated Goals "to be able to walk w/o a cane"   ? Currently in Pain? No/denies   ? ?  ?  ? ?  ? ? ? ? ? [facility] PT Assessment - 01/01/22 1018   ? ?  ? Assessment  ? Medical Diagnosis Imbalance   ? Referring  Provider (PT) Curt BearsLirim Tonuzi, MD   ? Onset Date/Surgical Date --   progressively worseneing over past few years  ? Next MD Visit 02/14/22   ?  ? Ambulation/Gait  ? Ambulation/Gait Assistance 5: Supervision;4: Min guard   close SBA  ? Ambulation/Gait Assistance Details cues for posture and heel strike   ? Ambulation Distance (Feet) 220 Feet   ? Assistive device Straight cane   ? Gait Pattern Wide base of support;Decreased dorsiflexion - right;Decreased dorsiflexion - left;Decreased hip/knee flexion - right;Decreased hip/knee flexion - left;Poor foot clearance - right;Decreased stride length;Poor foot clearance - left;Right foot flat;Left foot flat   ? Gait velocity 1.96 ft/sec   ?  ? Standardized Balance  Assessment  ? Five times sit to stand comments  21.69 sec w/o UE assist   ? 10 Meter Walk 16.75 sec with SPC   ?  ? Berg Balance Test  ? Sit to Stand Able to stand without using hands and stabilize independently   ? Standing Unsupported Able to stand safely 2 minutes   ? Sitting with Back Unsupported but Feet Supported on Floor or Stool Able to sit safely and securely 2 minutes   ? Stand to Sit Sits safely with minimal use of hands   ? Transfers Able to transfer safely, minor use of hands   ? Standing Unsupported with Eyes Closed Able to stand 10 seconds safely   ? Standing Unsupported with Feet Together Able to place feet together independently and stand for 1 minute with supervision   ? From Standing, Reach Forward with Outstretched Arm Can reach forward >12 cm safely (5")   ? From Standing Position, Pick up Object from Floor Able to pick up shoe safely and easily   ? From Standing Position, Turn to Look Behind Over each Shoulder Turn sideways only but maintains balance   ? Turn 360 Degrees Able to turn 360 degrees safely but slowly   ? Standing Unsupported, Alternately Place Feet on Step/Stool Needs assistance to keep from falling or unable to try   ? Standing Unsupported, One Foot in Front Able to take small step independently and hold 30 seconds   ? Standing on One Leg Tries to lift leg/unable to hold 3 seconds but remains standing independently   ? Total Score 41   ? Berg comment: 37-45 = Significant (>80%) fall risk   ?  ? Dynamic Gait Index  ? Level Surface Mild Impairment   ? Change in Gait Speed Moderate Impairment   ? Gait with Horizontal Head Turns Moderate Impairment   ? Gait with Vertical Head Turns Moderate Impairment   ? Gait and Pivot Turn Moderate Impairment   ? Step Over Obstacle Moderate Impairment   ? Step Around Obstacles Mild Impairment   ? Steps Moderate Impairment   ? Total Score 10   ? DGI comment: Scores of 19 or less are predictive of falls in older community living adults   ?  ? Timed Up  and Go Test  ? Normal TUG (seconds) 27.21   with SPC; 29.41 sec w/o AD  ? ?  ?  ? ?  ? ? ? ? ? ? ? ? ? ? ? ? ? ? ? ? [facility] Adult PT Treatment/Exercise - 01/01/22 1018   ? ?  ? Knee/Hip Exercises: Aerobic  ? Nustep L6 x 6 min (UE/LE)   ? ?  ?  ? ?  ? ? ? ? ? ? ? ? ? ? ? ?  PT Short Term Goals - 12/23/21 1127   ? ?  ? PT SHORT TERM GOAL #1  ? Title Patient will be independent with initial HEP including [employer] Program at a supported level   ? Status Achieved   12/23/21 - independent with highlighted exercises/activities but admits to limited compliance due to poor motivation  ? Target Date 12/17/21   ? ?  ?  ? ?  ? ? ? ? PT Long Term Goals - 01/01/22 1102   ? ?  ? PT LONG TERM GOAL #1  ? Title Patient will be independent with ongoing/advanced HEP +/- [place] Fall Prevention Program for self-management at home   ? Status On-going   ? Target Date 01/07/22   ?  ? PT LONG TERM GOAL #2  ? Title Patient will demonstrate improved B proximal LE strength to >/= 4+/5 for improved stability and ease of mobility   ? Status On-going   ? Target Date 01/07/22   ?  ? PT LONG TERM GOAL #3  ? Title Patient will improve Berg score to >/= 45/56 to improve safety and stability with ADLs in standing and reduce risk for falls   ? Baseline 34/56 (11/26/21); 41/56 (01/01/22)   ? Status On-going   ? Target Date 01/07/22   ?  ? PT LONG TERM GOAL #4  ? Title Patient will demonstrate decreased TUG time to </= 18 sec to decrease risk for falls with transitional mobility   ? Baseline 25.62 sec with SPC, 27.44 sec w/o AD (11/26/21);  27.21 sec with SPC; 29.41 sec w/o AD (01/01/22)   ? Status On-going   ? Target Date 01/07/22   ?  ? PT LONG TERM GOAL #5  ? Title Patient will ambulate with improved gait pattern and good gait stability with LRAD   ? Status On-going   01/01/22- pt shows somewhat improved gait pattern with improved gait speed to 1.96 ft/sec, but inconsistent with several bouts of instability and/or minor LOB still evident  ? Target  Date 01/07/22   ?  ? PT LONG TERM GOAL #6  ? Title Patient will improve DGI score to >/= 16/24 to improve gait stability and reduce risk for falls   ? Baseline 7/24 (12/02/21); 10/24 (01/01/22)   ? Status

## 2022-01-06 ENCOUNTER — Encounter: Payer: Self-pay | Admitting: Physical Therapy

## 2022-01-06 ENCOUNTER — Ambulatory Visit: Payer: Medicare Other | Admitting: Physical Therapy

## 2022-01-06 DIAGNOSIS — M6281 Muscle weakness (generalized): Secondary | ICD-10-CM

## 2022-01-06 DIAGNOSIS — R2681 Unsteadiness on feet: Secondary | ICD-10-CM | POA: Diagnosis not present

## 2022-01-06 DIAGNOSIS — R2689 Other abnormalities of gait and mobility: Secondary | ICD-10-CM

## 2022-01-06 DIAGNOSIS — Z9181 History of falling: Secondary | ICD-10-CM

## 2022-01-06 NOTE — Therapy (Signed)
[place] [facility] ?[place] ?[place], [place], [address] ?Phone: [phone]   Fax:  [phone] ? ?Physical Therapy Treatment / Recert ? ?Patient Details  ?Name: [name] Soto ?MRN: [id number] ?Date of Birth: 07-28-1946 ?Referring Provider (PT): Karl Luke, MD ? ? ?Encounter Date: 01/06/2022 ? ? PT End of Session - 01/06/22 1356   ? ? Visit Number 11   ? Number of Visits 23   ? Date for PT Re-Evaluation 02/17/22   ? Authorization Type Medicare & BCBS   ? Progress Note Due on Visit 21   Recert on visit #43 - 1/54/00  ? PT Start Time 1356   ? PT Stop Time 8676   ? PT Time Calculation (min) 47 min   ? Activity Tolerance Patient tolerated treatment well;Patient limited by fatigue   ? Behavior During Therapy [facility] for tasks assessed/performed   ? ?  ?  ? ?  ? ? ?Past Medical History:  ?Diagnosis Date  ? At risk for falls   ? Balance problem   ? Cerebral thrombosis with cerebral infarction [facility])   ? Diabetes mellitus without complication ([place])   ? GERD (gastroesophageal reflux disease)   ? History of kidney stones   ? Humerus fracture   ? PROXIMAL RIGHT  ? Hypertension   ? Nausea and vomiting   ? Pancreatic lesion   ? PONV (postoperative nausea and vomiting)   ? Stroke [facility]) 2012  ? ? ?Past Surgical History:  ?Procedure Laterality Date  ? CATARACT EXTRACTION Bilateral   ? CYSTOSCOPY WITH RETROGRADE PYELOGRAM, URETEROSCOPY AND STENT PLACEMENT Right 01/31/2021  ? Procedure: CYSTOSCOPY WITH RIGHT  RETROGRADE URETEROSCOPY HOLMIUM LASER AND STENT PLACEMENT;  Surgeon: Alexis Frock, MD;  [facility];  Service: Urology;  Laterality: Right;  ? EUS N/A 07/17/2015  ? Procedure: ESOPHAGEAL ENDOSCOPIC ULTRASOUND (EUS) RADIAL;  Surgeon: Arta Silence, MD;  [facility];  Service: Endoscopy;  Laterality: N/A;  ? FINE NEEDLE ASPIRATION N/A 07/17/2015  ? Procedure: FINE NEEDLE ASPIRATION (FNA) RADIAL;  Surgeon: Arta Silence, MD;  [facility];   Service: Endoscopy;  Laterality: N/A;  ? HEMATOMA DRAINED    ? HERNIA REPAIR    ? inguinal right  ? HYDROCELE EXCISION    ? ORIF HUMERUS FRACTURE Right 09/26/2020  ? Procedure: Open reduction internal fixation right humeral periprosthetic fracture;  Surgeon: Justice Britain, MD;  [facility];  Service: Orthopedics;  Laterality: Right;  127mn  ? REVERSE SHOULDER ARTHROPLASTY Right 05/07/2016  ? Procedure: RIGHT REVERSE TOTAL SHOULDER ARTHROPLASTY;  Surgeon: KJustice Britain MD;  [facility]  Service: Orthopedics;  Laterality: Right;  ? ? ?There were no vitals filed for this visit. ? ? Subjective Assessment - 01/06/22 1402   ? ? Subjective Pt reports his wife was in agreement with extending his PT and has scheduled more PT visits for him.   ? 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 Patient Stated Goals "to be able to walk w/o a cane"   ? Currently in Pain? No/denies   ? ?  ?  ? ?  ? ? ? ? ? [facility] PT Assessment - 01/06/22 1356   ? ?  ? Assessment  ? Medical Diagnosis Imbalance   ? Referring Provider (PT) LKarl Luke MD   ? Onset Date/Surgical  Date --   progressively worseneing over past few years  ? Next MD Visit 02/14/22   ?  ? Prior Function  ? Level of Independence Independent   ? Vocation Retired   ? Leisure mostly sedentary   ?  ? Strength  ? Right Hip Flexion 4+/5   ? Right Hip Extension 4/5   ? Right Hip External Rotation  4/5   ? Right Hip Internal Rotation 4+/5   ? Right Hip ABduction 4+/5   ? Right Hip ADduction 4/5   ? Left Hip Flexion 4/5   ? Left Hip Extension 4-/5   ? Left Hip External Rotation 4-/5   ? Left Hip Internal Rotation 4/5   ? Left Hip ABduction 4+/5   ? Left Hip ADduction 4/5   ? Right Knee Flexion 4+/5   ? Right Knee Extension 4+/5   ? Left Knee Flexion 4+/5   ? Left Knee Extension 4+/5   ? Right Ankle Dorsiflexion 4+/5   ? Right Ankle Plantar  Flexion 4/5   12 SLS heel raises with UE support  ? Left Ankle Dorsiflexion 4+/5   ? Left Ankle Plantar Flexion 3+/5   4 SLS heel raises with UE support  ?  ? Ambulation/Gait  ? Gait velocity 1.96 ft/sec   ?  ? Standardized Balance Assessment  ? Standardized Balance Assessment --   testing as of 01/01/22  ? Five times sit to stand comments  21.69 sec w/o UE assist   ? 10 Meter Walk 16.75 sec with SPC   ?  ? Berg Balance Test  ? Sit to Stand Able to stand without using hands and stabilize independently   ? Standing Unsupported Able to stand safely 2 minutes   ? Sitting with Back Unsupported but Feet Supported on Floor or Stool Able to sit safely and securely 2 minutes   ? Stand to Sit Sits safely with minimal use of hands   ? Transfers Able to transfer safely, minor use of hands   ? Standing Unsupported with Eyes Closed Able to stand 10 seconds safely   ? Standing Unsupported with Feet Together Able to place feet together independently and stand for 1 minute with supervision   ? From Standing, Reach Forward with Outstretched Arm Can reach forward >12 cm safely (5")   ? From Standing Position, Pick up Object from [place] to pick up shoe safely and easily   ? From Standing Position, Turn to Look Behind Over each Shoulder Turn sideways only but maintains balance   ? Turn 360 Degrees Able to turn 360 degrees safely but slowly   ? Standing Unsupported, Alternately Place Feet on Step/Stool Needs assistance to keep from falling or unable to try   ? Standing Unsupported, One Foot in Front Able to take small step independently and hold 30 seconds   ? Standing on One Leg Tries to lift leg/unable to hold 3 seconds but remains standing independently   ? Total Score 41   ? Berg comment: 37-45 = Significant (>80%) fall risk   ?  ? Dynamic Gait Index  ? Level Surface Mild Impairment   ? Change in Gait Speed Moderate Impairment   ? Gait with Horizontal Head Turns Moderate Impairment   ? Gait with Vertical Head Turns Moderate  Impairment   ? Gait and Pivot Turn Moderate Impairment   ? Step Over Obstacle Moderate Impairment   ? Step Around Obstacles Mild Impairment   ? Steps Moderate Impairment   ?  Total Score 10   ? DGI comment: Scores of 19 or less are predictive of falls in older community living adults   ?  ? Timed Up and Go Test  ? Normal TUG (seconds) 27.21   with SPC; 29.41 sec w/o AD  ? ?  ?  ? ?  ? ? ? ? ? ? ? ? ? ? ? ? ? ? ? ? [facility] Adult PT Treatment/Exercise - 01/06/22 1356   ? ?  ? Knee/Hip Exercises: Aerobic  ? Nustep L6 x 6 min (UE/LE)   ?  ? Knee/Hip Exercises: Standing  ? Hip Flexion Both;2 sets;10 reps;Stengthening;Knee bent   ? Hip Flexion Limitations march with 2# at ankles   UE support on back of chair  ? Hip Abduction Both;2 sets;10 reps;Stengthening;Knee straight   ? Abduction Limitations 2# - UE support on back of chair   ? Hip Extension Both;2 sets;10 reps;Stengthening;Knee straight   ? Extension Limitations 2# - UE support on back of chair   ? Functional Squat 15 reps;3 seconds   ? Functional Squat Limitations UE support on back of chair with mat table behind pt for safety   ? Other Standing Knee Exercises alt toe clears to 9" step x 10 with single UE support on back of chair; SBA of PT   ? ?  ?  ? ?  ? ? ? ? ? ? ? ? ? ? ? ? PT Short Term Goals - 12/23/21 1127   ? ?  ? PT SHORT TERM GOAL #1  ? Title Patient will be independent with initial HEP including [employer] Program at a supported level   ? Status Achieved   12/23/21 - independent with highlighted exercises/activities but admits to limited compliance due to poor motivation  ? Target Date 12/17/21   ? ?  ?  ? ?  ? ? ? ? PT Long Term Goals - 01/06/22 1406   ? ?  ? PT LONG TERM GOAL #1  ? Title Patient will be independent with ongoing/advanced HEP +/- [place] Fall Prevention Program for self-management at home   ? Status On-going   01/06/22 - Pt denies need for review of [place] program but admits to not completing the exercises 3x/wk as recommended  ?  Target Date 02/17/22   ?  ? PT LONG TERM GOAL #2  ? Title Patient will demonstrate improved B proximal LE strength to >/= 4+/5 for improved stability and ease of mobility   ? Status Partially Met   ? Target Date 02/17/22

## 2022-01-13 ENCOUNTER — Encounter: Payer: Self-pay | Admitting: Physical Therapy

## 2022-01-13 ENCOUNTER — Ambulatory Visit: Payer: Medicare Other | Attending: Neurology | Admitting: Physical Therapy

## 2022-01-13 DIAGNOSIS — M25511 Pain in right shoulder: Secondary | ICD-10-CM | POA: Diagnosis present

## 2022-01-13 DIAGNOSIS — R2681 Unsteadiness on feet: Secondary | ICD-10-CM | POA: Diagnosis present

## 2022-01-13 DIAGNOSIS — R2689 Other abnormalities of gait and mobility: Secondary | ICD-10-CM | POA: Diagnosis present

## 2022-01-13 DIAGNOSIS — M25611 Stiffness of right shoulder, not elsewhere classified: Secondary | ICD-10-CM | POA: Insufficient documentation

## 2022-01-13 DIAGNOSIS — M6281 Muscle weakness (generalized): Secondary | ICD-10-CM | POA: Diagnosis present

## 2022-01-13 DIAGNOSIS — Z9181 History of falling: Secondary | ICD-10-CM | POA: Insufficient documentation

## 2022-01-13 NOTE — Therapy (Signed)
[place] ?[facility] ?[address] ?[place], [place], [address] ?Phone: [phone]   Fax:  [phone] ? ?Physical Therapy Treatment ? ?Patient Details  ?Name: [name] Soto ?MRN: [id number] ?Date of Birth: 19-Jul-1946 ?Referring Provider (PT): Karl Luke, MD ? ? ?Encounter Date: 01/13/2022 ? ? PT End of Session - 01/13/22 0930   ? ? Visit Number 12   ? Number of Visits 23   ? Date for PT Re-Evaluation 02/17/22   ? Authorization Type Medicare & BCBS   ? Progress Note Due on Visit 21   Recert on visit #11 - 6/57/90  ? PT Start Time 0930   ? PT Stop Time 1015   ? PT Time Calculation (min) 45 min   ? Activity Tolerance Patient tolerated treatment well;Patient limited by fatigue   ? Behavior During Therapy [facility] for tasks assessed/performed   ? ?  ?  ? ?  ? ? ?Past Medical History:  ?Diagnosis Date  ? At risk for falls   ? Balance problem   ? Cerebral thrombosis with cerebral infarction [facility])   ? Diabetes mellitus without complication ([place])   ? GERD (gastroesophageal reflux disease)   ? History of kidney stones   ? Humerus fracture   ? PROXIMAL RIGHT  ? Hypertension   ? Nausea and vomiting   ? Pancreatic lesion   ? PONV (postoperative nausea and vomiting)   ? Stroke [facility]) 2012  ? ? ?Past Surgical History:  ?Procedure Laterality Date  ? CATARACT EXTRACTION Bilateral   ? CYSTOSCOPY WITH RETROGRADE PYELOGRAM, URETEROSCOPY AND STENT PLACEMENT Right 01/31/2021  ? Procedure: CYSTOSCOPY WITH RIGHT  RETROGRADE URETEROSCOPY HOLMIUM LASER AND STENT PLACEMENT;  Surgeon: Alexis Frock, MD;  [facility];  Service: Urology;  Laterality: Right;  ? EUS N/A 07/17/2015  ? Procedure: ESOPHAGEAL ENDOSCOPIC ULTRASOUND (EUS) RADIAL;  Surgeon: Arta Silence, MD;  [facility];  Service: Endoscopy;  Laterality: N/A;  ? FINE NEEDLE ASPIRATION N/A 07/17/2015  ? Procedure: FINE NEEDLE ASPIRATION (FNA) RADIAL;  Surgeon: Arta Silence, MD;  [facility];  Service:  Endoscopy;  Laterality: N/A;  ? HEMATOMA DRAINED    ? HERNIA REPAIR    ? inguinal right  ? HYDROCELE EXCISION    ? ORIF HUMERUS FRACTURE Right 09/26/2020  ? Procedure: Open reduction internal fixation right humeral periprosthetic fracture;  Surgeon: Justice Britain, MD;  [facility];  Service: Orthopedics;  Laterality: Right;  154mn  ? REVERSE SHOULDER ARTHROPLASTY Right 05/07/2016  ? Procedure: RIGHT REVERSE TOTAL SHOULDER ARTHROPLASTY;  Surgeon: KJustice Britain MD;  Location: [facility]  Service: Orthopedics;  Laterality: Right;  ? ? ?There were no vitals filed for this visit. ? ? Subjective Assessment - 01/13/22 0933   ? ? Subjective Pt denies any recent falls and feels that things are going well.   ? 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 Patient Stated Goals "to be able to walk w/o a cane"   ? Currently in Pain? No/denies   ? ?  ?  ? ?  ? ? ? ? ? ? ? ? ? ? ? ? ? ? ? ? ? ? ? ? [facility] - 01/13/22 0930   ? ?  ? Ambulation/Gait  ? Ambulation/Gait Assistance 5: Supervision;4: Min guard   close SBA  ?  Ambulation/Gait Assistance Details cues for posture, increased step length, increased hip and knee flexion and heel strike   ? Ambulation Distance (Feet) 100 Feet   ? Assistive device Straight cane   ?  ? Knee/Hip Exercises: Aerobic  ? Nustep L7 x 6 min (UE/LE)   ? ?  ?  ? ?  ? ? ? ? ? ? Balance Exercises - 01/13/22 0930   ? ?  ? Balance Exercises: Standing  ? SLS with Vectors Solid surface;Upper extremity assist 1;5 reps   ? SLS with Vectors Limitations R/L SLS + 4-way tap to colored dots x 1 set, 4-way tap to balance pebbles x 1 set   ? Marching 20 reps;Foam/compliant surface;Static;Upper extremity assist 2   ? Other Standing Exercises Alt toe clears to 9" stool x 20 from firm surface, x 10 from Airex pad - 2 pole A for balance   initially attempted with  [facility] for support/balance but unable to lift L LE  ? Other Standing Exercises Comments Heel/toe and lateral weight shifts on Airex pad x 10 each   ? ?  ?  ? ?  ? ? ? ? ? ? ? PT Short Term Goals - 12/23/21 1127   ? ?  ? PT SHORT TERM GOAL #1  ? Title Patient will be independent with initial HEP including [employer] Program at a supported level   ? Status Achieved   12/23/21 - independent with highlighted exercises/activities but admits to limited compliance due to poor motivation  ? Target Date 12/17/21   ? ?  ?  ? ?  ? ? ? ? PT Long Term Goals - 01/06/22 1406   ? ?  ? PT LONG TERM GOAL #1  ? Title Patient will be independent with ongoing/advanced HEP +/- [place] Fall Prevention Program for self-management at home   ? Status On-going   01/06/22 - Pt denies need for review of [place] program but admits to not completing the exercises 3x/wk as recommended  ? Target Date 02/17/22   ?  ? PT LONG TERM GOAL #2  ? Title Patient will demonstrate improved B proximal LE strength to >/= 4+/5 for improved stability and ease of mobility   ? Status Partially Met   ? Target Date 02/17/22   ?  ? PT LONG TERM GOAL #3  ? Title Patient will improve Berg score to >/= 45/56 to improve safety and stability with ADLs in standing and reduce risk for falls   ? Baseline 34/56 (11/26/21); 41/56 (01/01/22)   ? Status On-going   ? Target Date 02/17/22   ?  ? PT LONG TERM GOAL #4  ? Title Patient will demonstrate decreased TUG time to </= 18 sec to decrease risk for falls with transitional mobility   ? Baseline 25.62 sec with SPC, 27.44 sec w/o AD (11/26/21);  27.21 sec with SPC; 29.41 sec w/o AD (01/01/22)   ? Status On-going   ? Target Date 02/17/22   ?  ? PT LONG TERM GOAL #5  ? Title Patient will ambulate with improved gait pattern and good gait stability with LRAD   ? Status On-going   01/01/22- pt shows somewhat improved gait pattern with improved gait speed to 1.96 ft/sec, but inconsistent with several bouts of instability and/or minor LOB  still evident  ? Target Date 02/17/22   ?  ? PT LONG TERM GOAL #6  ? Title Patient will improve DGI score to >/= 16/24 to improve gait  stability and reduce risk for falls   ? Baseline 7/24 (12/02/21); 10/24 (01/01/22)   ? Status On-going   ? Target Date 02/17/22   ? ?  ?  ? ?  ? ? ? ? ? ? ? ? Plan - 01/13/22 0934   ? ? Clinical Impression Statement Kage denies any recent falls but continues to demonstrate poor foot clearance and small steps with significant gait instability. Therapeutic activities targeting larger steps in multiple directions as well as increased hip and knee flexion to promote better foot clearance. Definite need for UE support (SPC or 2 pole assist) for balance required with all activities. Promoted carryover of increased step length and hip and knee flexion into gait while continuing to cue for heel strike.   ? 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 Rehab Potential Good   ? PT Frequency 2x / week   ? PT Duration 6 weeks   ? PT Treatment/Interventions ADLs/Self Care Home Management;DME Instruction;Gait training;Stair training;Functional mobility training;Therapeutic activities;Therapeutic exercise;Balance training;Neuromuscular re-education;Patient/family education;Manual techniques   ? PT Next Visit Plan Progress core/LE strengthening; Gait training to normalized gait pattern; Static balance; Dynamic stepping/balance Review/update [place] fall prevention program PRN   ? PT Home Exercise Plan [facility] Prevention Program   ? Consulted and Agree with Plan of Care Patient   ? ?  ?  ? ?  ? ? ?Patient will benefit from skilled therapeutic intervention in order to improve the following deficits and impairments:  Abnormal gait, Decreased activity tolerance, Decreased balance, Decreased endurance, Decreased knowledge of precautions, Decreased knowledge of use of DME,  Decreased mobility, Decreased safety awareness, Decreased strength, Impaired perceived functional ability, Improper body mechanics, Postural dysfunction, Pain ? ?Visit Diagnosis: ?Unsteadiness on feet ? ?Other a

## 2022-01-15 ENCOUNTER — Ambulatory Visit: Payer: Medicare Other

## 2022-01-15 DIAGNOSIS — R2681 Unsteadiness on feet: Secondary | ICD-10-CM

## 2022-01-15 DIAGNOSIS — R2689 Other abnormalities of gait and mobility: Secondary | ICD-10-CM

## 2022-01-15 DIAGNOSIS — Z9181 History of falling: Secondary | ICD-10-CM

## 2022-01-15 DIAGNOSIS — M6281 Muscle weakness (generalized): Secondary | ICD-10-CM

## 2022-01-15 NOTE — Therapy (Signed)
[place] [facility] ?[address] ?[place], [place], [address] ?Phone: [phone]   Fax:  [phone] ? ?Physical Therapy Treatment ? ?Patient Details  ?Name: [name] Soto ?MRN: [id number] ?Date of Birth: August 15, 1946 ?Referring Provider (PT): Karl Luke, MD ? ? ?Encounter Date: 01/15/2022 ? ? PT End of Session - 01/15/22 1114   ? ? Visit Number 13   ? Number of Visits 23   ? Date for PT Re-Evaluation 02/17/22   ? Authorization Type Medicare & BCBS   ? Progress Note Due on Visit 21   Recert on visit #50 - 5/69/79  ? PT Start Time 1017   ? PT Stop Time 1102   ? PT Time Calculation (min) 45 min   ? Activity Tolerance Patient tolerated treatment well;Patient limited by fatigue   ? Behavior During Therapy [facility] for tasks assessed/performed   ? ?  ?  ? ?  ? ? ?Past Medical History:  ?Diagnosis Date  ? At risk for falls   ? Balance problem   ? Cerebral thrombosis with cerebral infarction [facility])   ? Diabetes mellitus without complication ([place])   ? GERD (gastroesophageal reflux disease)   ? History of kidney stones   ? Humerus fracture   ? PROXIMAL RIGHT  ? Hypertension   ? Nausea and vomiting   ? Pancreatic lesion   ? PONV (postoperative nausea and vomiting)   ? Stroke [facility]) 2012  ? ? ?Past Surgical History:  ?Procedure Laterality Date  ? CATARACT EXTRACTION Bilateral   ? CYSTOSCOPY WITH RETROGRADE PYELOGRAM, URETEROSCOPY AND STENT PLACEMENT Right 01/31/2021  ? Procedure: CYSTOSCOPY WITH RIGHT  RETROGRADE URETEROSCOPY HOLMIUM LASER AND STENT PLACEMENT;  Surgeon: Alexis Frock, MD;  [facility];  Service: Urology;  Laterality: Right;  ? EUS N/A 07/17/2015  ? Procedure: ESOPHAGEAL ENDOSCOPIC ULTRASOUND (EUS) RADIAL;  Surgeon: Arta Silence, MD;  [facility];  Service: Endoscopy;  Laterality: N/A;  ? FINE NEEDLE ASPIRATION N/A 07/17/2015  ? Procedure: FINE NEEDLE ASPIRATION (FNA) RADIAL;  Surgeon: Arta Silence, MD;  [facility];  Service:  Endoscopy;  Laterality: N/A;  ? HEMATOMA DRAINED    ? HERNIA REPAIR    ? inguinal right  ? HYDROCELE EXCISION    ? ORIF HUMERUS FRACTURE Right 09/26/2020  ? Procedure: Open reduction internal fixation right humeral periprosthetic fracture;  Surgeon: Justice Britain, MD;  [facility];  Service: Orthopedics;  Laterality: Right;  119mn  ? REVERSE SHOULDER ARTHROPLASTY Right 05/07/2016  ? Procedure: RIGHT REVERSE TOTAL SHOULDER ARTHROPLASTY;  Surgeon: KJustice Britain MD;  [facility]  Service: Orthopedics;  Laterality: Right;  ? ? ?There were no vitals filed for this visit. ? ? Subjective Assessment - 01/15/22 1022   ? ? Subjective Pt reports everything going well.   ? 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 Diagnostic tests per wife- recent xrays on 11/06/20 showed good healing   ? Patient Stated Goals "to be able to walk w/o a cane"   ? Currently in Pain? No/denies   ? ?  ?  ? ?  ? ? ? ? ? ? ? ? ? ? ? ? ? ? ? ? ? ? ? ? [place]ult PT Treatment/Exercise - 01/15/22 0001   ? ?  ? Lumbar Exercises: Aerobic  ? Recumbent  Bike L2x65mn   ?  ? Lumbar Exercises: Standing  ? Other Standing Lumbar Exercises pallof press standing on airex pad with yellow weighted ball x 15   ? Other Standing Lumbar Exercises R/L D2 flexion with yellow wt ball standing on airex x 12   ?  ? Knee/Hip Exercises: Seated  ? Long [employer] CorporationStrengthening;Both;10 reps;Weights   ? Long Arc Quad Weight 3 lbs.   ? Other Seated Knee/Hip Exercises monster steps with GTB 10x   ? Marching Strengthening;Both;10 reps;Weights   ? Marching Weights 3 lbs.   ? ?  ?  ? ?  ? ? ? ? ? ? Balance Exercises - 01/15/22 0001   ? ?  ? Balance Exercises: Standing  ? SLS with Vectors Solid surface;Upper extremity assist 1;5 reps   ? SLS with Vectors Limitations R/L SLS + 4-way tap to colored dots 2x5 each way   ? Other Standing Exercises  braiding over and around obstacles 3x along treadmill both ways   ? ?  ?  ? ?  ? ? ? ? ? ? ? PT Short Term Goals - 12/23/21 1127   ? ?  ? PT SHORT TERM GOAL #1  ? Title Patient will be independent with initial HEP including [employer] at a supported level   ? Status Achieved   12/23/21 - independent with highlighted exercises/activities but admits to limited compliance due to poor motivation  ? Target Date 12/17/21   ? ?  ?  ? ?  ? ? ? ? PT Long Term Goals - 01/06/22 1406   ? ?  ? PT LONG TERM GOAL #1  ? Title Patient will be independent with ongoing/advanced HEP +/- [place] Fall Prevention Program for self-management at home   ? Status On-going   01/06/22 - Pt denies need for review of [employer] but admits to not completing the exercises 3x/wk as recommended  ? Target Date 02/17/22   ?  ? PT LONG TERM GOAL #2  ? Title Patient will demonstrate improved B proximal LE strength to >/= 4+/5 for improved stability and ease of mobility   ? Status Partially Met   ? Target Date 02/17/22   ?  ? PT LONG TERM GOAL #3  ? Title Patient will improve Berg score to >/= 45/56 to improve safety and stability with ADLs in standing and reduce risk for falls   ? Baseline 34/56 (11/26/21); 41/56 (01/01/22)   ? Status On-going   ? Target Date 02/17/22   ?  ? PT LONG TERM GOAL #4  ? Title Patient will demonstrate decreased TUG time to </= 18 sec to decrease risk for falls with transitional mobility   ? Baseline 25.62 sec with SPC, 27.44 sec w/o AD (11/26/21);  27.21 sec with SPC; 29.41 sec w/o AD (01/01/22)   ? Status On-going   ? Target Date 02/17/22   ?  ? PT LONG TERM GOAL #5  ? Title Patient will ambulate with improved gait pattern and good gait stability with LRAD   ? Status On-going   01/01/22- pt shows somewhat improved gait pattern with improved gait speed to 1.96 ft/sec, but inconsistent with several bouts of instability and/or minor LOB still evident  ? Target Date 02/17/22   ?  ? PT LONG TERM GOAL #6  ? Title  Patient will improve DGI score to >/= 16/24 to improve gait stability and reduce risk for falls   ? Baseline 7/24 (12/02/21); 10/24 (  01/01/22)   ? Status On-going   ? Target Date 02/17/22   ? ?  ?  ? ?  ? ? ? ? ? ? ? ? Plan - 01/15/22 1115   ? ? Clinical Impression Statement Progressed neuromuscular re-ed to work on more balance standing on compliant surface and to increase core activation. Pt had a few instances of LOB but able to self recover. Progressed balance from airex w/o UE support, however multiple cues to maintain an upright posture required. Continued to work on LE strengthening to improve stability and restore normal mechanics of gait. Pt responded well and shows no limitation to keep progressing exercises.   ? Personal Factors and Comorbidities Age;Behavior Pattern;Comorbidity 3+;Past/Current Experience;Time since onset of injury/illness/exacerbation;Transportation   ? 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 PT Frequency 2x / week   ? PT Duration 6 weeks   ? PT Treatment/Interventions ADLs/Self Care Home Management;DME Instruction;Gait training;Stair training;Functional mobility training;Therapeutic activities;Therapeutic exercise;Balance training;Neuromuscular re-education;Patient/family education;Manual techniques   ? PT Next Visit Plan Progress core/LE strengthening; Gait training to normalized gait pattern; Static balance; Dynamic stepping/balance Review/update [place] fall prevention program PRN   ? PT Home Exercise Plan [facility] Prevention Program   ? Consulted and Agree with Plan of Care Patient   ? ?  ?  ? ?  ? ? ?Patient will benefit from skilled therapeutic intervention in order to improve the following deficits and impairments:  Abnormal gait, Decreased activity tolerance, Decreased balance, Decreased endurance, Decreased knowledge of precautions, Decreased  knowledge of use of DME, Decreased mobility, Decreased safety awareness, Decreased strength, Impaired perceived functional ability, Improper body mechanics, Postural dysfunction, Pain ? ?Visit Diagnosis: ?Steward Ros

## 2022-01-20 ENCOUNTER — Ambulatory Visit: Payer: Medicare Other | Admitting: Physical Therapy

## 2022-01-20 ENCOUNTER — Encounter: Payer: Self-pay | Admitting: Physical Therapy

## 2022-01-20 DIAGNOSIS — R2681 Unsteadiness on feet: Secondary | ICD-10-CM | POA: Diagnosis not present

## 2022-01-20 DIAGNOSIS — R2689 Other abnormalities of gait and mobility: Secondary | ICD-10-CM

## 2022-01-20 DIAGNOSIS — M6281 Muscle weakness (generalized): Secondary | ICD-10-CM

## 2022-01-20 DIAGNOSIS — M25511 Pain in right shoulder: Secondary | ICD-10-CM

## 2022-01-20 DIAGNOSIS — M25611 Stiffness of right shoulder, not elsewhere classified: Secondary | ICD-10-CM

## 2022-01-20 DIAGNOSIS — Z9181 History of falling: Secondary | ICD-10-CM

## 2022-01-20 NOTE — Therapy (Signed)
[place] [facility] ?[address] [place] ?[place], [place], [address] ?Phone: [phone]   Fax:  [phone] ? ?Physical Therapy Treatment ? ?Patient Details  ?Name: [name] Soto ?MRN: [id number] ?Date of Birth: Jan 13, 1946 ?Referring Provider (PT): Karl Luke, MD ? ? ?Encounter Date: 01/20/2022 ? ? PT End of Session - 01/20/22 1455   ? ? Visit Number 14   ? Number of Visits 23   ? Date for PT Re-Evaluation 02/17/22   ? Authorization Type Medicare & BCBS   ? Progress Note Due on Visit 21   Recert on visit #79 - 8/92/11  ? PT Start Time 9417   Pt checked in online but did not alter the front desk to his arrival  ? PT Stop Time 1536   ? PT Time Calculation (min) 41 min   ? Activity Tolerance Patient tolerated treatment well   ? Behavior During Therapy [facility] for tasks assessed/performed   ? ?  ?  ? ?  ? ? ?Past Medical History:  ?Diagnosis Date  ? At risk for falls   ? Balance problem   ? Cerebral thrombosis with cerebral infarction [facility])   ? Diabetes mellitus without complication ([place])   ? GERD (gastroesophageal reflux disease)   ? History of kidney stones   ? Humerus fracture   ? PROXIMAL RIGHT  ? Hypertension   ? Nausea and vomiting   ? Pancreatic lesion   ? PONV (postoperative nausea and vomiting)   ? Stroke [facility]) 2012  ? ? ?Past Surgical History:  ?Procedure Laterality Date  ? CATARACT EXTRACTION Bilateral   ? CYSTOSCOPY WITH RETROGRADE PYELOGRAM, URETEROSCOPY AND STENT PLACEMENT Right 01/31/2021  ? Procedure: CYSTOSCOPY WITH RIGHT  RETROGRADE URETEROSCOPY HOLMIUM LASER AND STENT PLACEMENT;  Surgeon: Alexis Frock, MD;  [facility];  Service: Urology;  Laterality: Right;  ? EUS N/A 07/17/2015  ? Procedure: ESOPHAGEAL ENDOSCOPIC ULTRASOUND (EUS) RADIAL;  Surgeon: Arta Silence, MD;  [facility];  Service: Endoscopy;  Laterality: N/A;  ? FINE NEEDLE ASPIRATION N/A 07/17/2015  ? Procedure: FINE NEEDLE ASPIRATION (FNA) RADIAL;  Surgeon: Arta Silence,  MD;  [facility];  Service: Endoscopy;  Laterality: N/A;  ? HEMATOMA DRAINED    ? HERNIA REPAIR    ? inguinal right  ? HYDROCELE EXCISION    ? ORIF HUMERUS FRACTURE Right 09/26/2020  ? Procedure: Open reduction internal fixation right humeral periprosthetic fracture;  Surgeon: Justice Britain, MD;  [facility];  Service: Orthopedics;  Laterality: Right;  171mn  ? REVERSE SHOULDER ARTHROPLASTY Right 05/07/2016  ? Procedure: RIGHT REVERSE TOTAL SHOULDER ARTHROPLASTY;  Surgeon: KJustice Britain MD;  [place]  Service: Orthopedics;  Laterality: Right;  ? ? ?There were no vitals filed for this visit. ? ? Subjective Assessment - 01/20/22 1458   ? ? Subjective Pt reports no new concerns today.   ? 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 Patient Stated Goals "to be able to walk w/o a cane"   ? Currently in Pain? No/denies   ? ?  ?  ? ?  ? ? ? ? ? ? ? ? ? ? ? ? ? ? ? ? ? ? ? ? OSan AntonioAdult PT Treatment/Exercise - 01/20/22 1455   ? ?  ? Ambulation/Gait  ? Ambulation/Gait Assistance 5: Supervision;4:  Min guard   close SBA/CGA  ? Ambulation/Gait Assistance Details cues for posture, increased step length, increased hip and knee flexion and heel strike   ? Ambulation Distance (Feet) 100 Feet   ? Assistive device Straight cane   ?  ? Knee/Hip Exercises: Aerobic  ? Recumbent Bike L3 x 6 min   ? ?  ?  ? ?  ? ? ? ? ? ? Balance Exercises - 01/20/22 1455   ? ?  ? Balance Exercises: Standing  ? Tandem Gait Forward;Retro;4 reps;Upper extremity support   ? Tandem Gait Limitations single UE support on counter, cues for upright posture and fwd gaze   ? Step Over Hurdles / Cones Sequential fwd and lateral step-over 1/2 FR and 2 yoga blocks; UE support on counter   ? Other Standing Exercises Comments Fwd/lateral step & reach for cones on counter - initial 10 reps with ipsilateral reach,  latter attempts with crossbody contralateral reach (pt having more difficulty remembering pattern)   ? ?  ?  ? ?  ? ? ? ? ? ? ? PT Short Term Goals - 12/23/21 1127   ? ?  ? PT SHORT TERM GOAL #1  ? Title Patient will be independent with initial HEP including [employer] Program at a supported level   ? Status Achieved   12/23/21 - independent with highlighted exercises/activities but admits to limited compliance due to poor motivation  ? Target Date 12/17/21   ? ?  ?  ? ?  ? ? ? ? PT Long Term Goals - 01/06/22 1406   ? ?  ? PT LONG TERM GOAL #1  ? Title Patient will be independent with ongoing/advanced HEP +/- [place] Prevention Program for self-management at home   ? Status On-going   01/06/22 - Pt denies need for review of [place] program but admits to not completing the exercises 3x/wk as recommended  ? Target Date 02/17/22   ?  ? PT LONG TERM GOAL #2  ? Title Patient will demonstrate improved B proximal LE strength to >/= 4+/5 for improved stability and ease of mobility   ? Status Partially Met   ? Target Date 02/17/22   ?  ? PT LONG TERM GOAL #3  ? Title Patient will improve Berg score to >/= 45/56 to improve safety and stability with ADLs in standing and reduce risk for falls   ? Baseline 34/56 (11/26/21); 41/56 (01/01/22)   ? Status On-going   ? Target Date 02/17/22   ?  ? PT LONG TERM GOAL #4  ? Title Patient will demonstrate decreased TUG time to </= 18 sec to decrease risk for falls with transitional mobility   ? Baseline 25.62 sec with SPC, 27.44 sec w/o AD (11/26/21);  27.21 sec with SPC; 29.41 sec w/o AD (01/01/22)   ? Status On-going   ? Target Date 02/17/22   ?  ? PT LONG TERM GOAL #5  ? Title Patient will ambulate with improved gait pattern and good gait stability with LRAD   ? Status On-going   01/01/22- pt shows somewhat improved gait pattern with improved gait speed to 1.96 ft/sec, but inconsistent with several bouts of instability and/or minor LOB still evident  ? Target Date 02/17/22   ?  ?  PT LONG TERM GOAL #6  ? Title Patient will improve DGI score to >/= 16/24 to improve gait stability and reduce risk for falls   ? Baseline 7/24 (12/02/21); 10/24 (01/01/22)   ?  Status On-going   ? Target Date 02/17/22   ? ?  ?  ? ?  ? ? ? ? ? ? ? ? Plan - 01/20/22 1536   ? ? Clinical Impression Statement Domanick reports he started PT ?worn-out? 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 Focused on stepping strategies encouraging increased foot clearance and larger steps while challenging lateral stability with dynamic reaching activities - pt having some difficulty with coordination of movements especially with cross-body reaching and remains inconsistent with foot clearance over obstacles necessitating CGA/SBA for all activities. Promoted carryover of increased step length and foot clearance into gait but multiple minor LOB occurring with effort.   ? 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 PT Frequency 2x / week   ? PT Duration 6 weeks   ? PT Treatment/Interventions ADLs/Self Care Home Management;DME Instruction;Gait training;Stair training;Functional mobility training;Therapeutic activities;Therapeutic exercise;Balance training;Neuromuscular re-education;Patient/family education;Manual techniques   ? PT Next Visit Plan Progress core/LE strengthening; Gait training to normalized gait pattern; Static balance; Dynamic stepping/balance Review/update [place] fall prevention program PRN   ? PT Home Exercise Plan [facility] Prevention Program   ? Consulted and Agree with Plan of Care Patient   ? ?  ?  ? ?  ? ? ?Patient will benefit from skilled therapeutic intervention in order to improve the following deficits and impairments:  Abnormal gait, Decreased activity tolerance, Decreased balance, Decreased  endurance, Decreased knowledge of precautions, Decreased knowledge of use of DME, Decreased mobility, Decreased safety awareness, Decreased strength, Impaired perceived functional ability, Improper body mech

## 2022-01-22 ENCOUNTER — Ambulatory Visit: Payer: Medicare Other | Admitting: Physical Therapy

## 2022-01-27 ENCOUNTER — Other Ambulatory Visit: Payer: Self-pay

## 2022-01-27 ENCOUNTER — Encounter: Payer: Self-pay | Admitting: Physical Therapy

## 2022-01-27 ENCOUNTER — Ambulatory Visit: Payer: Medicare Other | Admitting: Physical Therapy

## 2022-01-27 DIAGNOSIS — M6281 Muscle weakness (generalized): Secondary | ICD-10-CM

## 2022-01-27 DIAGNOSIS — R2689 Other abnormalities of gait and mobility: Secondary | ICD-10-CM

## 2022-01-27 DIAGNOSIS — M25511 Pain in right shoulder: Secondary | ICD-10-CM

## 2022-01-27 DIAGNOSIS — R2681 Unsteadiness on feet: Secondary | ICD-10-CM

## 2022-01-27 DIAGNOSIS — Z9181 History of falling: Secondary | ICD-10-CM

## 2022-01-27 DIAGNOSIS — M25611 Stiffness of right shoulder, not elsewhere classified: Secondary | ICD-10-CM

## 2022-01-27 NOTE — Therapy (Signed)
[facility] ?[address] ?[place], [place], [address] ?Phone: [phone]   Fax:  [phone] ? ?Physical Therapy Treatment ? ?Patient Details  ?Name: [name] Soto ?MRN: [id number] ?Date of Birth: [birth year] ?Referring Provider (PT): Karl Luke, MD ? ? ?Encounter Date: 01/27/2022 ? ? PT End of Session - 01/27/22 1103   ? ? Visit Number 15   ? Number of Visits 23   ? Date for PT Re-Evaluation 02/17/22   ? Authorization Type Medicare & BCBS   ? Progress Note Due on Visit 21   ? PT Start Time 1100   ? PT Stop Time 1145   ? PT Time Calculation (min) 45 min   ? Activity Tolerance Patient tolerated treatment well;Patient limited by fatigue   ? Behavior During Therapy [facility] for tasks assessed/performed   ? ?  ?  ? ?  ? ? ?Past Medical History:  ?Diagnosis Date  ? At risk for falls   ? Balance problem   ? Cerebral thrombosis with cerebral infarction [facility])   ? Diabetes mellitus without complication ([place])   ? GERD (gastroesophageal reflux disease)   ? History of kidney stones   ? Humerus fracture   ? PROXIMAL RIGHT  ? Hypertension   ? Nausea and vomiting   ? Pancreatic lesion   ? PONV (postoperative nausea and vomiting)   ? Stroke [facility]) 2012  ? ? ?Past Surgical History:  ?Procedure Laterality Date  ? CATARACT EXTRACTION Bilateral   ? CYSTOSCOPY WITH RETROGRADE PYELOGRAM, URETEROSCOPY AND STENT PLACEMENT Right 01/31/2021  ? Procedure: CYSTOSCOPY WITH RIGHT  RETROGRADE URETEROSCOPY HOLMIUM LASER AND STENT PLACEMENT;  Surgeon: Alexis Frock, MD;  [facility]: [facility];  Service: Urology;  Laterality: Right;  ? EUS N/A 07/17/2015  ? Procedure: ESOPHAGEAL ENDOSCOPIC ULTRASOUND (EUS) RADIAL;  Surgeon: Arta Silence, MD;  [facility];  Service: Endoscopy;  Laterality: N/A;  ? FINE NEEDLE ASPIRATION N/A 07/17/2015  ? Procedure: FINE NEEDLE ASPIRATION (FNA) RADIAL;  Surgeon: Arta Silence, MD;  [facility];  Service: Endoscopy;  Laterality: N/A;  ?  HEMATOMA DRAINED    ? HERNIA REPAIR    ? inguinal right  ? HYDROCELE EXCISION    ? ORIF HUMERUS FRACTURE Right 09/26/2020  ? Procedure: Open reduction internal fixation right humeral periprosthetic fracture;  Surgeon: Justice Britain, MD;  [facility];  Service: Orthopedics;  Laterality: Right;  185mn  ? REVERSE SHOULDER ARTHROPLASTY Right 05/07/2016  ? Procedure: RIGHT REVERSE TOTAL SHOULDER ARTHROPLASTY;  Surgeon: KJustice Britain MD;  [place]  Service: Orthopedics;  Laterality: Right;  ? ? ?There were no vitals filed for this visit. ? ? Subjective Assessment - 01/27/22 1104   ? ? Subjective Patient reports he is still tired. 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 He also stated he fell the other day on the carpet. Tripped going to the restroom.   ? 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 Diagnostic tests per wife- recent xrays on 11/06/20 showed good healing   ? Patient Stated Goals "to be able to walk w/o a cane"   ? Currently in Pain? No/denies   ? ?  ?  ? ?  ? ? ? ? ? ? ? ? ? ? ? ? ? ? ? ? ? ? ? ?  [facility] Adult PT Treatment/Exercise - 01/27/22 0001   ? ?  ? Ambulation/Gait  ? Ambulation/Gait Assistance 6: Modified independent (Device/Increase time)   ? Ambulation Distance (Feet) 40 Feet   ? Assistive device Rolling walker   ? Gait Pattern Within Functional Limits   ? Ambulation Surface Level   ? Gait velocity quick pace but not timed   ?  ? Lumbar Exercises: Aerobic  ? Nustep L5 x 6 min   ?  ? Lumbar Exercises: Standing  ? Other Standing Lumbar Exercises pallof press standing on airex pad with yellow weighted ball x 10; reported fatigue today   ? Other Standing Lumbar Exercises standing trunk rotation with yellow wt ball standing on airex x 8   ?  ? Knee/Hip Exercises: Seated  ? Long [employer] Strengthening;Both;10 reps;Weights   ? Long Arc Quad Weight 3 lbs.    ? Marching Strengthening;Both;10 reps;Weights   ? Marching Weights 3 lbs.   ? Sit to Sand 5 reps   1st rep needed one UE support to prevent falling bwd  ? ?  ?  ? ?  ? ? ? ? ? ? Balance Exercises - 01/27/22 0001   ? ?  ? Balance Exercises: Standing  ? Standing Eyes Opened Foam/compliant surface;Time;Head turns;Limitations   ? Standing Eyes Opened Time x 2 min   ? Standing Eyes Opened Limitations LOB looking left   ? Other Standing Exercises weight shifting to toes/heels with self correct on foam; also worked on stepping stratgy on foam and level ground   ? ?  ?  ? ?  ? ? ? ? ? ? ? PT Short Term Goals - 12/23/21 1127   ? ?  ? PT SHORT TERM GOAL #1  ? Title Patient will be independent with initial HEP including [employer] Program at a supported level   ? Status Achieved   12/23/21 - independent with highlighted exercises/activities but admits to limited compliance due to poor motivation  ? Target Date 12/17/21   ? ?  ?  ? ?  ? ? ? ? PT Long Term Goals - 01/06/22 1406   ? ?  ? PT LONG TERM GOAL #1  ? Title Patient will be independent with ongoing/advanced HEP +/- [place] Prevention Program for self-management at home   ? Status On-going   01/06/22 - Pt denies need for review of [place] program but admits to not completing the exercises 3x/wk as recommended  ? Target Date 02/17/22   ?  ? PT LONG TERM GOAL #2  ? Title Patient will demonstrate improved B proximal LE strength to >/= 4+/5 for improved stability and ease of mobility   ? Status Partially Met   ? Target Date 02/17/22   ?  ? PT LONG TERM GOAL #3  ? Title Patient will improve Berg score to >/= 45/56 to improve safety and stability with ADLs in standing and reduce risk for falls   ? Baseline 34/56 (11/26/21); 41/56 (01/01/22)   ? Status On-going   ? Target Date 02/17/22   ?  ? PT LONG TERM GOAL #4  ? Title Patient will demonstrate decreased TUG time to </= 18 sec to decrease risk for falls with transitional mobility   ? Baseline 25.62 sec with SPC,  27.44 sec w/o AD (11/26/21);  27.21 sec with SPC; 29.41 sec w/o AD (01/01/22)   ? Status On-going   ? Target Date 02/17/22   ?  ? PT LONG TERM  GOAL #5  ? Title Patient will ambulate with improved gait pattern and good gait stability with LRAD   ? Status On-going   01/01/22- pt shows somewhat improved gait pattern with improved gait speed to 1.96 ft/sec, but inconsistent with several bouts of instability and/or minor LOB still evident  ? Target Date 02/17/22   ?  ? PT LONG TERM GOAL #6  ? Title Patient will improve DGI score to >/= 16/24 to improve gait stability and reduce risk for falls   ? Baseline 7/24 (12/02/21); 10/24 (01/01/22)   ? Status On-going   ? Target Date 02/17/22   ? ?  ?  ? ?  ? ? ? ? ? ? ? ? Plan - 01/27/22 1242   ? ? Clinical Impression Statement [place] reported he is still feeling tired after working over [employer] Day weekend. He also reported he fell at home on the carpet when getting upt to go to the restroom from his chair. He denies injury. He tolerated balance activities fairly well today. He did get "winded" with weighted ball activities. PT encouraged use of rollator at home at least for safety and had him practice with it. He repeatedly stated he isn't going to use a walker. He reports non compliance with HEP. Compliance with HEP and walking program was also stressed.   ? Personal Factors and Comorbidities Age;Behavior Pattern;Comorbidity 3+;Past/Current Experience;Time since onset of injury/illness/exacerbation;Transportation   ? 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 Examination-Activity Limitations Bed Mobility;Bend;Squat;Transfers;Stand;Locomotion Level;Stairs   ? Examination-Participation Restrictions Church;Cleaning;Shop;Community Activity;Laundry;Meal Prep   ? PT Frequency 2x / week   ? PT Duration 6 weeks   ? PT Treatment/Interventions ADLs/Self Care  Home Management;DME Instruction;Gait training;Stair training;Functional mobility training;Therapeutic activities;Therapeutic exercise;Balance training;Neuromuscular re-education;Patient/family education;[employer]

## 2022-01-29 ENCOUNTER — Ambulatory Visit: Payer: Medicare Other

## 2022-01-29 DIAGNOSIS — R2689 Other abnormalities of gait and mobility: Secondary | ICD-10-CM

## 2022-01-29 DIAGNOSIS — R2681 Unsteadiness on feet: Secondary | ICD-10-CM | POA: Diagnosis not present

## 2022-01-29 DIAGNOSIS — M6281 Muscle weakness (generalized): Secondary | ICD-10-CM

## 2022-01-29 DIAGNOSIS — Z9181 History of falling: Secondary | ICD-10-CM

## 2022-01-29 NOTE — Therapy (Signed)
[facility] [address]  [place], [place], [address] Phone: [phone]   Fax:  [phone]  Physical Therapy Treatment  Patient Details  Name: [name] Soto MRN: [id number] Date of Birth: 1946/07/06 Referring Provider (PT): Karl Luke, MD   Encounter Date: 01/29/2022   PT End of Session - 01/29/22 1022     Visit Number 16    Number of Visits 23    Date for PT Re-Evaluation 02/17/22    Authorization Type Medicare & BCBS    Progress Note Due on Visit 21    PT Start Time 0933    PT Stop Time 4259    PT Time Calculation (min) 42 min    Activity Tolerance Patient tolerated treatment well    Behavior During Therapy WFL for tasks assessed/performed             Past Medical History:  Diagnosis Date   At risk for falls    Balance problem    Cerebral thrombosis with cerebral infarction ([place])    Diabetes mellitus without complication ([place])    GERD (gastroesophageal reflux disease)    History of kidney stones    Humerus fracture    PROXIMAL RIGHT   Hypertension    Nausea and vomiting    Pancreatic lesion    PONV (postoperative nausea and vomiting)    Stroke ([place]) 2012    Past Surgical History:  Procedure Laterality Date   CATARACT EXTRACTION Bilateral    CYSTOSCOPY WITH RETROGRADE PYELOGRAM, URETEROSCOPY AND STENT PLACEMENT Right 01/31/2021   Procedure: CYSTOSCOPY WITH RIGHT  RETROGRADE URETEROSCOPY HOLMIUM LASER AND STENT PLACEMENT;  Surgeon: Alexis Frock, MD;  [facility];  Service: Urology;  Laterality: Right;   EUS N/A 07/17/2015   Procedure: ESOPHAGEAL ENDOSCOPIC ULTRASOUND (EUS) RADIAL;  Surgeon: Arta Silence, MD;  [facility];  Service: Endoscopy;  Laterality: N/A;   FINE NEEDLE ASPIRATION N/A 07/17/2015   Procedure: FINE NEEDLE ASPIRATION (FNA) RADIAL;  Surgeon: Arta Silence, MD;  [facility];  Service: Endoscopy;  Laterality: N/A;   HEMATOMA DRAINED     HERNIA  REPAIR     inguinal right   HYDROCELE EXCISION     ORIF HUMERUS FRACTURE Right 09/26/2020   Procedure: Open reduction internal fixation right humeral periprosthetic fracture;  Surgeon: Justice Britain, MD;  [facility];  Service: Orthopedics;  Laterality: Right;  174mn   REVERSE SHOULDER ARTHROPLASTY Right 05/07/2016   Procedure: RIGHT REVERSE TOTAL SHOULDER ARTHROPLASTY;  Surgeon: KJustice Britain MD;  [place]  Service: Orthopedics;  Laterality: Right;    There were no vitals filed for this visit.   Subjective Assessment - 01/29/22 0937     Subjective Patient reports he is doing some of HEP at home.    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 No/denies                               [facility]       Lumbar Exercises: Aerobic   Nustep L6 x 6 min  Lumbar Exercises: Standing   Functional Squats 10 reps   2 sets   Functional Squats Limitations UE support      Knee/Hip Exercises: Standing   Hip Abduction Stengthening;Right;10 reps;Knee straight    Abduction Limitations with 1# weight; no UE support, only able to do 5 with L UE    SLS R/L leg tap to squishy toys into abduction w/o UE support x 15 each    Other Standing Knee Exercises side steps along the treadmill length 4x down and back; no UE support    Other Standing Knee Exercises standing marches with no UE support 1# weights 2x10                       PT Short Term Goals - 12/23/21 1127       PT SHORT TERM GOAL #1   Title Patient will be independent with initial HEP including [place] Prevention Program at a supported level    Status Achieved   12/23/21  - independent with highlighted exercises/activities but admits to limited compliance due to poor motivation   Target Date 12/17/21               PT Long Term Goals - 01/06/22 1406       PT LONG TERM GOAL #1   Title Patient will be independent with ongoing/advanced HEP +/- [place] Prevention Program for self-management at home    Status On-going   01/06/22 - Pt denies need for review of [place] program but admits to not completing the exercises 3x/wk as recommended   Target Date 02/17/22      PT LONG TERM GOAL #2   Title Patient will demonstrate improved B proximal LE strength to >/= 4+/5 for improved stability and ease of mobility    Status Partially Met    Target Date 02/17/22      PT LONG TERM GOAL #3   Title Patient will improve Berg score to >/= 45/56 to improve safety and stability with ADLs in standing and reduce risk for falls    Baseline 34/56 (11/26/21); 41/56 (01/01/22)    Status On-going    Target Date 02/17/22      PT LONG TERM GOAL #4   Title Patient will demonstrate decreased TUG time to </= 18 sec to decrease risk for falls with transitional mobility    Baseline 25.62 sec with SPC, 27.44 sec w/o AD (11/26/21);  27.21 sec with SPC; 29.41 sec w/o AD (01/01/22)    Status On-going    Target Date 02/17/22      PT LONG TERM GOAL #5   Title Patient will ambulate with improved gait pattern and good gait stability with LRAD    Status On-going   01/01/22- pt shows somewhat improved gait pattern with improved gait speed to 1.96 ft/sec, but inconsistent with several bouts of instability and/or minor LOB still evident   Target Date 02/17/22      PT LONG TERM GOAL #6   Title Patient will improve DGI score to >/= 16/24 to improve gait stability and reduce risk for falls    Baseline 7/24 (12/02/21); 10/24 (01/01/22)    Status On-going    Target Date 02/17/22                   Plan - 01/29/22 1022     Clinical Impression Statement Pt showed a good response to  treatment. We worked more on balance and strengthening in weight bearing  w/o UE support. He was having trouble with keeping upright posture and eccentric control with standing marches and hip abduction, was unable to complete L hip abduction due to fatigue. Cues with TE for correct movement patterns and form to isolate the correct muscles for max benefit.    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, R reverse TSA in 04/2016 s/p fall, R UE periprosthetic fracture s/p ORIF in 09/2020 d/t fall, DM, HTN, hammer toes, GERD    PT Frequency 2x / week    PT Duration 6 weeks    PT Treatment/Interventions ADLs/Self Care Home Management;DME Instruction;Gait training;Stair training;Functional mobility training;Therapeutic activities;Therapeutic exercise;Balance training;Neuromuscular re-education;Patient/family education;Manual techniques    PT Next Visit Plan Progress core/LE strengthening; Gait training to normalized gait pattern; Static balance; Dynamic stepping/balance Review/update [place] fall prevention program PRN    PT Home Exercise Plan [place] Prevention Program    Consulted and Agree with Plan of Care Patient             Patient will benefit from skilled therapeutic intervention in order to improve the following deficits and impairments:  Abnormal gait, Decreased activity tolerance, Decreased balance, Decreased endurance, Decreased knowledge of precautions, Decreased knowledge of use of DME, Decreased mobility, Decreased safety awareness, Decreased strength, Impaired perceived functional ability, Improper body mechanics, Postural dysfunction, Pain  Visit Diagnosis: Unsteadiness on feet  Other abnormalities of gait and mobility  History of falling  Muscle weakness (generalized)     Problem List Patient Active Problem  List   Diagnosis Date Noted   Acute kidney injury ([place]) 01/31/2021   Right ureteral stone 01/31/2021   Essential hypertension 01/31/2021   GERD without esophagitis 01/31/2021   Type 2 diabetes mellitus with complication, without long-term current use of insulin ([place]) 01/31/2021   Mixed diabetic hyperlipidemia associated with type 2 diabetes mellitus ([place]) 01/31/2021   Acute renal failure (ARF) ([place]) 01/31/2021   S/P ORIF (open reduction internal fixation) fracture 09/26/2020   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    Artist Pais, PTA 01/29/2022, 12:12 PM  [facility] [address]  [place], [place], [address] Phone: [phone]   Fax:  [phone]  Name: [name] Soto MRN: [id number] Date of Birth: 1945-12-21

## 2022-02-03 ENCOUNTER — Ambulatory Visit: Payer: Medicare Other

## 2022-02-03 DIAGNOSIS — M6281 Muscle weakness (generalized): Secondary | ICD-10-CM

## 2022-02-03 DIAGNOSIS — R2681 Unsteadiness on feet: Secondary | ICD-10-CM | POA: Diagnosis not present

## 2022-02-03 DIAGNOSIS — Z9181 History of falling: Secondary | ICD-10-CM

## 2022-02-03 DIAGNOSIS — R2689 Other abnormalities of gait and mobility: Secondary | ICD-10-CM

## 2022-02-03 NOTE — Therapy (Signed)
[facility] [address]  [place], [place], [address] Phone: [phone]   Fax:  [phone]  Physical Therapy Treatment  Patient Details  Name: [name] Soto MRN: [id number] Date of Birth: November 21, 1945 Referring Provider (PT): Karl Luke, MD   Encounter Date: 02/03/2022   PT End of Session - 02/03/22 1104     Visit Number 17    Number of Visits 23    Date for PT Re-Evaluation 02/17/22    Authorization Type Medicare & BCBS    Progress Note Due on Visit 21    PT Start Time 1017    PT Stop Time 1100    PT Time Calculation (min) 43 min    Activity Tolerance Patient tolerated treatment well    Behavior During Therapy WFL for tasks assessed/performed             Past Medical History:  Diagnosis Date   At risk for falls    Balance problem    Cerebral thrombosis with cerebral infarction ([place])    Diabetes mellitus without complication ([place])    GERD (gastroesophageal reflux disease)    History of kidney stones    Humerus fracture    PROXIMAL RIGHT   Hypertension    Nausea and vomiting    Pancreatic lesion    PONV (postoperative nausea and vomiting)    Stroke ([place]) 2012    Past Surgical History:  Procedure Laterality Date   CATARACT EXTRACTION Bilateral    CYSTOSCOPY WITH RETROGRADE PYELOGRAM, URETEROSCOPY AND STENT PLACEMENT Right 01/31/2021   Procedure: CYSTOSCOPY WITH RIGHT  RETROGRADE URETEROSCOPY HOLMIUM LASER AND STENT PLACEMENT;  Surgeon: Alexis Frock, MD;  Location: [facility];  Service: Urology;  Laterality: Right;   EUS N/A 07/17/2015   Procedure: ESOPHAGEAL ENDOSCOPIC ULTRASOUND (EUS) RADIAL;  Surgeon: Arta Silence, MD;  [facility];  Service: Endoscopy;  Laterality: N/A;   FINE NEEDLE ASPIRATION N/A 07/17/2015   Procedure: FINE NEEDLE ASPIRATION (FNA) RADIAL;  Surgeon: Arta Silence, MD;  [facility];  Service: Endoscopy;  Laterality: N/A;   HEMATOMA DRAINED     HERNIA  REPAIR     inguinal right   HYDROCELE EXCISION     ORIF HUMERUS FRACTURE Right 09/26/2020   Procedure: Open reduction internal fixation right humeral periprosthetic fracture;  Surgeon: Justice Britain, MD;  Location: [facility];  Service: Orthopedics;  Laterality: Right;  171mn   REVERSE SHOULDER ARTHROPLASTY Right 05/07/2016   Procedure: RIGHT REVERSE TOTAL SHOULDER ARTHROPLASTY;  Surgeon: KJustice Britain MD;  Location: [facility]  Service: Orthopedics;  Laterality: Right;    There were no vitals filed for this visit.   Subjective Assessment - 02/03/22 1024     Subjective Pt reports he is doing ok today.    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 No/denies                [facility] - [facility]       Timed Up and Go Test   TUG Comments with SPC 23.34 sec; 23.98 sec w/o AD            Rationale for  Evaluation and Treatment Rehabilitation                [facility] Adult PT Treatment/Exercise - 02/03/22 0001       Ambulation/Gait   Ambulation Distance (Feet) 180 Feet ( 1 rest break in between at ~90 ft)   Assistive device None    Gait Pattern Decreased dorsiflexion - left , L hip abducted     Lumbar Exercises: Aerobic   Nustep L6 x 6 min      Lumbar Exercises: Standing   Other Standing Lumbar Exercises star excursion all the way around 2 trials with SPC for support 6 stars; LOB x 2     Lumbar Exercises: Seated   Sit to Stand 10 reps   with weight ball and chest press on stance; second set with OHP                      PT Short Term Goals - 12/23/21 1127       PT SHORT TERM GOAL #1   Title Patient will be independent with initial HEP including [place] Fall  Prevention Program at a supported level    Status Achieved   12/23/21 - independent with highlighted exercises/activities but admits to limited compliance due to poor motivation   Target Date 12/17/21               PT Long Term Goals - 02/03/22 1034       PT LONG TERM GOAL #1   Title Patient will be independent with ongoing/advanced HEP +/- [place] Fall Prevention Program for self-management at home    Status On-going   01/06/22 - Pt denies need for review of [place] program but admits to not completing the exercises 3x/wk as recommended   Target Date 02/17/22      PT LONG TERM GOAL #2   Title Patient will demonstrate improved B proximal LE strength to >/= 4+/5 for improved stability and ease of mobility    Status Partially Met    Target Date 02/17/22      PT LONG TERM GOAL #3   Title Patient will improve Berg score to >/= 45/56 to improve safety and stability with ADLs in standing and reduce risk for falls    Baseline 34/56 (11/26/21); 41/56 (01/01/22)    Status On-going    Target Date 02/17/22      PT LONG TERM GOAL #4   Title Patient will demonstrate decreased TUG time to </= 18 sec to decrease risk for falls with transitional mobility    Baseline 25.62 sec with SPC, 27.44 sec w/o AD (11/26/21);  27.21 sec with SPC; 29.41 sec w/o AD (01/01/22)    Status On-going   see flowsheet under TUG; 02/03/22   Target Date 02/17/22      PT LONG TERM GOAL #5   Title Patient will ambulate with improved gait pattern and good gait stability with LRAD    Status On-going   01/01/22- pt shows somewhat improved gait pattern with improved gait speed to 1.96 ft/sec, but inconsistent with several bouts of instability and/or minor LOB still evident   Target Date 02/17/22      PT LONG TERM GOAL #6   Title Patient will improve DGI score to >/= 16/24 to improve gait stability and reduce risk for falls    Baseline 7/24 (12/02/21); 10/24 (01/01/22)    Status On-going    Target Date 02/17/22  Plan - 02/03/22 1104     Clinical Impression Statement Pt shows improvement in TUG time and not much difference with or w/o his cane. He still has a hard time getting enough DF on swing with gait. We walked w/o his AD to progress ambulation. He had some instances of LOB with the star excursion going backwards and forward with L LE. Pt showed a good response to treatment and would continue to benefit from balance and strengthening to improve function.    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, R reverse TSA in 04/2016 s/p fall, R UE periprosthetic fracture s/p ORIF in 09/2020 d/t fall, DM, HTN, hammer toes, GERD    PT Frequency 2x / week    PT Duration 6 weeks    PT Treatment/Interventions ADLs/Self Care Home Management;DME Instruction;Gait training;Stair training;Functional mobility training;Therapeutic activities;Therapeutic exercise;Balance training;Neuromuscular re-education;Patient/family education;Manual techniques    PT Next Visit Plan Progress core/LE strengthening; Gait training to normalized gait pattern; Static balance; Dynamic stepping/balance Review/update [place] fall prevention program PRN    PT Home Exercise Plan [place] Fall Prevention Program    Consulted and Agree with Plan of Care Patient             Patient will benefit from skilled therapeutic intervention in order to improve the following deficits and impairments:  Abnormal gait, Decreased activity tolerance, Decreased balance, Decreased endurance, Decreased knowledge of precautions, Decreased knowledge of use of DME, Decreased mobility, Decreased safety awareness, Decreased strength, Impaired perceived functional ability, Improper body mechanics, Postural dysfunction, Pain  Visit Diagnosis: Unsteadiness on feet  Other abnormalities of  gait and mobility  History of falling  Muscle weakness (generalized)     Problem List Patient Active Problem List   Diagnosis Date Noted   Acute kidney injury ([place]) 01/31/2021   Right ureteral stone 01/31/2021   Essential hypertension 01/31/2021   GERD without esophagitis 01/31/2021   Type 2 diabetes mellitus with complication, without long-term current use of insulin ([place]) 01/31/2021   Mixed diabetic hyperlipidemia associated with type 2 diabetes mellitus ([place]) 01/31/2021   Acute renal failure (ARF) ([place]) 01/31/2021   S/P ORIF (open reduction internal fixation) fracture 09/26/2020   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    Artist Pais, PTA 02/03/2022, 11:23 AM  [facility] [address]  [place], [place], [address] Phone: [phone]   Fax:  [phone]  Name: [name] Soto MRN: [id number] Date of Birth: [birth year]

## 2022-02-05 ENCOUNTER — Encounter: Payer: Self-pay | Admitting: Physical Therapy

## 2022-02-05 ENCOUNTER — Ambulatory Visit: Payer: Medicare Other | Admitting: Physical Therapy

## 2022-02-05 DIAGNOSIS — Z9181 History of falling: Secondary | ICD-10-CM

## 2022-02-05 DIAGNOSIS — R2689 Other abnormalities of gait and mobility: Secondary | ICD-10-CM

## 2022-02-05 DIAGNOSIS — R2681 Unsteadiness on feet: Secondary | ICD-10-CM | POA: Diagnosis not present

## 2022-02-05 DIAGNOSIS — M6281 Muscle weakness (generalized): Secondary | ICD-10-CM

## 2022-02-05 NOTE — Therapy (Signed)
[facility] [address]  [place], [place], [address] Phone: [phone]   Fax:  [phone]  Physical Therapy Treatment  Patient Details  Name: [name] Soto MRN: [id number] Date of Birth: 05-14-1946 Referring Provider (PT): Karl Luke, MD   Encounter Date: 02/05/2022   PT End of Session - 02/05/22 1018     Visit Number 18    Number of Visits 23    Date for PT Re-Evaluation 02/17/22    Authorization Type Medicare & BCBS    Progress Note Due on Visit 21    PT Start Time 1018    PT Stop Time 1101    PT Time Calculation (min) 43 min    Activity Tolerance Patient tolerated treatment well    Behavior During Therapy WFL for tasks assessed/performed             Past Medical History:  Diagnosis Date   At risk for falls    Balance problem    Cerebral thrombosis with cerebral infarction ([place])    Diabetes mellitus without complication ([place])    GERD (gastroesophageal reflux disease)    History of kidney stones    Humerus fracture    PROXIMAL RIGHT   Hypertension    Nausea and vomiting    Pancreatic lesion    PONV (postoperative nausea and vomiting)    Stroke ([place]) 2012    Past Surgical History:  Procedure Laterality Date   CATARACT EXTRACTION Bilateral    CYSTOSCOPY WITH RETROGRADE PYELOGRAM, URETEROSCOPY AND STENT PLACEMENT Right 01/31/2021   Procedure: CYSTOSCOPY WITH RIGHT  RETROGRADE URETEROSCOPY HOLMIUM LASER AND STENT PLACEMENT;  Surgeon: Alexis Frock, MD;  [facility];  Service: Urology;  Laterality: Right;   EUS N/A 07/17/2015   Procedure: ESOPHAGEAL ENDOSCOPIC ULTRASOUND (EUS) RADIAL;  Surgeon: Arta Silence, MD;  [facility];  Service: Endoscopy;  Laterality: N/A;   FINE NEEDLE ASPIRATION N/A 07/17/2015   Procedure: FINE NEEDLE ASPIRATION (FNA) RADIAL;  Surgeon: Arta Silence, MD;  [facility];  Service: Endoscopy;  Laterality: N/A;   HEMATOMA DRAINED     HERNIA  REPAIR     inguinal right   HYDROCELE EXCISION     ORIF HUMERUS FRACTURE Right 09/26/2020   Procedure: Open reduction internal fixation right humeral periprosthetic fracture;  Surgeon: Justice Britain, MD;  [facility];  Service: Orthopedics;  Laterality: Right;  123mn   REVERSE SHOULDER ARTHROPLASTY Right 05/07/2016   Procedure: RIGHT REVERSE TOTAL SHOULDER ARTHROPLASTY;  Surgeon: KJustice Britain MD;  Location: [facility]  Service: Orthopedics;  Laterality: Right;    There were no vitals filed for this visit.   Subjective Assessment - 02/05/22 1022     Subjective Pt reports he is doing his HEP "some" but not as much as he probably should.    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 No/denies                               [facility] - [facility]       Ambulation/Gait   Ambulation/Gait Assistance  5: Supervision    Ambulation Distance (Feet) 200 Feet    Assistive device Straight cane    Gait Pattern Decreased hip/knee flexion - left;Decreased hip/knee flexion - right;Decreased dorsiflexion - left;Poor foot clearance - left;Poor foot clearance - right    Ambulation Surface Level;Indoor      Lumbar Exercises: Stretches   Press photographer Right;Left;3 reps;30 seconds    Gastroc Stretch Limitations runner's stretch at counter      Lumbar Exercises: Aerobic   Nustep L6 x 6 min      Lumbar Exercises: Standing   Heel Raises 20 reps;2 seconds    Heel Raises Limitations heel-toe raises      Knee/Hip Exercises: Standing   Hip Flexion Both;2 sets;10 reps;Stengthening;Knee bent    Hip Flexion Limitations march with red TB at mid/forefoot - cues for DF with march    UE support on counter and back of chair   Forward Lunges Right;Left;10 reps;3 seconds    Forward Lunges Limitations staggered squat   UE support on counter and back of chair   Other Standing Knee Exercises alt toe clears with heel tap to 9" step x 10 with UE support on counter & [facility]                       PT Short Term Goals - 12/23/21 1127       PT SHORT TERM GOAL #1   Title Patient will be independent with initial HEP including [place] Prevention Program at a supported level    Status Achieved   12/23/21 - independent with highlighted exercises/activities but admits to limited compliance due to poor motivation   Target Date 12/17/21               PT Long Term Goals - 02/03/22 1034       PT LONG TERM GOAL #1   Title Patient will be independent with ongoing/advanced HEP +/- [place] Fall Prevention Program for self-management at home    Status On-going   01/06/22 - Pt denies need for review of [place] program but admits to not completing the exercises 3x/wk as recommended   Target Date 02/17/22      PT LONG TERM GOAL #2   Title Patient will demonstrate improved B proximal LE strength to >/= 4+/5 for improved stability and ease of mobility    Status Partially Met    Target Date 02/17/22      PT LONG TERM GOAL #3   Title Patient will improve Berg score to >/= 45/56 to improve safety and stability with ADLs in standing and reduce risk for falls    Baseline 34/56 (11/26/21); 41/56 (01/01/22)    Status On-going    Target Date 02/17/22      PT LONG TERM GOAL #4   Title Patient will demonstrate decreased TUG time to </= 18 sec to decrease risk for falls with transitional mobility    Baseline 25.62 sec with SPC, 27.44 sec w/o AD (11/26/21);  27.21 sec with SPC; 29.41 sec w/o AD (01/01/22)    Status On-going   see flowsheet under TUG; 02/03/22   Target Date 02/17/22      PT LONG TERM GOAL #5   Title Patient will ambulate with improved gait pattern and good gait stability  with LRAD    Status On-going   01/01/22- pt shows somewhat improved gait pattern with improved gait speed to 1.96 ft/sec, but inconsistent with several bouts of instability and/or minor LOB  still evident   Target Date 02/17/22      PT LONG TERM GOAL #6   Title Patient will improve DGI score to >/= 16/24 to improve gait stability and reduce risk for falls    Baseline 7/24 (12/02/21); 10/24 (01/01/22)    Status On-going    Target Date 02/17/22                   Plan - 02/05/22 1024     Clinical Impression Statement [name] Soto continues to report limited compliance with HEP, admitting to completing exercises less than prescribed (3x/wk) but evading inquiry to determine actual frequency of HEP performance - reinforced need for good carryover at home to maintain gains achieved with PT. Requested pt bring his HEP/[place] booklet with him for final review next visit. Therapeutic exercises and activities today focusing on improving foot/toe clearance during gait, including stretches for heelcord flexibility to improve available DF ROM as well as hip flexor/DF strengthening to encourage increased hip and knee flexion and promote heel strike on weight acceptance during gait. Session concluded with gait training reinforcing increased hip and knee flexion with DF to promote improved foot clearance and decreased risk for trip/falls.    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, R reverse TSA in 04/2016 s/p fall, R UE periprosthetic fracture s/p ORIF in 09/2020 d/t fall, DM, HTN, hammer toes, GERD    Rehab Potential Good    PT Frequency 2x / week    PT Duration 6 weeks    PT Treatment/Interventions ADLs/Self Care Home Management;DME Instruction;Gait training;Stair training;Functional mobility training;Therapeutic activities;Therapeutic exercise;Balance training;Neuromuscular re-education;Patient/family education;Manual techniques    PT Next Visit Plan Review/update [place] fall prevention program PRN;  Anticipate transition to HEP with discharge from [place] and Agree with Plan of Care Patient             Patient will benefit from skilled therapeutic intervention in order to improve the following deficits and impairments:  Abnormal gait, Decreased activity tolerance, Decreased balance, Decreased endurance, Decreased knowledge of precautions, Decreased knowledge of use of DME, Decreased mobility, Decreased safety awareness, Decreased strength, Impaired perceived functional ability, Improper body mechanics, Postural dysfunction, Pain  Visit Diagnosis: Unsteadiness on feet  Other abnormalities of gait and mobility  History of falling  Muscle weakness (generalized)   Rationale for Evaluation and Treatment: Rehabilitation   Problem List Patient Active Problem List   Diagnosis Date Noted   Acute kidney injury ([place]) 01/31/2021   Right ureteral stone 01/31/2021   Essential hypertension 01/31/2021   GERD without esophagitis 01/31/2021   Type 2 diabetes mellitus with complication, without long-term current use of insulin ([place]) 01/31/2021   Mixed diabetic hyperlipidemia associated with type 2 diabetes mellitus ([place]) 01/31/2021   Acute renal failure (ARF) ([place]) 01/31/2021   S/P ORIF (open reduction internal fixation) fracture 09/26/2020   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    Percival Spanish, PT 02/05/2022, 1:52 PM  [facility] [address]  [place], [place], [address] Phone: [phone]   Fax:  [phone]  Name: [name] Soto MRN: [id number] Date of Birth: Sep 19, 1945

## 2022-02-10 ENCOUNTER — Ambulatory Visit: Payer: Medicare Other

## 2022-02-11 ENCOUNTER — Other Ambulatory Visit: Payer: Self-pay

## 2022-02-11 ENCOUNTER — Encounter: Payer: Self-pay | Admitting: Physical Therapy

## 2022-02-11 ENCOUNTER — Ambulatory Visit: Payer: Medicare Other | Admitting: Physical Therapy

## 2022-02-11 DIAGNOSIS — R2681 Unsteadiness on feet: Secondary | ICD-10-CM

## 2022-02-11 DIAGNOSIS — M25511 Pain in right shoulder: Secondary | ICD-10-CM

## 2022-02-11 DIAGNOSIS — M25611 Stiffness of right shoulder, not elsewhere classified: Secondary | ICD-10-CM

## 2022-02-11 DIAGNOSIS — M6281 Muscle weakness (generalized): Secondary | ICD-10-CM

## 2022-02-11 DIAGNOSIS — R2689 Other abnormalities of gait and mobility: Secondary | ICD-10-CM

## 2022-02-11 NOTE — Therapy (Addendum)
[facility] [address]  [place], [place], [address] Phone: [phone]   Fax:  [phone]  Physical Therapy Treatment and Discharge Summary  Patient Details  Name: [name] Soto MRN: [id number] Date of Birth: 02-Jan-1946 Referring Provider (PT): Karl Luke, MD   Encounter Date: 02/11/2022   PT End of Session - 02/11/22 0937     Visit Number 19    Date for PT Re-Evaluation 02/17/22    Authorization Type Medicare & BCBS    Progress Note Due on Visit 21    PT Start Time 0937    PT Stop Time 1018    PT Time Calculation (min) 41 min    Activity Tolerance Patient tolerated treatment well;Patient limited by fatigue    Behavior During Therapy [facility] for tasks assessed/performed             Past Medical History:  Diagnosis Date   At risk for falls    Balance problem    Cerebral thrombosis with cerebral infarction ([place])    Diabetes mellitus without complication (HCC)    GERD (gastroesophageal reflux disease)    History of kidney stones    Humerus fracture    PROXIMAL RIGHT   Hypertension    Nausea and vomiting    Pancreatic lesion    PONV (postoperative nausea and vomiting)    Stroke ([place]) 2012    Past Surgical History:  Procedure Laterality Date   CATARACT EXTRACTION Bilateral    CYSTOSCOPY WITH RETROGRADE PYELOGRAM, URETEROSCOPY AND STENT PLACEMENT Right 01/31/2021   Procedure: CYSTOSCOPY WITH RIGHT  RETROGRADE URETEROSCOPY HOLMIUM LASER AND STENT PLACEMENT;  Surgeon: Alexis Frock, MD;  [facility];  Service: Urology;  Laterality: Right;   EUS N/A 07/17/2015   Procedure: ESOPHAGEAL ENDOSCOPIC ULTRASOUND (EUS) RADIAL;  Surgeon: Arta Silence, MD;  [facility];  Service: Endoscopy;  Laterality: N/A;   FINE NEEDLE ASPIRATION N/A 07/17/2015   Procedure: FINE NEEDLE ASPIRATION (FNA) RADIAL;  Surgeon: Arta Silence, MD;  [facility];  Service: Endoscopy;  Laterality: N/A;    HEMATOMA DRAINED     HERNIA REPAIR     inguinal right   HYDROCELE EXCISION     ORIF HUMERUS FRACTURE Right 09/26/2020   Procedure: Open reduction internal fixation right humeral periprosthetic fracture;  Surgeon: Justice Britain, MD;  [facility];  Service: Orthopedics;  Laterality: Right;  189mn   REVERSE SHOULDER ARTHROPLASTY Right 05/07/2016   Procedure: RIGHT REVERSE TOTAL SHOULDER ARTHROPLASTY;  Surgeon: KJustice Britain MD;  [facility]: [facility]  Service: Orthopedics;  Laterality: Right;    There were no vitals filed for this visit.   Subjective Assessment - 02/11/22 0946     Subjective Patient reports he was supposed to bring his OTAGO booklet but he was running late. 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 No/denies                [facility] - 02[facility]22 [facility]       Berg Balance Test   Sit to Stand Able to stand without using hands and stabilize independently    Standing Unsupported Able to  stand safely 2 minutes    Sitting with Back Unsupported but Feet Supported on Floor or Stool Able to sit safely and securely 2 minutes    Stand to Sit Sits safely with minimal use of hands    Transfers Able to transfer safely, minor use of hands    Standing Unsupported with Eyes Closed Able to stand 10 seconds safely    Standing Unsupported with Feet Together Able to place feet together independently and stand for 1 minute with supervision    From Standing, Reach Forward with Outstretched Arm Can reach forward >12 cm safely (5")    From Standing Position, Pick up Object from [place] to pick up shoe safely and easily    From Standing Position, Turn to Look Behind Over each Shoulder Turn sideways only but  maintains balance    Turn 360 Degrees Able to turn 360 degrees safely but slowly    Standing Unsupported, Alternately Place Feet on Step/Stool Needs assistance to keep from falling or unable to try    Standing Unsupported, One Foot in [place] to take small step independently and hold 30 seconds    Standing on One Leg Unable to try or needs assist to prevent fall    Total Score 40    Berg comment: 37-45 = Significant (>80%) fall risk                                Balance Exercises - 02/11/22 0001       OTAGO PROGRAM   Knee Extensor 20 reps    Knee Flexor 10 reps    Hip ABductor 10 reps    Ankle Plantorflexors 20 reps, support    Ankle Dorsiflexors 20 reps, support    Knee Bends 20 reps, support    Backwards Walking Support                PT Education - 02/11/22 1337     Education Details Reviewed OTAGO HEP    Person(s) Educated Patient    Methods Explanation;Demonstration    Comprehension Verbalized understanding;Returned demonstration              PT Short Term Goals - 12/23/21 1127       PT SHORT TERM GOAL #1   Title Patient will be independent with initial HEP including [place] at a supported level    Status Achieved   12/23/21 - independent with highlighted exercises/activities but admits to limited compliance due to poor motivation   Target Date 12/17/21               PT Long Term Goals - 02/11/22 1326       PT LONG TERM GOAL #1   Title Patient will be independent with ongoing/advanced HEP +/- [place] Fall Prevention Program for self-management at home    Status Not Met      PT LONG TERM GOAL #2   Title Patient will demonstrate improved B proximal LE strength to >/= 4+/5 for improved stability and ease of mobility    Status Partially Met      PT LONG TERM GOAL #3   Title Patient will improve Berg score to >/= 45/56 to improve safety and stability with ADLs in standing and reduce risk for falls     Baseline 34/56 (11/26/21); 41/56 (01/01/22) 40/56 (02/11/22)    Status Not Met      PT LONG TERM  GOAL #4   Title Patient will demonstrate decreased TUG time to </= 18 sec to decrease risk for falls with transitional mobility    Baseline 25.62 sec with SPC, 27.44 sec w/o AD (11/26/21);  27.21 sec with SPC; 29.41 sec w/o AD (01/01/22)    Status Not Met      PT LONG TERM GOAL #5   Title Patient will ambulate with improved gait pattern and good gait stability with LRAD    Status Not Met      PT LONG TERM GOAL #6   Status Not Met                   Plan - 02/11/22 1329     Clinical Impression Statement [name] Soto was late to appt today and forgot his OTAGO  booklet for review. We reviewed the program without weights and patient required short intermitent rest breaks to complete. He reports he doesn't do his exercises regularly and that as soon as he sits down he sleeps. PT stressed importance of strengthening if pt wants to safely amb with SPC vs. RW in addition to reminding him of CV benefits of TE and likely increased energy levels. BERG was reassessed and pt actually scored lower than last assessment by one point 40/56 indicating he is still a Significant (>80%) fall risk. Plan to put patient on hold until return of primary PT as he is not compliant with HEP and has plateaued in the clinic. Patient agreed with this plan.    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    PT Home Exercise Plan [facility] Fall Prevention Program     Consulted and Agree with Plan of Care Patient             Patient will benefit from skilled therapeutic intervention in order to improve the following deficits and impairments:  Abnormal gait, Decreased activity tolerance, Decreased balance, Decreased endurance, Decreased knowledge of precautions, Decreased knowledge of use of DME, Decreased mobility, Decreased safety awareness, Decreased strength, Impaired perceived functional ability, Improper body mechanics, Postural dysfunction, Pain  Visit Diagnosis: Unsteadiness on feet  Other abnormalities of gait and mobility  Muscle weakness (generalized)  Stiffness of right shoulder, not elsewhere classified  Acute pain of right shoulder     Problem List Patient Active Problem List   Diagnosis Date Noted   Acute kidney injury ([place]) 01/31/2021   Right ureteral stone 01/31/2021   Essential hypertension 01/31/2021   GERD without esophagitis 01/31/2021   Type 2 diabetes mellitus with complication, without long-term current use of insulin ([place]) 01/31/2021   Mixed diabetic hyperlipidemia associated with type 2 diabetes mellitus ([place]) 01/31/2021   Acute renal failure (ARF) ([place]) 01/31/2021   S/P ORIF (open reduction internal fixation) fracture 09/26/2020   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    [name] Soto, PT 02/11/2022, 1:41 PM  [facility] [address]  [place], [place], [address] Phone: [phone]   Fax:  [phone]  Name: [name] Soto MRN: [id number] Date of Birth: 12-Nov-1945   PHYSICAL THERAPY DISCHARGE SUMMARY  Visits from Start of Care: 19  Current functional level related to goals / functional outcomes: See Above   Remaining deficits: See Above   Education / Equipment: HEP   Patient agrees to discharge. Patient goals were not met. Patient is being discharged due to lack of progress. [name] Soto was encouraged to increase compliance  with HEP to improve his strength and to use a RW with gait to help prevent falls as he continues to be at significant risk for falls. He is reluctant to use any AD other than his SPC.   Madelyn Flavors, PT 02/17/22 9:02 AM

## 2022-02-11 NOTE — Therapy (Signed)
[facility] [address]  [place], [place], [address] Phone: [phone]   Fax:  [phone]  Physical Therapy Treatment  Patient Details  Name: [name] Soto MRN: [id number] Date of Birth: 03-Apr-1946 Referring Provider (PT): Karl Luke, MD   Encounter Date: 02/11/2022   PT End of Session - 02/11/22 0937     Visit Number 19    Date for PT Re-Evaluation 02/17/22    Authorization Type Medicare & BCBS    Progress Note Due on Visit 21    PT Start Time 0937    PT Stop Time 1018    PT Time Calculation (min) 41 min    Activity Tolerance Patient tolerated treatment well;Patient limited by fatigue    Behavior During Therapy [facility] for tasks assessed/performed             Past Medical History:  Diagnosis Date   At risk for falls    Balance problem    Cerebral thrombosis with cerebral infarction ([place])    Diabetes mellitus without complication (HCC)    GERD (gastroesophageal reflux disease)    History of kidney stones    Humerus fracture    PROXIMAL RIGHT   Hypertension    Nausea and vomiting    Pancreatic lesion    PONV (postoperative nausea and vomiting)    Stroke ([place]) 2012    Past Surgical History:  Procedure Laterality Date   CATARACT EXTRACTION Bilateral    CYSTOSCOPY WITH RETROGRADE PYELOGRAM, URETEROSCOPY AND STENT PLACEMENT Right 01/31/2021   Procedure: CYSTOSCOPY WITH RIGHT  RETROGRADE URETEROSCOPY HOLMIUM LASER AND STENT PLACEMENT;  Surgeon: Alexis Frock, MD;  [facility];  Service: Urology;  Laterality: Right;   EUS N/A 07/17/2015   Procedure: ESOPHAGEAL ENDOSCOPIC ULTRASOUND (EUS) RADIAL;  Surgeon: Arta Silence, MD;  [facility];  Service: Endoscopy;  Laterality: N/A;   FINE NEEDLE ASPIRATION N/A 07/17/2015   Procedure: FINE NEEDLE ASPIRATION (FNA) RADIAL;  Surgeon: Arta Silence, MD;  [facility];  Service: Endoscopy;  Laterality: N/A;   HEMATOMA DRAINED     HERNIA  REPAIR     inguinal right   HYDROCELE EXCISION     ORIF HUMERUS FRACTURE Right 09/26/2020   Procedure: Open reduction internal fixation right humeral periprosthetic fracture;  Surgeon: Justice Britain, MD;  [facility];  Service: Orthopedics;  Laterality: Right;  132mn   REVERSE SHOULDER ARTHROPLASTY Right 05/07/2016   Procedure: RIGHT REVERSE TOTAL SHOULDER ARTHROPLASTY;  Surgeon: KJustice Britain MD;  [facility]  Service: Orthopedics;  Laterality: Right;    There were no vitals filed for this visit.   Subjective Assessment - 02/11/22 0946     Subjective Patient reports he was supposed to bring his OTAGO booklet but he was running late. 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 No/denies                [facility] - 02[facility]22 0001       Berg Balance Test   Sit to Stand Able to stand without using hands and stabilize independently    Standing Unsupported Able to stand safely 2  minutes    Sitting with Back Unsupported but Feet Supported on Floor or Stool Able to sit safely and securely 2 minutes    Stand to Sit Sits safely with minimal use of hands    Transfers Able to transfer safely, minor use of hands    Standing Unsupported with Eyes Closed Able to stand 10 seconds safely    Standing Unsupported with Feet Together Able to place feet together independently and stand for 1 minute with supervision    From Standing, Reach Forward with Outstretched Arm Can reach forward >12 cm safely (5")    From Standing Position, Pick up Object from [place] to pick up shoe safely and easily    From Standing Position, Turn to Look Behind Over each Shoulder Turn sideways only but maintains balance    Turn 360  Degrees Able to turn 360 degrees safely but slowly    Standing Unsupported, Alternately Place Feet on Step/Stool Needs assistance to keep from falling or unable to try    Standing Unsupported, One Foot in [place] to take small step independently and hold 30 seconds    Standing on One Leg Unable to try or needs assist to prevent fall    Total Score 40    Berg comment: 37-45 = Significant (>80%) fall risk                                Balance Exercises - 02/11/22 0001       OTAGO PROGRAM   Knee Extensor 20 reps    Knee Flexor 10 reps    Hip ABductor 10 reps    Ankle Plantorflexors 20 reps, support    Ankle Dorsiflexors 20 reps, support                PT Education - 02/11/22 1337     Education Details Reviewed OTAGO HEP    Person(s) Educated Patient    Methods Explanation;Demonstration    Comprehension Verbalized understanding;Returned demonstration              PT Short Term Goals - 12/23/21 1127       PT SHORT TERM GOAL #1   Title Patient will be independent with initial HEP including [place] at a supported level    Status Achieved   12/23/21 - independent with highlighted exercises/activities but admits to limited compliance due to poor motivation   Target Date 12/17/21               PT Long Term Goals - 02/11/22 1326       PT LONG TERM GOAL #1   Title Patient will be independent with ongoing/advanced HEP +/- [place] Fall Prevention Program for self-management at home    Status Not Met      PT LONG TERM GOAL #2   Title Patient will demonstrate improved B proximal LE strength to >/= 4+/5 for improved stability and ease of mobility    Status Partially Met      PT LONG TERM GOAL #3   Title Patient will improve Berg score to >/= 45/56 to improve safety and stability with ADLs in standing and reduce risk for falls    Baseline 34/56 (11/26/21); 41/56 (01/01/22) 40/56 (02/11/22)    Status Not Met      PT LONG  TERM GOAL #4   Title Patient will demonstrate decreased TUG time to </= 18 sec to decrease  risk for falls with transitional mobility    Baseline 25.62 sec with SPC, 27.44 sec w/o AD (11/26/21);  27.21 sec with SPC; 29.41 sec w/o AD (01/01/22)    Status Not Met      PT LONG TERM GOAL #5   Title Patient will ambulate with improved gait pattern and good gait stability with LRAD    Status Not Met      PT LONG TERM GOAL #6   Status Not Met                   Plan - 02/11/22 1329     Clinical Impression Statement [name] Soto was late to appt today and forgot his OTAGO  booklet for review. We reviewed the program without weights and patient required short intermitent rest breaks to complete. He reports he doesn't do his exercises regularly and that as soon as he sits down he sleeps. PT stressed importance of strengthening if pt wants to safely amb with SPC vs. RW in addition to reminding him of CV benefits of TE and likely increased energy levels. BERG was reassessed and pt actually scored lower than last assessment by one point 40/56 indicating he is still a Significant (>80%) fall risk. Plan to put patient on hold until return of primary PT as he is not compliant with HEP and has plateaued in the clinic. Patient agreed with this plan.    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    PT Home Exercise Plan [facility] Fall Prevention Program    Consulted and Agree with Plan of Care Patient             Patient will benefit from  skilled therapeutic intervention in order to improve the following deficits and impairments:  Abnormal gait, Decreased activity tolerance, Decreased balance, Decreased endurance, Decreased knowledge of precautions, Decreased knowledge of use of DME, Decreased mobility, Decreased safety awareness, Decreased strength, Impaired perceived functional ability, Improper body mechanics, Postural dysfunction, Pain  Visit Diagnosis: Unsteadiness on feet  Other abnormalities of gait and mobility  Muscle weakness (generalized)  Stiffness of right shoulder, not elsewhere classified  Acute pain of right shoulder     Problem List Patient Active Problem List   Diagnosis Date Noted   Acute kidney injury ([place]) 01/31/2021   Right ureteral stone 01/31/2021   Essential hypertension 01/31/2021   GERD without esophagitis 01/31/2021   Type 2 diabetes mellitus with complication, without long-term current use of insulin ([place]) 01/31/2021   Mixed diabetic hyperlipidemia associated with type 2 diabetes mellitus ([place]) 01/31/2021   Acute renal failure (ARF) ([place]) 01/31/2021   S/P ORIF (open reduction internal fixation) fracture 09/26/2020   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   Madelyn Flavors, PT 02/11/2022, 1:39 PM  [facility] [address]  [place], [place], [address] Phone: [phone]   Fax:  [phone]  Name: [name] Soto MRN: [id number] Date of Birth: 20-Aug-1946

## 2022-05-01 ENCOUNTER — Other Ambulatory Visit: Payer: Self-pay | Admitting: Gastroenterology

## 2022-05-27 ENCOUNTER — Encounter (HOSPITAL_COMMUNITY): Payer: Self-pay | Admitting: Gastroenterology

## 2022-06-03 ENCOUNTER — Encounter (HOSPITAL_COMMUNITY)
Admission: RE | Disposition: A | Discharge status: Home / Self Care | Payer: Self-pay | Source: Home / Self Care | Attending: Gastroenterology

## 2022-06-03 ENCOUNTER — Ambulatory Visit (HOSPITAL_COMMUNITY): Payer: Medicare Other | Admitting: Certified Registered Nurse Anesthetist

## 2022-06-03 ENCOUNTER — Other Ambulatory Visit: Payer: Self-pay

## 2022-06-03 ENCOUNTER — Ambulatory Visit (HOSPITAL_COMMUNITY)
Admission: RE | Admit: 2022-06-03 | Discharge: 2022-06-03 | Disposition: A | Discharge status: Home / Self Care | Payer: Medicare Other | Attending: Gastroenterology | Admitting: Gastroenterology

## 2022-06-03 DIAGNOSIS — Z539 Procedure and treatment not carried out, unspecified reason: Secondary | ICD-10-CM | POA: Diagnosis present

## 2022-06-03 DIAGNOSIS — K862 Cyst of pancreas: Secondary | ICD-10-CM | POA: Insufficient documentation

## 2022-06-03 SURGERY — CANCELLED PROCEDURE
Anesthesia: Monitor Anesthesia Care

## 2022-06-03 MED ORDER — SODIUM CHLORIDE 0.9 % IV SOLN: INTRAVENOUS | Status: DC

## 2022-06-03 NOTE — Progress Notes (Signed)
Pt with recent admission due to some cardiac related events. Per anesthesia and MD it is in the best interest of the patient at this time that he complete his scheduled cardiac work up and be reschedule once cardiac related issues have been addressed. Patient and his spouse both verbalize understanding. Will discharge.

## 2022-06-03 NOTE — Anesthesia Preprocedure Evaluation (Deleted)
Anesthesia Evaluation  Patient identified by MRN, date of birth, ID band Patient awake    Reviewed: Allergy & Precautions, NPO status , Patient's Chart, lab work & pertinent test results  History of Anesthesia Complications (+) PONV and history of anesthetic complications  Airway Mallampati: II  TM Distance: >3 FB Neck ROM: Full    Dental  (+) Poor Dentition, Dental Advisory Given, Missing, Chipped,    Pulmonary neg pulmonary ROS, former smoker,    Pulmonary exam normal breath sounds clear to auscultation       Cardiovascular hypertension, + Valvular Problems/Murmurs AI  Rhythm:Regular Rate:Normal  Echo 09/2021 Thereis mild concentric left ventricular hypertrophy with normal wall motion, normal systolic function and ejection fraction '60-65%'.  There is mild, Grade I diastolic dysfuunction, with indeterminate left atrial pressure. Left atrial pressure is indeterminate due to 'E/e' > 8 and < 14'  The right ventricle is normal in size and function.  The left atrium is mildly dilated.  There is moderate aortic regurgitation.  There was insufficient TR detected to calculate RV systolic pressure.  The inferior vena cava was not visualized during the exam.     Neuro/Psych negative neurological ROS     GI/Hepatic Neg liver ROS, GERD  ,  Endo/Other  negative endocrine ROSdiabetes  Renal/GU Renal disease     Musculoskeletal negative musculoskeletal ROS (+)   Abdominal   Peds  Hematology negative hematology ROS (+)   Anesthesia Other Findings   Reproductive/Obstetrics                            Anesthesia Physical Anesthesia Plan  ASA: 3  Anesthesia Plan: MAC   Post-op Pain Management: Minimal or no pain anticipated   Induction: Intravenous  PONV Risk Score and Plan: 2 and Propofol infusion, TIVA and Treatment may vary due to age or medical condition  Airway Management Planned: Natural  Airway  Additional Equipment:   Intra-op Plan:   Post-operative Plan:   Informed Consent: I have reviewed the patients History and Physical, chart, labs and discussed the procedure including the risks, benefits and alternatives for the proposed anesthesia with the patient or authorized representative who has indicated his/her understanding and acceptance.     Dental advisory given  Plan Discussed with: CRNA  Anesthesia Plan Comments:         Anesthesia Quick Evaluation

## 2022-08-10 ENCOUNTER — Other Ambulatory Visit: Payer: Self-pay | Admitting: Gastroenterology

## 2022-09-29 ENCOUNTER — Encounter (HOSPITAL_COMMUNITY): Payer: Self-pay | Admitting: Gastroenterology

## 2022-09-30 ENCOUNTER — Other Ambulatory Visit: Payer: Self-pay

## 2022-09-30 ENCOUNTER — Encounter (HOSPITAL_COMMUNITY)
Admission: RE | Disposition: A | Discharge status: Home / Self Care | Payer: Self-pay | Source: Home / Self Care | Attending: Gastroenterology

## 2022-09-30 ENCOUNTER — Encounter (HOSPITAL_COMMUNITY): Payer: Self-pay | Admitting: Gastroenterology

## 2022-09-30 ENCOUNTER — Ambulatory Visit (HOSPITAL_COMMUNITY): Payer: Medicare Other | Admitting: Certified Registered Nurse Anesthetist

## 2022-09-30 ENCOUNTER — Ambulatory Visit (HOSPITAL_COMMUNITY)
Admission: RE | Admit: 2022-09-30 | Discharge: 2022-09-30 | Disposition: A | Discharge status: Home / Self Care | Payer: Medicare Other | Attending: Gastroenterology | Admitting: Gastroenterology

## 2022-09-30 DIAGNOSIS — E119 Type 2 diabetes mellitus without complications: Secondary | ICD-10-CM | POA: Diagnosis not present

## 2022-09-30 DIAGNOSIS — Z87891 Personal history of nicotine dependence: Secondary | ICD-10-CM

## 2022-09-30 DIAGNOSIS — K862 Cyst of pancreas: Secondary | ICD-10-CM | POA: Insufficient documentation

## 2022-09-30 DIAGNOSIS — K219 Gastro-esophageal reflux disease without esophagitis: Secondary | ICD-10-CM | POA: Diagnosis not present

## 2022-09-30 DIAGNOSIS — I10 Essential (primary) hypertension: Secondary | ICD-10-CM

## 2022-09-30 DIAGNOSIS — Z8673 Personal history of transient ischemic attack (TIA), and cerebral infarction without residual deficits: Secondary | ICD-10-CM | POA: Insufficient documentation

## 2022-09-30 DIAGNOSIS — Z7984 Long term (current) use of oral hypoglycemic drugs: Secondary | ICD-10-CM

## 2022-09-30 HISTORY — PX: UPPER ESOPHAGEAL ENDOSCOPIC ULTRASOUND (EUS): SHX6562

## 2022-09-30 HISTORY — PX: ESOPHAGOGASTRODUODENOSCOPY (EGD) WITH PROPOFOL: SHX5813

## 2022-09-30 LAB — GLUCOSE, CAPILLARY
Glucose-Capillary: 166 mg/dL — ABNORMAL HIGH (ref 70–99)
Glucose-Capillary: 157 mg/dL — ABNORMAL HIGH (ref 70–99)

## 2022-09-30 SURGERY — UPPER ESOPHAGEAL ENDOSCOPIC ULTRASOUND (EUS)
Anesthesia: Monitor Anesthesia Care

## 2022-09-30 MED ORDER — PROPOFOL 10 MG/ML IV BOLUS
INTRAVENOUS | Status: DC | PRN
  Administered 2022-09-30: 20 mg via INTRAVENOUS
  Administered 2022-09-30: 30 mg via INTRAVENOUS
  Administered 2022-09-30: 20 mg via INTRAVENOUS

## 2022-09-30 MED ORDER — SODIUM CHLORIDE 0.9 % IV SOLN: INTRAVENOUS | Status: DC

## 2022-09-30 MED ORDER — CIPROFLOXACIN IN D5W 400 MG/200ML IV SOLN
INTRAVENOUS | Status: AC
  Filled 2022-09-30: qty 200

## 2022-09-30 MED ORDER — PROPOFOL 500 MG/50ML IV EMUL
INTRAVENOUS | Status: DC | PRN
  Administered 2022-09-30: 150 ug/kg/min via INTRAVENOUS

## 2022-09-30 MED ORDER — PROPOFOL 500 MG/50ML IV EMUL
INTRAVENOUS | Status: AC
  Filled 2022-09-30: qty 50

## 2022-09-30 MED ORDER — LACTATED RINGERS IV SOLN: Freq: Once | INTRAVENOUS | Status: AC

## 2022-09-30 NOTE — Transfer of Care (Signed)
Immediate Anesthesia Transfer of Care Note  Patient: [name] Soto  Procedure(s) Performed: UPPER ESOPHAGEAL ENDOSCOPIC ULTRASOUND (EUS) (Bilateral) FINE NEEDLE ASPIRATION (FNA) LINEAR (Bilateral)  Patient Location: Endoscopy Unit  Anesthesia Type:MAC  Level of Consciousness: drowsy  Airway & Oxygen Therapy: Patient Spontanous Breathing and Patient connected to face mask  Post-op Assessment: Report given to RN and Post -op Vital signs reviewed and stable  Post vital signs: Reviewed and stable  Last Vitals:  Vitals Value Taken Time  BP    Temp    Pulse    Resp    SpO2      Last Pain:  Vitals:   09/30/22 0819  TempSrc: Tympanic  PainSc: 0-No pain         Complications: No notable events documented.

## 2022-09-30 NOTE — H&P (Signed)
[facility] H/P Note  Chief Complaint: Pancreatic cyst  HPI: [name] Soto is an 77 y.o. male.  Here for EUS for evaluation of slowly growing pancreatic cyst.  No symptoms.  Past Medical History:  Diagnosis Date   At risk for falls    Balance problem    Cerebral thrombosis with cerebral infarction (HCC)    Diabetes mellitus without complication (HCC)    type 2   GERD (gastroesophageal reflux disease)    History of kidney stones    Humerus fracture    PROXIMAL RIGHT   Hypertension    Nausea and vomiting    Pancreatic lesion    PONV (postoperative nausea and vomiting)    Stroke ([place]) 2012    Past Surgical History:  Procedure Laterality Date   CATARACT EXTRACTION Bilateral    CYSTOSCOPY WITH RETROGRADE PYELOGRAM, URETEROSCOPY AND STENT PLACEMENT Right 01/31/2021   Procedure: CYSTOSCOPY WITH RIGHT  RETROGRADE URETEROSCOPY HOLMIUM LASER AND STENT PLACEMENT;  Surgeon: Alexis Frock, MD;  Location: [facility];  Service: Urology;  Laterality: Right;   EUS N/A 07/17/2015   Procedure: ESOPHAGEAL ENDOSCOPIC ULTRASOUND (EUS) RADIAL;  Surgeon: Arta Silence, MD;  Location: [facility];  Service: Endoscopy;  Laterality: N/A;   FINE NEEDLE ASPIRATION N/A 07/17/2015   Procedure: FINE NEEDLE ASPIRATION (FNA) RADIAL;  Surgeon: Arta Silence, MD;  Location: [facility];  Service: Endoscopy;  Laterality: N/A;   HEMATOMA DRAINED     HERNIA REPAIR     inguinal right   HYDROCELE EXCISION     ORIF HUMERUS FRACTURE Right 09/26/2020   Procedure: Open reduction internal fixation right humeral periprosthetic fracture;  Surgeon: Justice Britain, MD;  Location: [facility];  Service: Orthopedics;  Laterality: Right;  142min   REVERSE SHOULDER ARTHROPLASTY Right 05/07/2016   Procedure: RIGHT REVERSE TOTAL SHOULDER ARTHROPLASTY;  Surgeon: Justice Britain, MD;  Location: [facility];  Service: Orthopedics;  Laterality: Right;    Medications Prior to Admission  Medication Sig Dispense Refill   amLODipine  (NORVASC) 10 MG tablet Take 10 mg by mouth every morning.     atorvastatin (LIPITOR) 40 MG tablet Take 40 mg by mouth every evening.     b complex vitamins capsule Take 1 capsule by mouth daily.     Cholecalciferol (VITAMIN D3) 50 MCG (2000 UT) capsule Take 2,000 Units by mouth in the morning.     clopidogrel (PLAVIX) 75 MG tablet Take 1 tablet (75 mg total) by mouth daily. (Patient taking differently: Take 75 mg by mouth every evening.) 21 tablet 0   lisinopril (PRINIVIL,ZESTRIL) 40 MG tablet Take 40 mg by mouth in the morning.     metFORMIN (GLUCOPHAGE) 500 MG tablet Take 500 mg by mouth in the morning and at bedtime.     Multiple Vitamin (MULTIVITAMIN WITH MINERALS) TABS tablet Take 1 tablet by mouth in the morning.     Polyethyl Glycol-Propyl Glycol (SYSTANE) 0.4-0.3 % SOLN Place 1 drop into both eyes 3 (three) times daily as needed (dry/irritated eyes.).     solifenacin (VESICARE) 10 MG tablet Take 10 mg by mouth daily.      Allergies:  Allergies  Allergen Reactions   Metformin Hcl Er Other (See Comments)    "Made him feel ill" with the extended released formulation (patient can tolerate immediate release formulation)    Family History  Problem Relation Age of Onset   Heart disease Neg Hx     Social History:  reports that he has quit smoking. His smoking use included cigarettes.  He has never used smokeless tobacco. He reports that he does not currently use drugs. He reports that he does not drink alcohol.   ROS: As per HPI, all others negative  Blood pressure (!) 168/70, temperature 98.1 F (36.7 C), temperature source Tympanic, resp. rate 19, height 5\' 10"  (1.778 m), weight 90.3 kg, SpO2 97 %. General appearance: NAD HEENT:  [place]/AT, anicteric NECK: Supple CV:  Regular RESP:  No distress ABD:  Soft, non-tender NEURO:  A/O, no encephalopathy  Results for orders placed or performed during the hospital encounter of 09/30/22 (from the past 48 hour(s))  Glucose, capillary      Status: Abnormal   Collection Time: 09/30/22  8:32 AM  Result Value Ref Range   Glucose-Capillary 166 (H) 70 - 99 mg/dL    Comment: Glucose reference range applies only to samples taken after fasting for at least 8 hours.   No results found.  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 Endoscopic ultrasound with possible cyst aspiration. Risks (bleeding, infection, bowel perforation that could require surgery, sedation-related changes in cardiopulmonary systems), benefits (identification and possible treatment of source of symptoms, exclusion of certain causes of symptoms), and alternatives (watchful waiting, radiographic imaging studies, empiric medical treatment) of upper endoscopy with ultrasound and possible cyst aspiration/biopsy (EUS +/- FNA) were explained to patient/family in detail and patient wishes to proceed.   Landry Dyke 09/30/2022, 9:07 AM

## 2022-09-30 NOTE — Discharge Instructions (Signed)
Endoscopic Ultrasound (EUS)  Care After  Please read the instructions outlined below and refer to this sheet in the next few weeks. These discharge instructions provide you with general information on caring for yourself after you leave the hospital. Your doctor may also give you specific instructions. While your treatment has been planned according to the most current medical practices available, unavoidable complications occasionally occur. If you have any problems or questions after discharge, please call Dr. Mahamud Metts ([facility]) at [phone].  HOME CARE INSTRUCTIONS  Activity You may resume your regular activity but move at a slower pace for the next 24 hours.  Take frequent rest periods for the next 24 hours.  Walking will help expel (get rid of) the air and reduce the bloated feeling in your abdomen.  No driving for 24 hours (because of the anesthesia (medicine) used during the test).  You may shower.  Do not sign any important legal documents or operate any machinery for 24 hours (because of the anesthesia used during the test).  Nutrition Drink plenty of fluids.  You may resume your normal diet.  Begin with a light meal and progress to your normal diet.  Avoid alcoholic beverages for 24 hours or as instructed by your caregiver.   Medications You may resume your normal medications unless your caregiver tells you otherwise.  What you can expect today You may experience abdominal discomfort such as a feeling of fullness or "gas" pains.  You may experience a sore throat for 2 to 3 days. This is normal. Gargling with salt water may help this.   SEEK IMMEDIATE MEDICAL CARE IF: You have excessive nausea (feeling sick to your stomach) and/or vomiting.  You have severe abdominal pain and distention (swelling).  You have trouble swallowing.  You have a temperature over 100 F (37.8 C).  You have rectal bleeding or vomiting of blood.   Document Released: 04/14/2004  Document Revised: 05/13/2011 Document Reviewed: 10/26/2007 Exit[employer] Patient Information 2012 [employer].  

## 2022-09-30 NOTE — Anesthesia Postprocedure Evaluation (Signed)
Anesthesia Post Note  Patient: [name] Soto  Procedure(s) Performed: UPPER ESOPHAGEAL ENDOSCOPIC ULTRASOUND (EUS) (Bilateral) FINE NEEDLE ASPIRATION (FNA) LINEAR (Bilateral)     Patient location during evaluation: PACU Anesthesia Type: MAC Level of consciousness: awake and alert Pain management: pain level controlled Vital Signs Assessment: post-procedure vital signs reviewed and stable Respiratory status: spontaneous breathing, nonlabored ventilation, respiratory function stable and patient connected to nasal cannula oxygen Cardiovascular status: stable and blood pressure returned to baseline Postop Assessment: no apparent nausea or vomiting Anesthetic complications: no  No notable events documented.  Last Vitals:  Vitals:   09/30/22 0940 09/30/22 0950  BP: (!) 114/38 (!) 122/46  Pulse: 65 61  Resp: 20 17  Temp: 36.6 C   SpO2: 96% 96%    Last Pain:  Vitals:   09/30/22 0950  TempSrc:   PainSc: 0-No pain                 Daisuke Bailey S

## 2022-09-30 NOTE — Op Note (Signed)
[facility] Patient Name: [name] Soto Procedure Date: 09/30/2022 MRN: [id number] Attending MD: Willis Modena , MD, [id number] Date of Birth: 1946/05/24 CSN: [id number] Age: 77 Admit Type: Outpatient Procedure:                Upper EUS Indications:              Pancreatic cyst on MRCP Providers:                Willis Modena, MD, Carlena Hurl RN, RN,                            Kandice Robinsons, Technician Referring MD:             Dr. Sophronia Simas ([facility]) Medicines:                Monitored Anesthesia Care Complications:            No immediate complications. Estimated Blood Loss:     Estimated blood loss: none. Procedure:                Pre-Anesthesia Assessment:                           - Prior to the procedure, a History and Physical                            was performed, and patient medications and                            allergies were reviewed. The patient's tolerance of                            previous anesthesia was also reviewed. The risks                            and benefits of the procedure and the sedation                            options and risks were discussed with the patient.                            All questions were answered, and informed consent                            was obtained. Prior Anticoagulants: The patient has                            taken Plavix (clopidogrel), last dose was 5 days                            prior to procedure. ASA Grade Assessment: III - A                            patient with severe systemic disease. After  reviewing the risks and benefits, the patient was                            deemed in satisfactory condition to undergo the                            procedure.                           After obtaining informed consent, the endoscope was                            passed under direct vision. Throughout the                            procedure, the patient's  blood pressure, pulse, and                            oxygen saturations were monitored continuously. The                            [id number]) Olympus linear ultrasound scope                            was introduced through the mouth, and advanced to                            the second part of duodenum. The upper EUS was                            accomplished without difficulty. The patient                            tolerated the procedure well. Scope In: Scope Out: Findings:      ENDOSONOGRAPHIC FINDING: :      A multicystic lesion suggestive of a cyst was identified in the       pancreatic tail. It is not in obvious communication with the pancreatic       duct. The lesion measured 30 mm by 28 mm in maximal cross-sectional       diameter. There were a few compartments thickly septated. The outer wall       of the lesion was not seen. There was no associated mass. There was no       internal debris within the fluid-filled cavity. Just as in exam 2016,       the splenic artery and vein course anterior to the cyst in the exact       plane we use for cyst aspiration; thus, again as in 2016, I was unable       to do cyst aspiration for fear of precipitating significant bleeding.      There was no sign of significant endosonographic abnormality in the left       lobe of the liver.      No lymphadenopathy seen.      The region of the celiac plexus and celiac ganglia was visualized and  showed no sign of significant endosonographic abnormality. The vascular       anatomy of the region was normal. Impression:               - A cystic lesion was seen in the pancreatic tail.                           - There was no evidence of significant pathology in                            the left lobe of the liver.                           - No specimens collected. Moderate Sedation:      Not Applicable - Patient had care per Anesthesia. Recommendation:           - Discharge patient  to home (via wheelchair).                           - Resume previous diet today.                           - Resume Plavix (clopidogrel) at prior dose today.                           - Continue present medications.                           - Return to GI clinic in 1 year.                           - There is no utility to repeat EUS for attempted                            cyst aspiration due to safety concerns for                            overlying vasculature, as described above. Options                            include either surgical resection or continued                            watching, but this decision is at surgeon's                            discretion; there is no further role for endoscopic                            ultrasound in this patient's case.                           - Return Dr. Zenia Resides ([facility]) for ongoing management. 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 All rights reserved. The codes documented in this report are preliminary and upon coder review may  be revised to meet current compliance requirements. Arta Silence, MD 09/30/2022 9:49:10 AM This report has been signed electronically. Number of Addenda: 0

## 2022-09-30 NOTE — Anesthesia Preprocedure Evaluation (Signed)
Anesthesia Evaluation  Patient identified by MRN, date of birth, ID band Patient awake    Reviewed: Allergy & Precautions, H&P , NPO status , Patient's Chart, lab work & pertinent test results  Airway Mallampati: II  TM Distance: >3 FB Neck ROM: Full    Dental no notable dental hx.    Pulmonary neg pulmonary ROS, former smoker   Pulmonary exam normal breath sounds clear to auscultation       Cardiovascular hypertension, Normal cardiovascular exam Rhythm:Regular Rate:Normal     Neuro/Psych CVA, Residual Symptoms  negative psych ROS   GI/Hepatic Neg liver ROS,GERD  ,,  Endo/Other  negative endocrine ROSdiabetes, Type 2    Renal/GU negative Renal ROS  negative genitourinary   Musculoskeletal negative musculoskeletal ROS (+)    Abdominal   Peds negative pediatric ROS (+)  Hematology negative hematology ROS (+)   Anesthesia Other Findings   Reproductive/Obstetrics negative OB ROS                             Anesthesia Physical Anesthesia Plan  ASA: 3  Anesthesia Plan: MAC   Post-op Pain Management: Minimal or no pain anticipated   Induction: Intravenous  PONV Risk Score and Plan: 1 and Propofol infusion and Treatment may vary due to age or medical condition  Airway Management Planned: Simple Face Mask  Additional Equipment:   Intra-op Plan:   Post-operative Plan:   Informed Consent: I have reviewed the patients History and Physical, chart, labs and discussed the procedure including the risks, benefits and alternatives for the proposed anesthesia with the patient or authorized representative who has indicated his/her understanding and acceptance.     Dental advisory given  Plan Discussed with: CRNA and Surgeon  Anesthesia Plan Comments:        Anesthesia Quick Evaluation

## 2022-10-03 ENCOUNTER — Encounter (HOSPITAL_COMMUNITY): Payer: Self-pay | Admitting: Gastroenterology

## 2023-01-12 ENCOUNTER — Emergency Department (HOSPITAL_COMMUNITY)
Admission: EM | Admit: 2023-01-12 | Discharge: 2023-01-13 | Disposition: A | Discharge status: Skilled Nursing Facility | Payer: Medicare Other | Attending: Emergency Medicine | Admitting: Emergency Medicine

## 2023-01-12 ENCOUNTER — Encounter (HOSPITAL_COMMUNITY): Payer: Self-pay | Admitting: *Deleted

## 2023-01-12 ENCOUNTER — Other Ambulatory Visit: Payer: Self-pay

## 2023-01-12 ENCOUNTER — Emergency Department (HOSPITAL_COMMUNITY): Payer: Medicare Other

## 2023-01-12 DIAGNOSIS — I959 Hypotension, unspecified: Secondary | ICD-10-CM | POA: Insufficient documentation

## 2023-01-12 DIAGNOSIS — T887XXA Unspecified adverse effect of drug or medicament, initial encounter: Secondary | ICD-10-CM | POA: Insufficient documentation

## 2023-01-12 DIAGNOSIS — F0390 Unspecified dementia without behavioral disturbance: Secondary | ICD-10-CM | POA: Insufficient documentation

## 2023-01-12 DIAGNOSIS — Z7902 Long term (current) use of antithrombotics/antiplatelets: Secondary | ICD-10-CM | POA: Insufficient documentation

## 2023-01-12 DIAGNOSIS — Z79899 Other long term (current) drug therapy: Secondary | ICD-10-CM | POA: Diagnosis not present

## 2023-01-12 DIAGNOSIS — R4182 Altered mental status, unspecified: Secondary | ICD-10-CM | POA: Insufficient documentation

## 2023-01-12 DIAGNOSIS — M25552 Pain in left hip: Secondary | ICD-10-CM

## 2023-01-12 DIAGNOSIS — Z8673 Personal history of transient ischemic attack (TIA), and cerebral infarction without residual deficits: Secondary | ICD-10-CM | POA: Diagnosis not present

## 2023-01-12 DIAGNOSIS — R7402 Elevation of levels of lactic acid dehydrogenase (LDH): Secondary | ICD-10-CM | POA: Diagnosis not present

## 2023-01-12 DIAGNOSIS — R410 Disorientation, unspecified: Secondary | ICD-10-CM | POA: Diagnosis present

## 2023-01-12 DIAGNOSIS — Z7984 Long term (current) use of oral hypoglycemic drugs: Secondary | ICD-10-CM | POA: Insufficient documentation

## 2023-01-12 DIAGNOSIS — T50905A Adverse effect of unspecified drugs, medicaments and biological substances, initial encounter: Secondary | ICD-10-CM | POA: Diagnosis not present

## 2023-01-12 LAB — COMPREHENSIVE METABOLIC PANEL
ALT: 28 U/L (ref 0–44)
AST: 19 U/L (ref 15–41)
Albumin: 2.9 g/dL — ABNORMAL LOW (ref 3.5–5.0)
Alkaline Phosphatase: 54 U/L (ref 38–126)
Anion gap: 8 (ref 5–15)
BUN: 21 mg/dL (ref 8–23)
CO2: 27 mmol/L (ref 22–32)
Calcium: 8.7 mg/dL — ABNORMAL LOW (ref 8.9–10.3)
Chloride: 104 mmol/L (ref 98–111)
Creatinine, Ser: 1.3 mg/dL — ABNORMAL HIGH (ref 0.61–1.24)
GFR, Estimated: 57 mL/min — ABNORMAL LOW (ref >60)
Glucose, Bld: 65 mg/dL — ABNORMAL LOW (ref 70–99)
Potassium: 4 mmol/L (ref 3.5–5.1)
Sodium: 139 mmol/L (ref 135–145)
Total Bilirubin: 0.7 mg/dL (ref 0.3–1.2)
Total Protein: 5.8 g/dL — ABNORMAL LOW (ref 6.5–8.1)

## 2023-01-12 LAB — URINALYSIS, ROUTINE W REFLEX MICROSCOPIC
Bilirubin Urine: NEGATIVE
Glucose, UA: 50 mg/dL — AB
Hgb urine dipstick: NEGATIVE
Ketones, ur: NEGATIVE mg/dL
Leukocytes,Ua: NEGATIVE
Nitrite: NEGATIVE
Protein, ur: NEGATIVE mg/dL
Specific Gravity, Urine: 1.01 (ref 1.005–1.030)
pH: 6 (ref 5.0–8.0)

## 2023-01-12 LAB — CBC WITH DIFFERENTIAL/PLATELET
Abs Immature Granulocytes: 0.03 10*3/uL (ref 0.00–0.07)
Basophils Absolute: 0 10*3/uL (ref 0.0–0.1)
Basophils Relative: 0 %
Eosinophils Absolute: 0.1 10*3/uL (ref 0.0–0.5)
Eosinophils Relative: 1 %
HCT: 40 % (ref 39.0–52.0)
Hemoglobin: 12.9 g/dL — ABNORMAL LOW (ref 13.0–17.0)
Immature Granulocytes: 0 %
Lymphocytes Relative: 13 %
Lymphs Abs: 1.3 10*3/uL (ref 0.7–4.0)
MCH: 29.7 pg (ref 26.0–34.0)
MCHC: 32.3 g/dL (ref 30.0–36.0)
MCV: 92 fL (ref 80.0–100.0)
Monocytes Absolute: 0.6 10*3/uL (ref 0.1–1.0)
Monocytes Relative: 7 %
Neutro Abs: 7.4 10*3/uL (ref 1.7–7.7)
Neutrophils Relative %: 79 %
Platelets: 164 10*3/uL (ref 150–400)
RBC: 4.35 MIL/uL (ref 4.22–5.81)
RDW: 14.8 % (ref 11.5–15.5)
WBC: 9.5 10*3/uL (ref 4.0–10.5)
nRBC: 0 % (ref 0.0–0.2)

## 2023-01-12 LAB — CBG MONITORING, ED: Glucose-Capillary: 76 mg/dL (ref 70–99)

## 2023-01-12 LAB — LACTIC ACID, PLASMA
Lactic Acid, Venous: 2.1 mmol/L (ref 0.5–1.9)
Lactic Acid, Venous: 2 mmol/L (ref 0.5–1.9)

## 2023-01-12 LAB — TROPONIN I (HIGH SENSITIVITY): Troponin I (High Sensitivity): 5 ng/L (ref <18)

## 2023-01-12 MED ORDER — LACTATED RINGERS IV BOLUS
1000.0000 mL | Freq: Once | INTRAVENOUS | Status: AC
  Administered 2023-01-12: 1000 mL via INTRAVENOUS

## 2023-01-12 NOTE — Discharge Instructions (Addendum)
Stop your hydralazine medication.  The test today in the ED were otherwise reassuring.  Follow-up with your doctor to have your blood pressure rechecked.  Consider holding the Megace as well as the baclofen

## 2023-01-12 NOTE — ED Provider Notes (Signed)
[place] EMERGENCY DEPARTMENT AT [facility] Provider Note   CSN: [id number] Arrival date & time: 01/12/23  1322     History {Add pertinent medical, surgical, social history, OB history to HPI:1} Chief Complaint  Patient presents with   Unresponsive Resolved   Hypotension    [name] Soto is a 77 y.o. male.  77 year old male with history of stroke, dementia, hypertension, and hyperlipidemia who presents to the emergency department with altered mental status.  Patient was at [facility] burn at [facility] when he was noticed to be confused.  They checked his blood pressure and it was reportedly in the 60s systolic.  911 was called and when they arrived the patient was altered and had a systolic blood pressure of 80.  He was given 500 cc of fluids and route and then mental status appeared to improve and he was saying that he had pain in his left hip.  No falls.  Patient aphasic so unable to give additional history at this time.  Per EMS the patient's wife says that he occasionally will have low blood pressures due to his hydralazine and it appears that he last received hydralazine this morning at 6 AM and losartan at 8 AM.       Home Medications Prior to Admission medications   Medication Sig Start Date End Date Taking? Authorizing Provider  amLODipine (NORVASC) 10 MG tablet Take 10 mg by mouth every morning.    [provider]  atorvastatin (LIPITOR) 40 MG tablet Take 40 mg by mouth every evening.    [provider]  b complex vitamins capsule Take 1 capsule by mouth daily.    [provider]  Cholecalciferol (VITAMIN D3) 50 MCG (2000 UT) capsule Take 2,000 Units by mouth in the morning.    [provider]  clopidogrel (PLAVIX) 75 MG tablet Take 1 tablet (75 mg total) by mouth daily. Patient taking differently: Take 75 mg by mouth every evening. 07/27/21   Charlynne Pander, MD  lisinopril (PRINIVIL,ZESTRIL) 40 MG tablet Take 40 mg by mouth  in the morning.    [provider]  metFORMIN (GLUCOPHAGE) 500 MG tablet Take 500 mg by mouth in the morning and at bedtime. 07/31/20   [provider]  Multiple Vitamin (MULTIVITAMIN WITH MINERALS) TABS tablet Take 1 tablet by mouth in the morning.    [provider]  Polyethyl Glycol-Propyl Glycol (SYSTANE) 0.4-0.3 % SOLN Place 1 drop into both eyes 3 (three) times daily as needed (dry/irritated eyes.).    [provider]  solifenacin (VESICARE) 10 MG tablet Take 10 mg by mouth daily.    [provider]      Allergies    Metformin hcl er    Review of Systems   Review of Systems  Physical Exam Updated Vital Signs BP 129/82 (BP Location: Right Arm)   Pulse 78   Resp 16   SpO2 99%  Physical Exam Vitals and nursing note reviewed.  Constitutional:      General: He is not in acute distress.    Appearance: He is well-developed.     Comments: Aphasic but tracks and is alert.  Points to his left hip when asked if he is in pain.  HENT:     Head: Normocephalic and atraumatic.     Right Ear: External ear normal.     Left Ear: External ear normal.     Nose: Nose normal.  Eyes:     Extraocular Movements: Extraocular  movements intact.     Conjunctiva/sclera: Conjunctivae normal.     Pupils: Pupils are equal, round, and reactive to light.  Cardiovascular:     Rate and Rhythm: Normal rate and regular rhythm.     Heart sounds: Normal heart sounds.  Pulmonary:     Effort: Pulmonary effort is normal. No respiratory distress.     Breath sounds: Normal breath sounds.  Abdominal:     General: There is no distension.     Palpations: Abdomen is soft. There is no mass.     Tenderness: There is no abdominal tenderness. There is no guarding.  Genitourinary:    Penis: Normal.      Testes: Normal.  Musculoskeletal:     Cervical back: Normal range of motion and neck supple.     Right lower leg: No edema.     Left lower leg: No edema.     Comments: No  effusion or swelling or bruising of the left hip.  Skin:    General: Skin is warm and dry.  Neurological:     Mental Status: He is alert. Mental status is at baseline.     Comments: Globally weak but moving all 4 extremities and no cranial nerve deficits noted  Psychiatric:        Mood and Affect: Mood normal.        Behavior: Behavior normal.     ED Results / Procedures / Treatments   Labs (all labs ordered are listed, but only abnormal results are displayed) Labs Reviewed  COMPREHENSIVE METABOLIC PANEL  CBC WITH DIFFERENTIAL/PLATELET  URINALYSIS, ROUTINE W REFLEX MICROSCOPIC  LACTIC ACID, PLASMA  LACTIC ACID, PLASMA  CBG MONITORING, ED  TROPONIN I (HIGH SENSITIVITY)    EKG None  Radiology No results found.  Procedures Procedures  {Document cardiac monitor, telemetry assessment procedure when appropriate:1}  Medications Ordered in ED Medications  lactated ringers bolus 1,000 mL (has no administration in time range)    ED Course/ Medical Decision Making/ A&P Clinical Course as of 01/12/23 1712  Tue Jan 12, 2023  1520 Additional history obtained per the patient's wife.  Reports that he frequently has hypotension and altered mental status due to the hydralazine.  Says it was given this morning without checking his blood pressure.  When she arrived at 55 AM he was not responding so she had them check his blood pressure and he was hypotensive again.  Says that staff was unsure of if he was altered before this. [RP]  1521 Called [facility] staff (stephanie). Appeared drowsy at 11 am. At noon became unresponsive. Was noted to be hypotensive to 67/38. No medications were given just prior to this. Unsure of the last time someone saw him normal since she got to the facility at 11am. Has ben having some vomiting and has been on a CLD for the past 4 weeks as well.  [RP]    Clinical Course User Index [RP] Rondel Baton, MD   {   Click here for ABCD2, HEART and other  calculatorsREFRESH Note before signing :1}                          Medical Decision Making Amount and/or Complexity of Data Reviewed Labs: ordered. Radiology: ordered.   ***  {Document critical care time when appropriate:1} {Document review of labs and clinical decision tools ie heart score, Chads2Vasc2 etc:1}  {Document your independent review of radiology images, and any outside records:1} {Document your  discussion with family members, caretakers, and with consultants:1} {Document social determinants of health affecting pt's care:1} {Document your decision making why or why not admission, treatments were needed:1} Final Clinical Impression(s) / ED Diagnoses Final diagnoses:  None    Rx / DC Orders ED Discharge Orders     None

## 2023-01-12 NOTE — ED Provider Notes (Signed)
Patient was initially seen by Dr. Jarold Motto.  Please see his note.  Plan was to follow-up on the head CT.  Head CT does not show any acute changes.  Patient has remained stable here.  Will hold off on his hydralazine.   Linwood Dibbles, MD 01/12/23 [phone]

## 2023-01-12 NOTE — ED Notes (Signed)
[facility] called  

## 2023-01-12 NOTE — ED Triage Notes (Addendum)
BIB EMS due to unresponsive episode with hypotension 82/40 after fluids bolus 127/60 Wife states after hydralazine he has had episodes like this. Pt from [facility] at [place]. Pt c/o of left thigh pain on arrival to ED. #20 R AC 74-CBG 135  25 mg Hydralazine at 0600

## 2023-01-13 NOTE — ED Notes (Signed)
[facility] here to take patient back to facility. Patient taken out of ED via stretcher.

## 2023-03-09 ENCOUNTER — Other Ambulatory Visit (HOSPITAL_BASED_OUTPATIENT_CLINIC_OR_DEPARTMENT_OTHER): Payer: Self-pay | Admitting: Surgery

## 2023-03-09 DIAGNOSIS — K862 Cyst of pancreas: Secondary | ICD-10-CM

## 2024-01-24 ENCOUNTER — Telehealth: Payer: Self-pay

## 2024-01-24 NOTE — Telephone Encounter (Signed)
 Received PT recert for rehab. Pt is not a pt at the [facility] office. Faxed to PCP on file

## 2024-02-08 ENCOUNTER — Telehealth: Payer: Self-pay

## 2024-02-08 NOTE — Telephone Encounter (Signed)
 [name] Soto
